# Patient Record
Sex: Female | Born: 1935
Health system: Southern US, Community
[De-identification: ages and names within clinical notes are randomized; demographics above are authoritative.]

## PROBLEM LIST (undated history)

## (undated) DIAGNOSIS — E039 Hypothyroidism, unspecified: Secondary | ICD-10-CM

## (undated) DIAGNOSIS — C55 Malignant neoplasm of uterus, part unspecified: Secondary | ICD-10-CM

## (undated) DIAGNOSIS — K219 Gastro-esophageal reflux disease without esophagitis: Secondary | ICD-10-CM

## (undated) DIAGNOSIS — J189 Pneumonia, unspecified organism: Secondary | ICD-10-CM

## (undated) DIAGNOSIS — K635 Polyp of colon: Secondary | ICD-10-CM

## (undated) DIAGNOSIS — Z923 Personal history of irradiation: Secondary | ICD-10-CM

## (undated) HISTORY — DX: Gastro-esophageal reflux disease without esophagitis: K21.9

## (undated) HISTORY — DX: Polyp of colon: K63.5

## (undated) HISTORY — PX: THYROID SURGERY: SHX805

## (undated) HISTORY — DX: Malignant neoplasm of uterus, part unspecified: C55

## (undated) HISTORY — PX: BREAST LUMPECTOMY: SHX2

## (undated) HISTORY — PX: COLONOSCOPY: SHX174

## (undated) HISTORY — PX: HEMORRHOID SURGERY: SHX153

## (undated) HISTORY — DX: Hypothyroidism, unspecified: E03.9

## (undated) HISTORY — PX: EYE SURGERY: SHX253

## (undated) HISTORY — DX: Personal history of irradiation: Z92.3

## (undated) HISTORY — DX: Pneumonia, unspecified organism: J18.9

---

## 1987-08-18 HISTORY — PX: HYSTERECTOMY ABDOMINAL WITH SALPINGECTOMY: SHX6725

## 1999-12-23 ENCOUNTER — Other Ambulatory Visit: Admission: RE | Admit: 1999-12-23 | Discharge: 1999-12-23 | Payer: Self-pay | Admitting: Obstetrics and Gynecology

## 2003-08-01 ENCOUNTER — Emergency Department (HOSPITAL_COMMUNITY): Admission: EM | Admit: 2003-08-01 | Discharge: 2003-08-01 | Payer: Self-pay | Admitting: Emergency Medicine

## 2004-02-07 ENCOUNTER — Encounter: Admission: RE | Admit: 2004-02-07 | Discharge: 2004-02-07 | Payer: Self-pay | Admitting: Family Medicine

## 2004-09-15 ENCOUNTER — Observation Stay (HOSPITAL_COMMUNITY): Admission: EM | Admit: 2004-09-15 | Discharge: 2004-09-16 | Payer: Self-pay | Admitting: Emergency Medicine

## 2011-05-18 ENCOUNTER — Encounter: Payer: Self-pay | Admitting: Gastroenterology

## 2011-05-21 ENCOUNTER — Ambulatory Visit (INDEPENDENT_AMBULATORY_CARE_PROVIDER_SITE_OTHER): Payer: Medicare Other | Admitting: Gastroenterology

## 2011-05-21 ENCOUNTER — Encounter: Payer: Self-pay | Admitting: Gastroenterology

## 2011-05-21 VITALS — BP 126/70 | HR 72 | Ht 65.0 in | Wt 148.4 lb

## 2011-05-21 DIAGNOSIS — R1310 Dysphagia, unspecified: Secondary | ICD-10-CM

## 2011-05-21 DIAGNOSIS — R197 Diarrhea, unspecified: Secondary | ICD-10-CM

## 2011-05-21 MED ORDER — PEG-KCL-NACL-NASULF-NA ASC-C 100 G PO SOLR: 1.0000 | Freq: Once | ORAL | Status: DC

## 2011-05-21 NOTE — Patient Instructions (Signed)
Endo/Colon Propoful scheduled for 05/25/11 1:30 pm arrive at 12:30 pm on 4th floor [facility] sent to your pharmacy

## 2011-05-21 NOTE — Progress Notes (Signed)
History of Present Illness:  This is a very pleasant 77 year old Caucasian female accompanied by her daughter Harriett Sine today for her interview and exam. This patient is referred from primary care for evaluation of 6 weeks of crampy lower abdominal pain with watery diarrhea but no incontinency, melena, or hematochezia. She has had increased abdominal gas and bloating but no systemic or hepatobiliary symptoms. The patient is being treated for chronic acid reflux with Prilosec 20 mg a day. She has new onset intermittent solid food dysphagia with nausea and vomiting. I have  done several colonoscopies on this patient since 1998 because of recurrent colonic polyposis. Her most recent exam is still in medical records and has been requested. Despite these complaints she's had no anorexia but has lost 8-10 pounds. She denies systemic complaints such as fever, chills, skin rashes, arthritis, or mouth sores. There is no history of Raynaud's phenomenon. I do not think she has had previous endoscopic exam. She does not use alcohol, cigarettes, or NSAIDs. Patient otherwise is in good health except for mild osteoporosis and glaucoma. She apparently has a very sensitive gut allergy to seafood. She denies any symptoms suggestive of enteric infection.Review of lab data from primary care shows normal CBC , metabolic profile, and negative stool culture and negative C. difficile toxin. The patient denies use of antibiotics in the last several years. She has had no history of foreign travel, and there are no sick family members at home.  I have reviewed this patient's present history, medical and surgical past history, allergies and medications.    Past Medical History  Diagnosis Date  . Acid reflux   . Hypothyroidism   . Uterine cancer   . Glaucoma   . Colon polyps   . Pneumonia    Past Surgical History  Procedure Date  . Thyroid surgery     growth removed  . Hemorrhoid surgery   . Cesarean section   . Eye surgery    detached retina  . Breast lumpectomy     2 lumps removed from rt breast    reports that she has never smoked. She has never used smokeless tobacco. She reports that she does not drink alcohol or use illicit drugs. family history includes Heart attack in her brother, mother, and sister and Stroke in her father. Allergies  Allergen Reactions  . Shellfish Allergy   . Sulfa Antibiotics        ROS: The remainder of the 10 point ROS is negative     Physical Exam: General well developed well nourished patient in no acute distress, appearing her stated age Eyes PERRLA, no icterus, fundoscopic exam per opthamologist Skin no lesions noted Neck supple, no adenopathy, no thyroid enlargement, no tenderness Chest clear to percussion and auscultation Heart no significant murmurs, gallops or rubs noted Abdomen no hepatosplenomegaly masses or tenderness, BS normal.  Extremities no acute joint lesions, edema, phlebitis or evidence of cellulitis. Neurologic patient oriented x 3, cranial nerves intact, no focal neurologic deficits noted. Psychological mental status normal and normal affect.  Assessment and plan: Probable microscopic-collagenous colitis an elderly Caucasian female with otherwise negative workup. I have scheduled colonoscopy with random biopsies with propofol sedation because of her age. Also as scheduled endoscopic exam with small bowel biopsy and possible esophageal dilatation per her chronic GERD and probable esophageal stricture. I have changed her to Dexilant 60 mg every morning with standard antireflux maneuvers. With her history of colonic polyposis, this also would be indication for followup colonoscopy. If  her workup otherwise is negative, we will persue  malabsorption evaluation. As part of this workup we will exclude occult celiac disease. The patient is of [place] descent. Encounter Diagnoses  Name Primary?  . Diarrhea Yes  . Dysphagia

## 2011-05-25 ENCOUNTER — Encounter: Payer: Self-pay | Admitting: Gastroenterology

## 2011-05-25 ENCOUNTER — Ambulatory Visit (AMBULATORY_SURGERY_CENTER): Payer: Medicare Other | Admitting: Gastroenterology

## 2011-05-25 DIAGNOSIS — Z1211 Encounter for screening for malignant neoplasm of colon: Secondary | ICD-10-CM

## 2011-05-25 DIAGNOSIS — K219 Gastro-esophageal reflux disease without esophagitis: Secondary | ICD-10-CM

## 2011-05-25 DIAGNOSIS — K449 Diaphragmatic hernia without obstruction or gangrene: Secondary | ICD-10-CM | POA: Insufficient documentation

## 2011-05-25 DIAGNOSIS — R197 Diarrhea, unspecified: Secondary | ICD-10-CM

## 2011-05-25 DIAGNOSIS — R1310 Dysphagia, unspecified: Secondary | ICD-10-CM

## 2011-05-25 DIAGNOSIS — K222 Esophageal obstruction: Secondary | ICD-10-CM

## 2011-05-25 MED ORDER — SODIUM CHLORIDE 0.9 % IV SOLN: 500.0000 mL | INTRAVENOUS | Status: DC

## 2011-05-25 NOTE — Patient Instructions (Signed)
Green and blue discharge instructions reviewed with patient and care partner.  Impressions/recommendations:  Endoscopy:  Polyps Hiatal hernia (handout given) Chronic GERD (handout given) Peptic stricture dialated (handout given)  Colonoscopy:  Diverticulosis (handout given) High fiber diet (handout given) Hemorrhoids (handout given)  Resume medications as you had been taking them prior to your procedure.

## 2011-05-26 ENCOUNTER — Telehealth: Payer: Self-pay

## 2011-05-26 ENCOUNTER — Encounter: Payer: Self-pay | Admitting: Gastroenterology

## 2011-05-26 NOTE — Telephone Encounter (Signed)

## 2011-08-14 ENCOUNTER — Other Ambulatory Visit: Payer: Self-pay | Admitting: Dermatology

## 2011-10-18 DIAGNOSIS — H4050X0 Glaucoma secondary to other eye disorders, unspecified eye, stage unspecified: Secondary | ICD-10-CM | POA: Insufficient documentation

## 2011-10-19 DIAGNOSIS — H4050X0 Glaucoma secondary to other eye disorders, unspecified eye, stage unspecified: Secondary | ICD-10-CM | POA: Diagnosis not present

## 2011-12-03 DIAGNOSIS — M719 Bursopathy, unspecified: Secondary | ICD-10-CM | POA: Diagnosis not present

## 2011-12-03 DIAGNOSIS — IMO0002 Reserved for concepts with insufficient information to code with codable children: Secondary | ICD-10-CM | POA: Diagnosis not present

## 2012-02-25 DIAGNOSIS — R42 Dizziness and giddiness: Secondary | ICD-10-CM | POA: Diagnosis not present

## 2012-02-25 DIAGNOSIS — E039 Hypothyroidism, unspecified: Secondary | ICD-10-CM | POA: Diagnosis not present

## 2012-02-25 DIAGNOSIS — K219 Gastro-esophageal reflux disease without esophagitis: Secondary | ICD-10-CM | POA: Diagnosis not present

## 2012-02-25 DIAGNOSIS — M159 Polyosteoarthritis, unspecified: Secondary | ICD-10-CM | POA: Diagnosis not present

## 2012-04-12 DIAGNOSIS — Z8669 Personal history of other diseases of the nervous system and sense organs: Secondary | ICD-10-CM | POA: Insufficient documentation

## 2012-04-12 DIAGNOSIS — H4050X0 Glaucoma secondary to other eye disorders, unspecified eye, stage unspecified: Secondary | ICD-10-CM | POA: Diagnosis not present

## 2012-04-12 DIAGNOSIS — Z9889 Other specified postprocedural states: Secondary | ICD-10-CM | POA: Diagnosis not present

## 2012-05-14 DIAGNOSIS — Z23 Encounter for immunization: Secondary | ICD-10-CM | POA: Diagnosis not present

## 2012-05-24 DIAGNOSIS — M25559 Pain in unspecified hip: Secondary | ICD-10-CM | POA: Diagnosis not present

## 2012-05-24 DIAGNOSIS — M8448XA Pathological fracture, other site, initial encounter for fracture: Secondary | ICD-10-CM | POA: Diagnosis not present

## 2012-10-17 DIAGNOSIS — H4050X0 Glaucoma secondary to other eye disorders, unspecified eye, stage unspecified: Secondary | ICD-10-CM | POA: Diagnosis not present

## 2012-10-17 DIAGNOSIS — Z9889 Other specified postprocedural states: Secondary | ICD-10-CM | POA: Diagnosis not present

## 2013-02-27 DIAGNOSIS — J309 Allergic rhinitis, unspecified: Secondary | ICD-10-CM | POA: Diagnosis not present

## 2013-02-27 DIAGNOSIS — R42 Dizziness and giddiness: Secondary | ICD-10-CM | POA: Diagnosis not present

## 2013-02-27 DIAGNOSIS — E039 Hypothyroidism, unspecified: Secondary | ICD-10-CM | POA: Diagnosis not present

## 2013-02-27 DIAGNOSIS — K219 Gastro-esophageal reflux disease without esophagitis: Secondary | ICD-10-CM | POA: Diagnosis not present

## 2013-02-27 DIAGNOSIS — M159 Polyosteoarthritis, unspecified: Secondary | ICD-10-CM | POA: Diagnosis not present

## 2013-03-05 ENCOUNTER — Emergency Department (HOSPITAL_COMMUNITY): Payer: Medicare Other

## 2013-03-05 ENCOUNTER — Inpatient Hospital Stay (HOSPITAL_COMMUNITY)
Admission: EM | Admit: 2013-03-05 | Discharge: 2013-03-06 | DRG: 690 | Disposition: A | Discharge status: Home / Self Care | Payer: Medicare Other | Attending: Internal Medicine | Admitting: Internal Medicine

## 2013-03-05 ENCOUNTER — Encounter (HOSPITAL_COMMUNITY): Payer: Self-pay | Admitting: Physical Medicine and Rehabilitation

## 2013-03-05 DIAGNOSIS — Z66 Do not resuscitate: Secondary | ICD-10-CM | POA: Diagnosis present

## 2013-03-05 DIAGNOSIS — R5381 Other malaise: Secondary | ICD-10-CM | POA: Diagnosis not present

## 2013-03-05 DIAGNOSIS — J189 Pneumonia, unspecified organism: Secondary | ICD-10-CM

## 2013-03-05 DIAGNOSIS — Z91013 Allergy to seafood: Secondary | ICD-10-CM | POA: Diagnosis not present

## 2013-03-05 DIAGNOSIS — R079 Chest pain, unspecified: Secondary | ICD-10-CM | POA: Diagnosis not present

## 2013-03-05 DIAGNOSIS — H409 Unspecified glaucoma: Secondary | ICD-10-CM | POA: Diagnosis not present

## 2013-03-05 DIAGNOSIS — K219 Gastro-esophageal reflux disease without esophagitis: Secondary | ICD-10-CM | POA: Diagnosis not present

## 2013-03-05 DIAGNOSIS — E039 Hypothyroidism, unspecified: Secondary | ICD-10-CM | POA: Diagnosis present

## 2013-03-05 DIAGNOSIS — R5383 Other fatigue: Secondary | ICD-10-CM | POA: Diagnosis not present

## 2013-03-05 DIAGNOSIS — Z8249 Family history of ischemic heart disease and other diseases of the circulatory system: Secondary | ICD-10-CM | POA: Diagnosis not present

## 2013-03-05 DIAGNOSIS — R269 Unspecified abnormalities of gait and mobility: Secondary | ICD-10-CM | POA: Diagnosis present

## 2013-03-05 DIAGNOSIS — Z8542 Personal history of malignant neoplasm of other parts of uterus: Secondary | ICD-10-CM | POA: Diagnosis not present

## 2013-03-05 DIAGNOSIS — Z79899 Other long term (current) drug therapy: Secondary | ICD-10-CM

## 2013-03-05 DIAGNOSIS — N390 Urinary tract infection, site not specified: Principal | ICD-10-CM | POA: Diagnosis present

## 2013-03-05 DIAGNOSIS — Z823 Family history of stroke: Secondary | ICD-10-CM | POA: Diagnosis not present

## 2013-03-05 DIAGNOSIS — R55 Syncope and collapse: Secondary | ICD-10-CM | POA: Diagnosis not present

## 2013-03-05 DIAGNOSIS — R0989 Other specified symptoms and signs involving the circulatory and respiratory systems: Secondary | ICD-10-CM | POA: Diagnosis not present

## 2013-03-05 LAB — CBC WITH DIFFERENTIAL/PLATELET
Basophils Relative: 0 % (ref 0–1)
Eosinophils Absolute: 0.1 10*3/uL (ref 0.0–0.7)
Eosinophils Relative: 2 % (ref 0–5)
Hemoglobin: 14.3 g/dL (ref 12.0–15.0)
MCH: 33.1 pg (ref 26.0–34.0)
MCHC: 34.8 g/dL (ref 30.0–36.0)
MCV: 95.1 fL (ref 78.0–100.0)
Monocytes Absolute: 0.6 10*3/uL (ref 0.1–1.0)
Monocytes Relative: 12 % (ref 3–12)
Neutrophils Relative %: 66 % (ref 43–77)

## 2013-03-05 LAB — URINALYSIS, ROUTINE W REFLEX MICROSCOPIC
Glucose, UA: NEGATIVE mg/dL
Nitrite: NEGATIVE
Specific Gravity, Urine: 1.007 (ref 1.005–1.030)
pH: 6.5 (ref 5.0–8.0)

## 2013-03-05 LAB — COMPREHENSIVE METABOLIC PANEL
Albumin: 3.6 g/dL (ref 3.5–5.2)
BUN: 15 mg/dL (ref 6–23)
Calcium: 9.2 mg/dL (ref 8.4–10.5)
Creatinine, Ser: 0.77 mg/dL (ref 0.50–1.10)
GFR calc Af Amer: 85 mL/min — ABNORMAL LOW (ref >90)
Glucose, Bld: 105 mg/dL — ABNORMAL HIGH (ref 70–99)
Potassium: 4.2 mEq/L (ref 3.5–5.1)
Total Protein: 6.6 g/dL (ref 6.0–8.3)

## 2013-03-05 LAB — PRO B NATRIURETIC PEPTIDE: Pro B Natriuretic peptide (BNP): 141.8 pg/mL (ref 0–450)

## 2013-03-05 LAB — URINE MICROSCOPIC-ADD ON

## 2013-03-05 LAB — POCT I-STAT TROPONIN I: Troponin i, poc: 0 ng/mL (ref 0.00–0.08)

## 2013-03-05 MED ORDER — CALTRATE 600+D PLUS MINERALS 600-800 MG-UNIT PO TABS: 1.0000 | ORAL_TABLET | Freq: Every day | ORAL | Status: DC

## 2013-03-05 MED ORDER — TRAVOPROST 0.004 % OP SOLN: 1.0000 [drp] | Freq: Every day | OPHTHALMIC | Status: DC

## 2013-03-05 MED ORDER — ENOXAPARIN SODIUM 30 MG/0.3ML ~~LOC~~ SOLN
30.0000 mg | SUBCUTANEOUS | Status: DC
  Administered 2013-03-05: 30 mg via SUBCUTANEOUS
  Filled 2013-03-05 (×2): qty 0.3

## 2013-03-05 MED ORDER — DEXTROSE 5 % IV SOLN
1.0000 g | INTRAVENOUS | Status: DC
  Administered 2013-03-06: 1 g via INTRAVENOUS
  Filled 2013-03-05: qty 10

## 2013-03-05 MED ORDER — DEXTROSE 5 % IV SOLN
1.0000 g | INTRAVENOUS | Status: DC
  Administered 2013-03-05: 12:00:00 via INTRAVENOUS
  Filled 2013-03-05: qty 10

## 2013-03-05 MED ORDER — CALCIUM CITRATE-VITAMIN D 500-400 MG-UNIT PO CHEW
1.0000 | CHEWABLE_TABLET | Freq: Every day | ORAL | Status: DC
  Filled 2013-03-05: qty 1

## 2013-03-05 MED ORDER — SODIUM CHLORIDE 0.9 % IV SOLN
INTRAVENOUS | Status: DC
  Administered 2013-03-06: via INTRAVENOUS

## 2013-03-05 MED ORDER — TRAVOPROST (BAK FREE) 0.004 % OP SOLN
1.0000 [drp] | Freq: Every day | OPHTHALMIC | Status: DC
  Administered 2013-03-05: 1 [drp] via OPHTHALMIC
  Filled 2013-03-05: qty 2.5

## 2013-03-05 MED ORDER — OMEPRAZOLE MAGNESIUM 20 MG PO TBEC: 20.0000 mg | DELAYED_RELEASE_TABLET | Freq: Every day | ORAL | Status: DC

## 2013-03-05 MED ORDER — OMEPRAZOLE 20 MG PO CPDR
20.0000 mg | DELAYED_RELEASE_CAPSULE | Freq: Every day | ORAL | Status: DC
  Administered 2013-03-06: 20 mg via ORAL
  Filled 2013-03-05: qty 1

## 2013-03-05 MED ORDER — ALUM & MAG HYDROXIDE-SIMETH 200-200-20 MG/5ML PO SUSP: 30.0000 mL | Freq: Four times a day (QID) | ORAL | Status: DC | PRN

## 2013-03-05 MED ORDER — SODIUM CHLORIDE 0.9 % IV SOLN
INTRAVENOUS | Status: DC
  Administered 2013-03-05: 11:00:00 via INTRAVENOUS

## 2013-03-05 MED ORDER — DEXTROSE 5 % IV SOLN
500.0000 mg | INTRAVENOUS | Status: DC
  Administered 2013-03-05: 13:00:00 via INTRAVENOUS
  Filled 2013-03-05: qty 500

## 2013-03-05 MED ORDER — ONDANSETRON HCL 4 MG/2ML IJ SOLN: 4.0000 mg | Freq: Four times a day (QID) | INTRAMUSCULAR | Status: DC | PRN

## 2013-03-05 MED ORDER — LEVOTHYROXINE SODIUM 50 MCG PO TABS
50.0000 ug | ORAL_TABLET | Freq: Every day | ORAL | Status: DC
  Administered 2013-03-06: 50 ug via ORAL
  Filled 2013-03-05 (×2): qty 1

## 2013-03-05 MED ORDER — SODIUM CHLORIDE 0.9 % IV SOLN: INTRAVENOUS | Status: DC

## 2013-03-05 MED ORDER — AZITHROMYCIN 500 MG IV SOLR
500.0000 mg | INTRAVENOUS | Status: DC
  Filled 2013-03-05: qty 500

## 2013-03-05 NOTE — H&P (Signed)
[facility] History and Physical  Tanya Martinez GEX:[id number] DOB: 1923-04-15 DOA: 03/05/2013  Referring physician: EDP PCP: Benita Stabile, MD   Chief Complaint: weak, dizziness  HPI: Tanya Martinez is a 79 y.o. female with h/o hypothyroidism, GERD, Glaucoma was in his usual state of health till this morning. She woke up this morning, just didn't feel right, felt weak and dizzy. This was followed by feeling of nausea and sweating and then came to the ER. In addition she also reports urinary urgency and frequency for last couple of days. Upon evaluation in ER noted to have an abnormal UA and CXR with ? Airspace opacities with chronic interstitial lung disease. Denies any cough, congestion, or shortness of breath.    Review of Systems: The patient denies anorexia, fever, weight loss,, vision loss, decreased hearing, hoarseness, chest pain, syncope, dyspnea on exertion, peripheral edema, balance deficits, hemoptysis, abdominal pain, melena, hematochezia, severe indigestion/heartburn, hematuria, incontinence, genital sores, muscle weakness, suspicious skin lesions, transient blindness, difficulty walking, depression, unusual weight change, abnormal bleeding, enlarged lymph nodes, angioedema, and breast masses.    Past Medical History  Diagnosis Date  . Acid reflux   . Hypothyroidism   . Uterine cancer   . Glaucoma   . Colon polyps   . Pneumonia    Past Surgical History  Procedure Laterality Date  . Thyroid surgery      growth removed  . Hemorrhoid surgery    . Cesarean section    . Eye surgery      detached retina  . Breast lumpectomy      2 lumps removed from rt breast  . Colonoscopy     Social History:  reports that she has never smoked. She has never used smokeless tobacco. She reports that  drinks alcohol. She reports that she does not use illicit drugs. Lives by herself, indepenednt in [facility]  Allergies  Allergen Reactions  . Shellfish Allergy Nausea Only  .  Sulfa Antibiotics Other (See Comments)    "Lips felt numb"    Family History  Problem Relation Age of Onset  . Heart attack Mother   . Heart disease Mother   . Heart attack Brother   . Heart disease Brother   . Heart attack Sister   . Heart disease Sister   . Stroke Father     Prior to Admission medications   Medication Sig Start Date End Date Taking? Authorizing Provider  Calcium Carbonate-Vit D-Min (CALTRATE 600+D PLUS MINERALS) 600-800 MG-UNIT TABS Take 1 tablet by mouth daily at 12 noon.   Yes Historical Provider, MD  levothyroxine (SYNTHROID, LEVOTHROID) 50 MCG tablet Take 50 mcg by mouth daily.     Yes Historical Provider, MD  omeprazole (PRILOSEC OTC) 20 MG tablet Take 20 mg by mouth daily.   Yes Historical Provider, MD  travoprost, benzalkonium, (TRAVATAN) 0.004 % ophthalmic solution Apply 1 drop to eye at bedtime.    Yes Historical Provider, MD   Physical Exam: Filed Vitals:   03/05/13 1055 03/05/13 1057 03/05/13 1202 03/05/13 1328  BP: 126/58 120/58 132/52 129/65  Pulse: 70 71 64 68  Temp:    98.2 F (36.8 C)  TempSrc:    Oral  Resp: 20 15 16 20   Height:    5\' 3"  (1.6 m)  Weight:    70.217 kg (154 lb 12.8 oz)  SpO2: 99% 98% 100% 99%     General: AAOx3, no ndistress  HEENT: PERRLA, EOMI  CVS: S!S2/RRR  Lungs: ctAB  Abd: soft, Nt,  BS present, no flank tenderness  Skin: no rashes or skin breakdown  Musculoskeletal: no edema c/c  Psychiatric: appropriate mood and affecr  Neurologic:non focal  Labs on Admission:  Basic Metabolic Panel:  Recent Labs Lab 03/05/13 1035  NA 138  K 4.2  CL 102  CO2 26  GLUCOSE 105*  BUN 15  CREATININE 0.77  CALCIUM 9.2   Liver Function Tests:  Recent Labs Lab 03/05/13 1035  AST 23  ALT 12  ALKPHOS 48  BILITOT 0.7  PROT 6.6  ALBUMIN 3.6   No results found for this basename: LIPASE, AMYLASE,  in the last 168 hours No results found for this basename: AMMONIA,  in the last 168 hours CBC:  Recent  Labs Lab 03/05/13 1035  WBC 5.1  NEUTROABS 3.4  HGB 14.3  HCT 41.1  MCV 95.1  PLT 171   Cardiac Enzymes: No results found for this basename: CKTOTAL, CKMB, CKMBINDEX, TROPONINI,  in the last 168 hours  BNP (last 3 results)  Recent Labs  03/05/13 1044  PROBNP 141.8   CBG:  Recent Labs Lab 03/05/13 1045  GLUCAP 92    Radiological Exams on Admission: Dg Chest 2 View  03/05/2013   *RADIOLOGY REPORT*  Clinical Data: Pain and dizziness  CHEST - 2 VIEW  Comparison: 09/15/2006  Findings: Heart size appears normal.  There is no pleural effusion identified.  Chronic interstitial coarsening is noted bilaterally. Superimposed interstitial opacities are identified within both lung bases.  IMPRESSION:  1.  Advanced chronic interstitial lung disease. 2.  Suspect superimposed airspace opacities within both lung bases.   Original Report Authenticated By: Signa Kell, M.D.    EKG: Independently reviewed. pending  Assessment/Plan Active Problems:   GERD with stricture   UTI (urinary tract infection)   Pneumonia   1. UTI -start IV Ceftriaxone -Fu urine Cultures -gentle IVF  2. Abnormal CXR -no s/s of pneumonia at this time, but given abnormal CXR and exam will empirically treat with Iv ceftriaxone and azithromycin -repeat CXr in am -check Urine legionella and pneumococcal Ag -chronic interstitial lung disease noted on CXR in 2006 as well  3. GERd: -continue PPI  4. Hypothyroidism -continue synthroid  5. Dizziness/weakness -likely from 1/2 -hydrate, monitor on tele and check EKG   Code Status: DNR Family Communication: d/w pt and daughter at bedside Disposition Plan: home when improved  Time spent:  [facility] Pager [phone]  If 7PM-7AM, please contact night-coverage [email or web address] Password [facility] 03/05/2013, 4:47 PM

## 2013-03-05 NOTE — ED Notes (Signed)
Pt presents to department for evaluation of dizziness and near syncope. States she was at home this morning when she became dizzy, diaphoretic, SOB and weak. States she grabbed onto bed railing and sat on bed. States "I felt like I was being pulled down." denies chest pain. Respirations unlabored. Pt is conscious alert and oriented x4.

## 2013-03-05 NOTE — ED Provider Notes (Signed)
History    CSN: [id number] Arrival date & time 03/05/13  1011  First MD Initiated Contact with Patient 03/05/13 1029     Chief Complaint  Patient presents with  . Dizziness  . Near Syncope   (Consider location/radiation/quality/duration/timing/severity/associated sxs/prior Treatment) The history is provided by the patient.   patient here complaining of sudden onset of dizziness and weakness today. Notes near syncope. Denies any recent illnesses. No associated chest pain chest pressure. Some dyspnea without abdominal pain. No black or bloody stools. No prior history of same. Symptoms worse with standing. No new medication started. No prior history of same. No treatment used prior to arrival Past Medical History  Diagnosis Date  . Acid reflux   . Hypothyroidism   . Uterine cancer   . Glaucoma   . Colon polyps   . Pneumonia    Past Surgical History  Procedure Laterality Date  . Thyroid surgery      growth removed  . Hemorrhoid surgery    . Cesarean section    . Eye surgery      detached retina  . Breast lumpectomy      2 lumps removed from rt breast  . Colonoscopy     Family History  Problem Relation Age of Onset  . Heart attack Mother   . Heart disease Mother   . Heart attack Brother   . Heart disease Brother   . Heart attack Sister   . Heart disease Sister   . Stroke Father    History  Substance Use Topics  . Smoking status: Never Smoker   . Smokeless tobacco: Never Used  . Alcohol Use: Yes     Comment: occasionally   OB History   Grav Para Term Preterm Abortions TAB SAB Ect Mult Living                 Review of Systems  All other systems reviewed and are negative.    Allergies  Shellfish allergy and Sulfa antibiotics  Home Medications   Current Outpatient Rx  Name  Route  Sig  Dispense  Refill  . Calcium Carbonate-Vitamin D (CALCIUM 600+D) 600-400 MG-UNIT per tablet   Oral   Take 1 tablet by mouth daily.           Marland Kitchen levothyroxine  (SYNTHROID, LEVOTHROID) 50 MCG tablet   Oral   Take 50 mcg by mouth daily.           Marland Kitchen omeprazole (PRILOSEC) 20 MG capsule   Oral   Take 20 mg by mouth daily.           . travoprost, benzalkonium, (TRAVATAN) 0.004 % ophthalmic solution   Ophthalmic   Apply 1 drop to eye at bedtime.            There were no vitals taken for this visit. Physical Exam  Nursing note and vitals reviewed. Constitutional: She is oriented to person, place, and time. She appears well-developed and well-nourished.  Non-toxic appearance. No distress.  HENT:  Head: Normocephalic and atraumatic.  Eyes: Conjunctivae, EOM and lids are normal. Pupils are equal, round, and reactive to light.  Neck: Normal range of motion. Neck supple. No tracheal deviation present. No mass present.  Cardiovascular: Normal rate, regular rhythm and normal heart sounds.  Exam reveals no gallop.   No murmur heard. Pulmonary/Chest: Effort normal and breath sounds normal. No stridor. No respiratory distress. She has no decreased breath sounds. She has no wheezes. She has no rhonchi.  She has no rales.  Abdominal: Soft. Normal appearance and bowel sounds are normal. She exhibits no distension. There is no tenderness. There is no rebound and no CVA tenderness.  Musculoskeletal: Normal range of motion. She exhibits no edema and no tenderness.  Neurological: She is alert and oriented to person, place, and time. She has normal strength. No cranial nerve deficit or sensory deficit. GCS eye subscore is 4. GCS verbal subscore is 5. GCS motor subscore is 6.  Skin: Skin is warm and dry. No abrasion and no rash noted.  Psychiatric: She has a normal mood and affect. Her speech is normal and behavior is normal.    ED Course  Procedures (including critical care time) Labs Reviewed  CBC WITH DIFFERENTIAL  COMPREHENSIVE METABOLIC PANEL  URINALYSIS, ROUTINE W REFLEX MICROSCOPIC  PRO B NATRIURETIC PEPTIDE   No results found. No diagnosis  found.  MDM  Pt to be admitted for tx of pneumonia and uti--abx started here  Toy Baker, MD 03/05/13 1218

## 2013-03-05 NOTE — ED Notes (Signed)
CBG- 92 

## 2013-03-06 ENCOUNTER — Inpatient Hospital Stay (HOSPITAL_COMMUNITY): Payer: Medicare Other

## 2013-03-06 DIAGNOSIS — K219 Gastro-esophageal reflux disease without esophagitis: Secondary | ICD-10-CM | POA: Diagnosis not present

## 2013-03-06 DIAGNOSIS — N390 Urinary tract infection, site not specified: Secondary | ICD-10-CM | POA: Diagnosis not present

## 2013-03-06 DIAGNOSIS — R0989 Other specified symptoms and signs involving the circulatory and respiratory systems: Secondary | ICD-10-CM | POA: Diagnosis not present

## 2013-03-06 LAB — CBC
HCT: 37.7 % (ref 36.0–46.0)
Hemoglobin: 12.7 g/dL (ref 12.0–15.0)
MCH: 32.5 pg (ref 26.0–34.0)
MCV: 96.4 fL (ref 78.0–100.0)
RBC: 3.91 MIL/uL (ref 3.87–5.11)

## 2013-03-06 LAB — BASIC METABOLIC PANEL
CO2: 27 mEq/L (ref 19–32)
Calcium: 8.6 mg/dL (ref 8.4–10.5)
Creatinine, Ser: 0.78 mg/dL (ref 0.50–1.10)
Glucose, Bld: 91 mg/dL (ref 70–99)

## 2013-03-06 MED ORDER — ENOXAPARIN SODIUM 40 MG/0.4ML ~~LOC~~ SOLN
40.0000 mg | Freq: Every day | SUBCUTANEOUS | Status: DC
  Administered 2013-03-06: 40 mg via SUBCUTANEOUS
  Filled 2013-03-06: qty 0.4

## 2013-03-06 MED ORDER — CEFPODOXIME PROXETIL 100 MG PO TABS: 200.0000 mg | ORAL_TABLET | Freq: Two times a day (BID) | ORAL | Status: DC

## 2013-03-06 MED ORDER — CALCIUM CARBONATE-VITAMIN D 500-200 MG-UNIT PO TABS
1.0000 | ORAL_TABLET | Freq: Every day | ORAL | Status: DC
  Administered 2013-03-06: 1 via ORAL

## 2013-03-06 MED ORDER — CEFUROXIME AXETIL 250 MG PO TABS: 250.0000 mg | ORAL_TABLET | Freq: Two times a day (BID) | ORAL | Status: DC

## 2013-03-06 NOTE — Discharge Summary (Addendum)
Physician Discharge Summary  [name] AVW:[id number] DOB: [birth year] DOA: 03/05/2013  PCP: Benita Stabile, MD  Admit date: 03/05/2013 Discharge date: 03/06/2013  Time spent: 45 minutes  Recommendations for Outpatient Follow-up:  1. PCP in one week  Discharge Diagnoses:  Active Problems:   GERD with stricture   UTI (urinary tract infection)   Glaucoma   History of gait disorder   Discharge Condition: Stable  Diet recommendation: Regular  Filed Weights   03/05/13 1328  Weight: 70.217 kg (154 lb 12.8 oz)    History of present illness:  Tanya Martinez is a 79 y.o. female with h/o hypothyroidism, GERD, Glaucoma was in his usual state of health till this morning.  She woke up this morning, just didn't feel right, felt weak and dizzy.  This was followed by feeling of nausea and sweating and then came to the ER.  In addition she also reports urinary urgency and frequency for last couple of days.  Upon evaluation in ER noted to have an abnormal UA and CXR with ? Airspace opacities with chronic interstitial lung disease.  Denies any cough, congestion, or shortness of breath.  Hospital Course:  She was admitted to a telemetry floor, started on IV ceftriaxone, azithromycin was added initially due to concern for potential pneumonia on chest x-ray. Subsequent x-ray the next day only showed chronic lung changes which were stable and hence azithromycin was discontinued. She was clinically much improved within 24 hours, urine cultures are still pending at this point, was seen in fluid with physical therapy, discharged home in a stable condition on oral cefpodoxime for UTI. In terms of her chronic stable interstitial lung disease, but has been noted on prior x-rays dating back to 2006, not causing any symptoms at this point,No history of tobacco use. Would not recommend any further workup unless she becomes symptomatic, will follow up chest x-ray in 3 months.  Did not have any arrhythmias  or any abnormal rhythms and monitor or EKG.    Discharge Exam: Filed Vitals:   03/05/13 1328 03/05/13 2129 03/06/13 0536 03/06/13 1407  BP: 129/65 111/55 131/60 113/63  Pulse: 68 62 56 68  Temp: 98.2 F (36.8 C) 98.5 F (36.9 C) 98.1 F (36.7 C) 97.7 F (36.5 C)  TempSrc: Oral Oral Oral Oral  Resp: 20 18 18 20   Height: 5\' 3"  (1.6 m)     Weight: 70.217 kg (154 lb 12.8 oz)     SpO2: 99% 96% 97% 99%    General: AAOx3 Cardiovascular: S1-S2 regular rate rhythm no murmurs rubs or gallops Respiratory: Crackles bilaterally  Discharge Instructions     Medication List         CALTRATE 600+D PLUS MINERALS 600-800 MG-UNIT Tabs  Take 1 tablet by mouth daily at 12 noon.     cefUROXime 250 MG tablet  Commonly known as:  CEFTIN  Take 1 tablet (250 mg total) by mouth 2 (two) times daily. For 3 days     levothyroxine 50 MCG tablet  Commonly known as:  SYNTHROID, LEVOTHROID  Take 50 mcg by mouth daily.     omeprazole 20 MG tablet  Commonly known as:  PRILOSEC OTC  Take 20 mg by mouth daily.     travoprost (benzalkonium) 0.004 % ophthalmic solution  Commonly known as:  TRAVATAN  Apply 1 drop to eye at bedtime.       Allergies  Allergen Reactions  . Shellfish Allergy Nausea Only  . Sulfa Antibiotics Other (See Comments)    "  Lips felt numb"      The results of significant diagnostics from this hospitalization (including imaging, microbiology, ancillary and laboratory) are listed below for reference.    Significant Diagnostic Studies: Dg Chest 2 View  03/06/2013   *RADIOLOGY REPORT*  Clinical Data: Congestion, shortness of breath.  CHEST - 2 VIEW  Comparison: 03/05/2013  Findings: Advanced chronic interstitial changes throughout the lungs.  Probable fibrosis in the lung bases.  Hyperinflation.  No acute opacities or effusions.  Heart is normal size.  Mediastinal contours within normal limits.  No acute bony abnormality.  IMPRESSION: Stable chronic changes.   Original Report  Authenticated By: Charlett Nose, M.D.   Dg Chest 2 View  03/05/2013   *RADIOLOGY REPORT*  Clinical Data: Pain and dizziness  CHEST - 2 VIEW  Comparison: 09/15/2006  Findings: Heart size appears normal.  There is no pleural effusion identified.  Chronic interstitial coarsening is noted bilaterally. Superimposed interstitial opacities are identified within both lung bases.  IMPRESSION:  1.  Advanced chronic interstitial lung disease. 2.  Suspect superimposed airspace opacities within both lung bases.   Original Report Authenticated By: Signa Kell, M.D.    Microbiology: No results found for this or any previous visit (from the past 240 hour(s)).   Labs: Basic Metabolic Panel:  Recent Labs Lab 03/05/13 1035 03/06/13 0535  NA 138 143  K 4.2 3.9  CL 102 108  CO2 26 27  GLUCOSE 105* 91  BUN 15 14  CREATININE 0.77 0.78  CALCIUM 9.2 8.6   Liver Function Tests:  Recent Labs Lab 03/05/13 1035  AST 23  ALT 12  ALKPHOS 48  BILITOT 0.7  PROT 6.6  ALBUMIN 3.6   No results found for this basename: LIPASE, AMYLASE,  in the last 168 hours No results found for this basename: AMMONIA,  in the last 168 hours CBC:  Recent Labs Lab 03/05/13 1035 03/06/13 0535  WBC 5.1 4.8  NEUTROABS 3.4  --   HGB 14.3 12.7  HCT 41.1 37.7  MCV 95.1 96.4  PLT 171 158   Cardiac Enzymes: No results found for this basename: CKTOTAL, CKMB, CKMBINDEX, TROPONINI,  in the last 168 hours BNP: BNP (last 3 results)  Recent Labs  03/05/13 1044  PROBNP 141.8   CBG:  Recent Labs Lab 03/05/13 1045  GLUCAP 92       Signed:  Babette Stum  [facility] 03/06/2013, 2:17 PM

## 2013-03-06 NOTE — Evaluation (Signed)
Physical Therapy Evaluation Patient Details Name: Tanya Martinez MRN: [id number] DOB: [birth year] Today's Date: 03/06/2013 Time: 4098-1191 PT Time Calculation (min): 19 min  PT Assessment / Plan / Recommendation History of Present Illness  Adm due to weakness, nausea, sweating   Clinical Impression  Pt adm due to nausea, dizziness, and weakness. Pt treated for UTI and hydrated. Pt scored 20 on DGI. Is amb at baseline, Mod I at this time. Demo good ability to pick up objects off ground. Pt encouraged to seek out AD if pt notices any unsteadiness or increase in furniture walking at home. Pt and daughter verbalized understanding. Educated family and pt on vestibular OPPT to increase stability if dizziness continues. Pt does not have acute PT needs at this time. Will sign off at this time.   PT Assessment  Patent does not need any further PT services;All further PT needs can be met in the next venue of care    Follow Up Recommendations  Outpatient PT;Other (comment) (vestibular OPPT )    Does the patient have the potential to tolerate intense rehabilitation      Barriers to Discharge        Equipment Recommendations  None recommended by PT    Recommendations for Other Services     Frequency      Precautions / Restrictions Precautions Precautions: None Restrictions Weight Bearing Restrictions: No   Pertinent Vitals/Pain No new complaints.       Mobility  Bed Mobility Bed Mobility: Not assessed (pt sitting EOB. ) Transfers Transfers: Stand to Sit;Sit to Stand Sit to Stand: 6: Modified independent (Device/Increase time);From bed Stand to Sit: 6: Modified independent (Device/Increase time);To bed;To chair/3-in-1 Details for Transfer Assistance: pt demo good technique and safetey awareness with transfer bed <> 3 in 1 <> bed Ambulation/Gait Ambulation/Gait Assistance: 5: Supervision;6: Modified independent (Device/Increase time) Ambulation Distance (Feet): 200 Feet Assistive  device: None Ambulation/Gait Assistance Details: supervision only for safety with dynamic gait activities; pt Mod I for basic gt; demo good stability; states she is walking at her baseline; no AD needed at this time  Gait Pattern: Step-through pattern Gait velocity: WNL  Stairs: Yes Stairs Assistance: 5: Supervision;6: Modified independent (Device/Increase time) Stairs Assistance Details (indicate cue type and reason): pt supervision initally for safety demo good technique with handrail and progressed to mod I Stair Management Technique: One rail Right;Step to pattern;Forwards Number of Stairs: 2 Wheelchair Mobility Wheelchair Mobility: No    Exercises     PT Diagnosis: Difficulty walking  PT Problem List: Decreased balance PT Treatment Interventions:       PT Goals(Current goals can be found in the care plan section)    Visit Information  Last PT Received On: 03/06/13 Assistance Needed: +1 History of Present Illness: Adm due to weakness, nausea, sweating        Prior Functioning  Home Living Family/patient expects to be discharged to:: Private residence Living Arrangements: Alone Available Help at Discharge: Available PRN/intermittently;Family Type of Home: House Home Access: Stairs to enter Secretary/administrator of Steps: 1 Entrance Stairs-Rails: None Home Layout: Two level Alternate Level Stairs-Number of Steps: flight  Alternate Level Stairs-Rails: Right Home Equipment: None Additional Comments: Daughter lives Prior Function Level of Independence: Independent Communication Communication: No difficulties    Cognition  Cognition Arousal/Alertness: Awake/alert Behavior During Therapy: WFL for tasks assessed/performed Overall Cognitive Status: Within Functional Limits for tasks assessed    Extremity/Trunk Assessment Upper Extremity Assessment Upper Extremity Assessment: Overall WFL for tasks assessed Lower Extremity Assessment  Lower Extremity Assessment:  Overall WFL for tasks assessed Cervical / Trunk Assessment Cervical / Trunk Assessment: Normal   Balance Balance Balance Assessed: Yes Static Standing Balance Static Standing - Balance Support: No upper extremity supported;During functional activity Static Standing - Level of Assistance: 6: Modified independent (Device/Increase time) Standardized Balance Assessment Standardized Balance Assessment: Dynamic Gait Index Dynamic Gait Index Level Surface: Normal Change in Gait Speed: Normal Gait with Horizontal Head Turns: Mild Impairment Gait with Vertical Head Turns: Mild Impairment Gait and Pivot Turn: Normal Step Over Obstacle: Mild Impairment Step Around Obstacles: Normal Steps: Mild Impairment Total Score: 20 High Level Balance High Level Balance Activites: Other (comment) High Level Balance Comments: pt able to pick up objects off ground and demo good stability and body mechanics  End of Session PT - End of Session Equipment Utilized During Treatment: Gait belt Activity Tolerance: Patient tolerated treatment well Patient left: in bed;with call bell/phone within reach;with family/visitor present Nurse Communication: Mobility status  GP     Donell Sievert, [place] [phone] 03/06/2013, 1:27 PM

## 2013-03-06 NOTE — Discharge Planning (Signed)
Patient discharged home in stable condition. Verbalizes understanding of all discharge instructions, including home medications and follow up appointments. 

## 2013-03-07 LAB — URINE CULTURE

## 2013-03-14 DIAGNOSIS — R0989 Other specified symptoms and signs involving the circulatory and respiratory systems: Secondary | ICD-10-CM | POA: Diagnosis not present

## 2013-03-14 DIAGNOSIS — N390 Urinary tract infection, site not specified: Secondary | ICD-10-CM | POA: Diagnosis not present

## 2013-03-22 DIAGNOSIS — L259 Unspecified contact dermatitis, unspecified cause: Secondary | ICD-10-CM | POA: Diagnosis not present

## 2013-03-22 DIAGNOSIS — L819 Disorder of pigmentation, unspecified: Secondary | ICD-10-CM | POA: Diagnosis not present

## 2013-03-22 DIAGNOSIS — L570 Actinic keratosis: Secondary | ICD-10-CM | POA: Diagnosis not present

## 2013-05-16 DIAGNOSIS — N390 Urinary tract infection, site not specified: Secondary | ICD-10-CM | POA: Diagnosis not present

## 2013-05-16 DIAGNOSIS — R350 Frequency of micturition: Secondary | ICD-10-CM | POA: Diagnosis not present

## 2013-05-24 ENCOUNTER — Other Ambulatory Visit: Payer: Self-pay | Admitting: Family Medicine

## 2013-05-24 ENCOUNTER — Ambulatory Visit
Admission: RE | Admit: 2013-05-24 | Discharge: 2013-05-24 | Disposition: A | Discharge status: Home / Self Care | Payer: Medicare Other | Source: Ambulatory Visit | Attending: Family Medicine | Admitting: Family Medicine

## 2013-05-24 DIAGNOSIS — N390 Urinary tract infection, site not specified: Secondary | ICD-10-CM

## 2013-06-05 DIAGNOSIS — Z23 Encounter for immunization: Secondary | ICD-10-CM | POA: Diagnosis not present

## 2013-06-19 DIAGNOSIS — H40009 Preglaucoma, unspecified, unspecified eye: Secondary | ICD-10-CM | POA: Diagnosis not present

## 2013-06-19 DIAGNOSIS — H3320 Serous retinal detachment, unspecified eye: Secondary | ICD-10-CM | POA: Diagnosis not present

## 2013-06-19 DIAGNOSIS — Z961 Presence of intraocular lens: Secondary | ICD-10-CM | POA: Diagnosis not present

## 2013-07-07 DIAGNOSIS — L570 Actinic keratosis: Secondary | ICD-10-CM | POA: Diagnosis not present

## 2013-11-05 DIAGNOSIS — N3000 Acute cystitis without hematuria: Secondary | ICD-10-CM | POA: Diagnosis not present

## 2013-11-05 DIAGNOSIS — R110 Nausea: Secondary | ICD-10-CM | POA: Diagnosis not present

## 2013-11-15 DIAGNOSIS — N390 Urinary tract infection, site not specified: Secondary | ICD-10-CM | POA: Diagnosis not present

## 2013-11-15 DIAGNOSIS — H6990 Unspecified Eustachian tube disorder, unspecified ear: Secondary | ICD-10-CM | POA: Diagnosis not present

## 2013-11-29 DIAGNOSIS — N390 Urinary tract infection, site not specified: Secondary | ICD-10-CM | POA: Diagnosis not present

## 2013-12-29 DIAGNOSIS — N390 Urinary tract infection, site not specified: Secondary | ICD-10-CM | POA: Diagnosis not present

## 2014-02-07 DIAGNOSIS — H4050X0 Glaucoma secondary to other eye disorders, unspecified eye, stage unspecified: Secondary | ICD-10-CM | POA: Diagnosis not present

## 2014-02-07 DIAGNOSIS — Z9889 Other specified postprocedural states: Secondary | ICD-10-CM | POA: Diagnosis not present

## 2014-02-27 DIAGNOSIS — E039 Hypothyroidism, unspecified: Secondary | ICD-10-CM | POA: Diagnosis not present

## 2014-02-27 DIAGNOSIS — K219 Gastro-esophageal reflux disease without esophagitis: Secondary | ICD-10-CM | POA: Diagnosis not present

## 2014-02-27 DIAGNOSIS — M159 Polyosteoarthritis, unspecified: Secondary | ICD-10-CM | POA: Diagnosis not present

## 2014-02-27 DIAGNOSIS — Z23 Encounter for immunization: Secondary | ICD-10-CM | POA: Diagnosis not present

## 2014-02-27 DIAGNOSIS — R42 Dizziness and giddiness: Secondary | ICD-10-CM | POA: Diagnosis not present

## 2014-04-25 DIAGNOSIS — D2360 Other benign neoplasm of skin of unspecified upper limb, including shoulder: Secondary | ICD-10-CM | POA: Diagnosis not present

## 2014-04-25 DIAGNOSIS — L819 Disorder of pigmentation, unspecified: Secondary | ICD-10-CM | POA: Diagnosis not present

## 2014-04-25 DIAGNOSIS — D235 Other benign neoplasm of skin of trunk: Secondary | ICD-10-CM | POA: Diagnosis not present

## 2014-05-24 DIAGNOSIS — Z23 Encounter for immunization: Secondary | ICD-10-CM | POA: Diagnosis not present

## 2014-08-14 DIAGNOSIS — J189 Pneumonia, unspecified organism: Secondary | ICD-10-CM | POA: Diagnosis not present

## 2014-08-28 ENCOUNTER — Ambulatory Visit
Admission: RE | Admit: 2014-08-28 | Discharge: 2014-08-28 | Disposition: A | Discharge status: Home / Self Care | Payer: Medicare Other | Source: Ambulatory Visit | Attending: Family Medicine | Admitting: Family Medicine

## 2014-08-28 ENCOUNTER — Other Ambulatory Visit: Payer: Self-pay | Admitting: Family Medicine

## 2014-08-28 DIAGNOSIS — J189 Pneumonia, unspecified organism: Secondary | ICD-10-CM | POA: Diagnosis not present

## 2014-08-28 DIAGNOSIS — J984 Other disorders of lung: Secondary | ICD-10-CM | POA: Diagnosis not present

## 2014-09-25 ENCOUNTER — Other Ambulatory Visit (HOSPITAL_COMMUNITY): Payer: Self-pay | Admitting: Radiology

## 2014-09-25 DIAGNOSIS — J8410 Pulmonary fibrosis, unspecified: Secondary | ICD-10-CM | POA: Diagnosis not present

## 2014-10-02 ENCOUNTER — Encounter (HOSPITAL_COMMUNITY): Payer: Medicare Other

## 2014-10-03 ENCOUNTER — Ambulatory Visit (HOSPITAL_COMMUNITY)
Admission: RE | Admit: 2014-10-03 | Discharge: 2014-10-03 | Disposition: A | Discharge status: Home / Self Care | Payer: Medicare Other | Source: Ambulatory Visit | Attending: Family Medicine | Admitting: Family Medicine

## 2014-10-03 DIAGNOSIS — J8410 Pulmonary fibrosis, unspecified: Secondary | ICD-10-CM | POA: Insufficient documentation

## 2014-10-03 MED ORDER — ALBUTEROL SULFATE (2.5 MG/3ML) 0.083% IN NEBU
2.5000 mg | INHALATION_SOLUTION | Freq: Once | RESPIRATORY_TRACT | Status: AC
  Administered 2014-10-03: 2.5 mg via RESPIRATORY_TRACT

## 2014-10-04 LAB — PULMONARY FUNCTION TEST
DL/VA % pred: 62 %
DL/VA: 3.07 ml/min/mmHg/L
DLCO UNC % PRED: 45 %
DLCO unc: 11.75 ml/min/mmHg
FEF 25-75 POST: 3.82 L/s
FEF 25-75 Pre: 3.83 L/sec
FEF2575-%Change-Post: 0 %
FEF2575-%PRED-POST: 393 %
FEF2575-%Pred-Pre: 395 %
FEV1-%CHANGE-POST: 0 %
FEV1-%PRED-POST: 126 %
FEV1-%PRED-PRE: 126 %
FEV1-PRE: 2.13 L
FEV1-Post: 2.13 L
FEV1FVC-%CHANGE-POST: 1 %
FEV1FVC-%Pred-Pre: 124 %
FEV6-%CHANGE-POST: -1 %
FEV6-%PRED-POST: 108 %
FEV6-%Pred-Pre: 110 %
FEV6-POST: 2.34 L
FEV6-Pre: 2.38 L
FEV6FVC-%PRED-POST: 107 %
FEV6FVC-%Pred-Pre: 107 %
FVC-%CHANGE-POST: -1 %
FVC-%PRED-PRE: 103 %
FVC-%Pred-Post: 101 %
FVC-POST: 2.34 L
FVC-Pre: 2.38 L
PRE FEV1/FVC RATIO: 90 %
Post FEV1/FVC ratio: 91 %
Post FEV6/FVC ratio: 100 %
Pre FEV6/FVC Ratio: 100 %
RV % PRED: 61 %
RV: 1.62 L
TLC % pred: 79 %
TLC: 4.12 L

## 2014-11-08 DIAGNOSIS — Z961 Presence of intraocular lens: Secondary | ICD-10-CM | POA: Diagnosis not present

## 2014-11-08 DIAGNOSIS — Z9889 Other specified postprocedural states: Secondary | ICD-10-CM | POA: Diagnosis not present

## 2014-11-08 DIAGNOSIS — H4051X4 Glaucoma secondary to other eye disorders, right eye, indeterminate stage: Secondary | ICD-10-CM | POA: Diagnosis not present

## 2014-12-16 DIAGNOSIS — N390 Urinary tract infection, site not specified: Secondary | ICD-10-CM | POA: Diagnosis not present

## 2014-12-26 DIAGNOSIS — N3090 Cystitis, unspecified without hematuria: Secondary | ICD-10-CM | POA: Diagnosis not present

## 2014-12-26 DIAGNOSIS — R42 Dizziness and giddiness: Secondary | ICD-10-CM | POA: Diagnosis not present

## 2014-12-26 DIAGNOSIS — H9190 Unspecified hearing loss, unspecified ear: Secondary | ICD-10-CM | POA: Diagnosis not present

## 2015-02-28 DIAGNOSIS — E039 Hypothyroidism, unspecified: Secondary | ICD-10-CM | POA: Diagnosis not present

## 2015-02-28 DIAGNOSIS — J449 Chronic obstructive pulmonary disease, unspecified: Secondary | ICD-10-CM | POA: Diagnosis not present

## 2015-02-28 DIAGNOSIS — R42 Dizziness and giddiness: Secondary | ICD-10-CM | POA: Diagnosis not present

## 2015-02-28 DIAGNOSIS — M150 Primary generalized (osteo)arthritis: Secondary | ICD-10-CM | POA: Diagnosis not present

## 2015-02-28 DIAGNOSIS — M8589 Other specified disorders of bone density and structure, multiple sites: Secondary | ICD-10-CM | POA: Diagnosis not present

## 2015-02-28 DIAGNOSIS — K219 Gastro-esophageal reflux disease without esophagitis: Secondary | ICD-10-CM | POA: Diagnosis not present

## 2015-03-26 DIAGNOSIS — M810 Age-related osteoporosis without current pathological fracture: Secondary | ICD-10-CM | POA: Diagnosis not present

## 2015-05-27 DIAGNOSIS — Z961 Presence of intraocular lens: Secondary | ICD-10-CM | POA: Diagnosis not present

## 2015-05-27 DIAGNOSIS — H4051X4 Glaucoma secondary to other eye disorders, right eye, indeterminate stage: Secondary | ICD-10-CM | POA: Diagnosis not present

## 2015-06-20 DIAGNOSIS — H4051X4 Glaucoma secondary to other eye disorders, right eye, indeterminate stage: Secondary | ICD-10-CM | POA: Diagnosis not present

## 2015-07-04 DIAGNOSIS — L821 Other seborrheic keratosis: Secondary | ICD-10-CM | POA: Diagnosis not present

## 2015-07-04 DIAGNOSIS — L570 Actinic keratosis: Secondary | ICD-10-CM | POA: Diagnosis not present

## 2015-07-04 DIAGNOSIS — D225 Melanocytic nevi of trunk: Secondary | ICD-10-CM | POA: Diagnosis not present

## 2015-07-04 DIAGNOSIS — L298 Other pruritus: Secondary | ICD-10-CM | POA: Diagnosis not present

## 2015-07-04 DIAGNOSIS — L814 Other melanin hyperpigmentation: Secondary | ICD-10-CM | POA: Diagnosis not present

## 2015-07-08 DIAGNOSIS — Z23 Encounter for immunization: Secondary | ICD-10-CM | POA: Diagnosis not present

## 2015-07-17 DIAGNOSIS — H4051X3 Glaucoma secondary to other eye disorders, right eye, severe stage: Secondary | ICD-10-CM | POA: Diagnosis not present

## 2015-09-02 DIAGNOSIS — E039 Hypothyroidism, unspecified: Secondary | ICD-10-CM | POA: Diagnosis not present

## 2015-09-02 DIAGNOSIS — J449 Chronic obstructive pulmonary disease, unspecified: Secondary | ICD-10-CM | POA: Diagnosis not present

## 2015-09-02 DIAGNOSIS — K219 Gastro-esophageal reflux disease without esophagitis: Secondary | ICD-10-CM | POA: Diagnosis not present

## 2015-09-11 DIAGNOSIS — H4051X3 Glaucoma secondary to other eye disorders, right eye, severe stage: Secondary | ICD-10-CM | POA: Diagnosis not present

## 2015-12-23 DIAGNOSIS — H4051X3 Glaucoma secondary to other eye disorders, right eye, severe stage: Secondary | ICD-10-CM | POA: Diagnosis not present

## 2016-03-02 DIAGNOSIS — H9311 Tinnitus, right ear: Secondary | ICD-10-CM | POA: Diagnosis not present

## 2016-03-02 DIAGNOSIS — H903 Sensorineural hearing loss, bilateral: Secondary | ICD-10-CM | POA: Diagnosis not present

## 2016-03-09 DIAGNOSIS — K219 Gastro-esophageal reflux disease without esophagitis: Secondary | ICD-10-CM | POA: Diagnosis not present

## 2016-03-09 DIAGNOSIS — J449 Chronic obstructive pulmonary disease, unspecified: Secondary | ICD-10-CM | POA: Diagnosis not present

## 2016-03-09 DIAGNOSIS — E039 Hypothyroidism, unspecified: Secondary | ICD-10-CM | POA: Diagnosis not present

## 2016-03-09 DIAGNOSIS — M8589 Other specified disorders of bone density and structure, multiple sites: Secondary | ICD-10-CM | POA: Diagnosis not present

## 2016-05-25 DIAGNOSIS — H4050X0 Glaucoma secondary to other eye disorders, unspecified eye, stage unspecified: Secondary | ICD-10-CM | POA: Diagnosis not present

## 2016-06-23 DIAGNOSIS — Z23 Encounter for immunization: Secondary | ICD-10-CM | POA: Diagnosis not present

## 2016-09-17 DIAGNOSIS — L570 Actinic keratosis: Secondary | ICD-10-CM | POA: Diagnosis not present

## 2016-09-17 DIAGNOSIS — D2271 Melanocytic nevi of right lower limb, including hip: Secondary | ICD-10-CM | POA: Diagnosis not present

## 2016-09-17 DIAGNOSIS — L821 Other seborrheic keratosis: Secondary | ICD-10-CM | POA: Diagnosis not present

## 2016-09-17 DIAGNOSIS — L814 Other melanin hyperpigmentation: Secondary | ICD-10-CM | POA: Diagnosis not present

## 2016-09-17 DIAGNOSIS — L718 Other rosacea: Secondary | ICD-10-CM | POA: Diagnosis not present

## 2016-09-17 DIAGNOSIS — D692 Other nonthrombocytopenic purpura: Secondary | ICD-10-CM | POA: Diagnosis not present

## 2016-11-30 DIAGNOSIS — H4052X0 Glaucoma secondary to other eye disorders, left eye, stage unspecified: Secondary | ICD-10-CM | POA: Diagnosis not present

## 2017-03-09 DIAGNOSIS — J449 Chronic obstructive pulmonary disease, unspecified: Secondary | ICD-10-CM | POA: Diagnosis not present

## 2017-03-09 DIAGNOSIS — K219 Gastro-esophageal reflux disease without esophagitis: Secondary | ICD-10-CM | POA: Diagnosis not present

## 2017-03-09 DIAGNOSIS — E039 Hypothyroidism, unspecified: Secondary | ICD-10-CM | POA: Diagnosis not present

## 2017-03-09 DIAGNOSIS — M8589 Other specified disorders of bone density and structure, multiple sites: Secondary | ICD-10-CM | POA: Diagnosis not present

## 2017-06-07 DIAGNOSIS — Z23 Encounter for immunization: Secondary | ICD-10-CM | POA: Diagnosis not present

## 2017-06-16 DIAGNOSIS — C52 Malignant neoplasm of vagina: Secondary | ICD-10-CM | POA: Diagnosis not present

## 2017-06-16 DIAGNOSIS — N9489 Other specified conditions associated with female genital organs and menstrual cycle: Secondary | ICD-10-CM | POA: Diagnosis not present

## 2017-07-07 ENCOUNTER — Encounter: Payer: Self-pay | Admitting: Radiation Oncology

## 2017-07-07 ENCOUNTER — Encounter: Payer: Self-pay | Admitting: Gynecologic Oncology

## 2017-07-07 ENCOUNTER — Ambulatory Visit (HOSPITAL_BASED_OUTPATIENT_CLINIC_OR_DEPARTMENT_OTHER): Payer: Medicare Other

## 2017-07-07 ENCOUNTER — Ambulatory Visit: Payer: Medicare Other | Attending: Gynecologic Oncology | Admitting: Gynecologic Oncology

## 2017-07-07 VITALS — BP 137/71 | HR 66 | Temp 97.7°F | Resp 18 | Ht 64.0 in | Wt 137.3 lb

## 2017-07-07 DIAGNOSIS — K219 Gastro-esophageal reflux disease without esophagitis: Secondary | ICD-10-CM | POA: Insufficient documentation

## 2017-07-07 DIAGNOSIS — J8410 Pulmonary fibrosis, unspecified: Secondary | ICD-10-CM | POA: Diagnosis not present

## 2017-07-07 DIAGNOSIS — E039 Hypothyroidism, unspecified: Secondary | ICD-10-CM | POA: Diagnosis not present

## 2017-07-07 DIAGNOSIS — C541 Malignant neoplasm of endometrium: Secondary | ICD-10-CM | POA: Diagnosis not present

## 2017-07-07 DIAGNOSIS — Z91013 Allergy to seafood: Secondary | ICD-10-CM | POA: Insufficient documentation

## 2017-07-07 DIAGNOSIS — Z86010 Personal history of colonic polyps: Secondary | ICD-10-CM | POA: Insufficient documentation

## 2017-07-07 DIAGNOSIS — H409 Unspecified glaucoma: Secondary | ICD-10-CM | POA: Diagnosis not present

## 2017-07-07 DIAGNOSIS — Z90710 Acquired absence of both cervix and uterus: Secondary | ICD-10-CM | POA: Diagnosis not present

## 2017-07-07 DIAGNOSIS — C7982 Secondary malignant neoplasm of genital organs: Secondary | ICD-10-CM | POA: Insufficient documentation

## 2017-07-07 DIAGNOSIS — Z79899 Other long term (current) drug therapy: Secondary | ICD-10-CM | POA: Insufficient documentation

## 2017-07-07 LAB — BASIC METABOLIC PANEL
ANION GAP: 8 meq/L (ref 3–11)
BUN: 16.4 mg/dL (ref 7.0–26.0)
CALCIUM: 9.5 mg/dL (ref 8.4–10.4)
CO2: 26 meq/L (ref 22–29)
CREATININE: 0.8 mg/dL (ref 0.6–1.1)
Chloride: 106 mEq/L (ref 98–109)
EGFR: >60 mL/min/{1.73_m2} (ref >60)
GLUCOSE: 94 mg/dL (ref 70–140)
Potassium: 4.3 mEq/L (ref 3.5–5.1)
SODIUM: 141 meq/L (ref 136–145)

## 2017-07-07 NOTE — Progress Notes (Signed)
Consult Note: Gyn-Onc  Consult was requested by Dr. Ulanda Edison for the evaluation of Tanya Martinez 83 y.o. female  CC:  Chief Complaint  Patient presents with  . Endometrial cancer, grade I [facility])    Assessment/Plan:  [name]  is a 83 y.o.  year old with recurrent endometrial carcinoma (grade 1) at the distal vagina.  The lesion was grossly resected with biopsy, but microscopic disease certainly remains. She is advanced in age but has an excellent functional performance status.   I am recommending staging PET scan to evaluate for extent of disease.  If it is localized to the vagina, I would recommend primary radiation therapy. Given the very low volume of disease, this may be amenable to vaginal brachytherapy only in order to spare her the toxicities of external beam. Alternatively, if Dr Sondra Come believes he can focus the therapy without substantial toxicity, a combination of external beam/brachy could be considered, but, provided PET is unremarkable, I do not believe the nodes or pelvic sidewall needs to be included in the fields.  Alternatively, if radiation is not an option or desired by the patient, we could consider hormonal modulation with progestin therapy given the well differentiated low grade nature of her tumor (ER/PR receptors pending).  Consultation has been arranged with Radiation Oncology.   HPI: Tanya Martinez is a 83 year old P1 who is seen in consultation at the request of Dr Daiva Huge for endometrial cancer recurrence.  The patient has a remote history of a TAH BSO in 1989 with Dr Ulanda Edison for grade 1, stage IA (clinical staging) endometrial cancer.  She was managed expectantly and did well.  In mid October, 2018 she noticed vaginal bleeding and saw Dr Ulanda Edison. On his exam a polypoid outgrowth was noted in the distal third of the left vaginal sidewall. Her vagina is substantially narrowed from atrophy and exam was difficult. The lesion broke off spontaneously with no gross  residual remaining. It was sent for pathology which revealed a grade 1 endometrioid adenocarcinoma.   The patient is fairly healthy despite her advanced age. She has pulmonary fibrosis. She has had 1 cesarean section.  Current Meds:  Outpatient Encounter Medications as of 07/07/2017  Medication Sig  . albuterol (PROVENTIL HFA;VENTOLIN HFA) 108 (90 Base) MCG/ACT inhaler Inhale 1-2 puffs into the lungs every 4 (four) hours as needed for wheezing or shortness of breath.  . Calcium Carbonate-Vit D-Min (CALTRATE 600+D PLUS MINERALS) 600-800 MG-UNIT TABS Take 1 tablet by mouth daily at 12 noon.  Marland Kitchen levothyroxine (SYNTHROID, LEVOTHROID) 50 MCG tablet Take 50 mcg by mouth daily.    Marland Kitchen omeprazole (PRILOSEC OTC) 20 MG tablet Take 20 mg by mouth daily.  . travoprost, benzalkonium, (TRAVATAN) 0.004 % ophthalmic solution Apply 1 drop to eye at bedtime.   . [DISCONTINUED] cefpodoxime (VANTIN) 100 MG tablet Take 2 tablets (200 mg total) by mouth 2 (two) times daily. For 4 days   No facility-administered encounter medications on file as of 07/07/2017.     Allergy:  Allergies  Allergen Reactions  . Azithromycin     Other reaction(s): GI Upset (intolerance)  . Shellfish Allergy Nausea Only  . Sulfa Antibiotics Other (See Comments)    "Lips felt numb"    Social Hx:   Social History   Socioeconomic History  . Marital status: Widowed    Spouse name: Not on file  . Number of children: 1  . Years of education: Not on file  . Highest education level: Not on file  Social Needs  . Financial resource strain: Not on file  . Food insecurity - worry: Not on file  . Food insecurity - inability: Not on file  . Transportation needs - medical: Not on file  . Transportation needs - non-medical: Not on file  Occupational History  . Occupation: retired  Tobacco Use  . Smoking status: Never Smoker  . Smokeless tobacco: Never Used  Substance and Sexual Activity  . Alcohol use: Yes    Comment: occasionally   . Drug use: No  . Sexual activity: Not on file  Other Topics Concern  . Not on file  Social History Narrative  . Not on file    Past Surgical Hx:  Past Surgical History:  Procedure Laterality Date  . BREAST LUMPECTOMY     2 lumps removed from rt breast  . CESAREAN SECTION    . COLONOSCOPY    . EYE SURGERY     detached retina  . HEMORRHOID SURGERY    . THYROID SURGERY     growth removed    Past Medical Hx:  Past Medical History:  Diagnosis Date  . Acid reflux   . Colon polyps   . Glaucoma   . Hypothyroidism   . Pneumonia   . Uterine cancer [facility])     Past Gynecological History:  C/s x 1, history of stage I endometrial cancer in 1989. No LMP recorded. Patient is postmenopausal.  Family Hx:  Family History  Problem Relation Age of Onset  . Heart attack Mother   . Heart disease Mother   . Heart attack Brother   . Heart disease Brother   . Heart attack Sister   . Heart disease Sister   . Stroke Father     Review of Systems:  Constitutional  Feels well,    ENT Normal appearing ears and nares bilaterally Skin/Breast  No rash, sores, jaundice, itching, dryness Cardiovascular  No chest pain, shortness of breath, or edema  Pulmonary  No cough or wheeze.  Gastro Intestinal  No nausea, vomitting, or diarrhoea. No bright red blood per rectum, no abdominal pain, change in bowel movement, or constipation.  Genito Urinary  No frequency, urgency, dysuria, + vaginal spotting. Musculo Skeletal  No myalgia, arthralgia, joint swelling or pain  Neurologic  No weakness, numbness, change in gait,  Psychology  No depression, anxiety, insomnia.   Vitals:  Blood pressure 137/71, pulse 66, temperature 97.7 F (36.5 C), temperature source Oral, resp. rate 18, height 5\' 4"  (1.626 m), weight 137 lb 4.8 oz (62.3 kg), SpO2 99 %.  Physical Exam: WD in NAD Neck  Supple NROM, without any enlargements.  Lymph Node Survey No cervical supraclavicular or inguinal adenopathy  though there is a prominent lymph node in the right groin. Cardiovascular  Pulse normal rate, regularity and rhythm. S1 and S2 normal.  Lungs  Clear to auscultation bilateraly, without wheezes/crackles/rhonchi. Good air movement.  Skin  No rash/lesions/breakdown  Psychiatry  Alert and oriented to person, place, and time  Abdomen  Normoactive bowel sounds, abdomen soft, non-tender and thin without evidence of hernia.  Back No CVA tenderness Genito Urinary  Vulva/vagina: Normal external female genitalia.  No lesions. No discharge or bleeding.  Bladder/urethra:  No lesions or masses, well supported bladder  Vagina: very narrow introitus, accomodates narrow speculum and single digit on exam. No visible residual disease on left sidewall. There is a palpable ridge like area on the left that may represent the residual tumor. It is very superficial and  approximately 1cm. It is approximately 5cm from the introitus. No blood. The upper vagina is agglutinated.  Cervix and uterus surgically absent  Adnexa: no palpable masses. Rectal  Good tone, no masses no cul de sac nodularity. Not able to appreciate vaginal mass via rectal exam. Extremities  No bilateral cyanosis, clubbing or edema.   Donaciano Eva, MD  07/07/2017, 3:36 PM

## 2017-07-07 NOTE — Progress Notes (Signed)
ER PR ordered through [facility] in [place] on path [phone]

## 2017-07-07 NOTE — Patient Instructions (Signed)
Plan on having a PET scan to evaluate for metastatic disease.  We will call you with the results.  We will also arrange for you to meet with Dr. Gery Pray in Radiation Oncology on the ground floor in the [facility].

## 2017-07-12 ENCOUNTER — Ambulatory Visit: Payer: Medicare Other | Admitting: Gynecologic Oncology

## 2017-07-16 NOTE — Progress Notes (Signed)
GYN Location of Tumor / Histology: recurrent endometrial carcinoma (grade 1) at the distal vagina.  Tanya Martinez presented with symptoms of: In mid October, 2018 she noticed vaginal bleeding.   Biopsies revealed:   06/16/17   Past/Anticipated interventions by Gyn/Onc surgery, if any: hysterectomy in 1989.  Past/Anticipated interventions by medical oncology, if any: no  Weight changes, if any: has lost about 15 lbs in the last year.  Reports having a poor appetite.  Bowel/Bladder complaints, if any: denies having any bladder issues.  She said she always has had constipation.  Nausea/Vomiting, if any: no  Pain issues, if any:  no  SAFETY ISSUES:  Prior radiation? no  Pacemaker/ICD? no  Possible current pregnancy? no  Is the patient on methotrexate? no  Current Complaints / other details:  PET scan scheduled for 07/22/17.  She reports having red vaginal bleeding before she goes to bed at night.  She does not have any spotting in the day time.  She is here with her daughter.  BP (!) 149/70 (BP Location: Right Arm, Patient Position: Sitting)   Pulse 64   Temp 98 F (36.7 C) (Oral)   Ht 5\' 4"  (1.626 m)   Wt 134 lb 12.8 oz (61.1 kg)   SpO2 96%   BMI 23.14 kg/m    Wt Readings from Last 3 Encounters:  07/22/17 134 lb 12.8 oz (61.1 kg)  07/07/17 137 lb 4.8 oz (62.3 kg)  03/05/13 154 lb 12.8 oz (70.2 kg)

## 2017-07-22 ENCOUNTER — Ambulatory Visit
Admission: RE | Admit: 2017-07-22 | Discharge: 2017-07-22 | Disposition: A | Discharge status: Home / Self Care | Payer: Medicare Other | Source: Ambulatory Visit | Attending: Radiation Oncology | Admitting: Radiation Oncology

## 2017-07-22 ENCOUNTER — Ambulatory Visit (HOSPITAL_COMMUNITY)
Admission: RE | Admit: 2017-07-22 | Discharge: 2017-07-22 | Disposition: A | Discharge status: Home / Self Care | Payer: Medicare Other | Source: Ambulatory Visit | Attending: Gynecologic Oncology | Admitting: Gynecologic Oncology

## 2017-07-22 ENCOUNTER — Encounter: Payer: Self-pay | Admitting: Radiation Oncology

## 2017-07-22 ENCOUNTER — Other Ambulatory Visit: Payer: Self-pay

## 2017-07-22 VITALS — BP 149/70 | HR 64 | Temp 98.0°F | Ht 64.0 in | Wt 134.8 lb

## 2017-07-22 DIAGNOSIS — Z86010 Personal history of colonic polyps: Secondary | ICD-10-CM | POA: Insufficient documentation

## 2017-07-22 DIAGNOSIS — K449 Diaphragmatic hernia without obstruction or gangrene: Secondary | ICD-10-CM | POA: Insufficient documentation

## 2017-07-22 DIAGNOSIS — Z79890 Hormone replacement therapy (postmenopausal): Secondary | ICD-10-CM | POA: Insufficient documentation

## 2017-07-22 DIAGNOSIS — Z8542 Personal history of malignant neoplasm of other parts of uterus: Secondary | ICD-10-CM | POA: Diagnosis not present

## 2017-07-22 DIAGNOSIS — K5909 Other constipation: Secondary | ICD-10-CM | POA: Insufficient documentation

## 2017-07-22 DIAGNOSIS — Z881 Allergy status to other antibiotic agents status: Secondary | ICD-10-CM | POA: Insufficient documentation

## 2017-07-22 DIAGNOSIS — Z8249 Family history of ischemic heart disease and other diseases of the circulatory system: Secondary | ICD-10-CM | POA: Insufficient documentation

## 2017-07-22 DIAGNOSIS — R9389 Abnormal findings on diagnostic imaging of other specified body structures: Secondary | ICD-10-CM | POA: Insufficient documentation

## 2017-07-22 DIAGNOSIS — Z90710 Acquired absence of both cervix and uterus: Secondary | ICD-10-CM | POA: Insufficient documentation

## 2017-07-22 DIAGNOSIS — C7982 Secondary malignant neoplasm of genital organs: Secondary | ICD-10-CM | POA: Diagnosis present

## 2017-07-22 DIAGNOSIS — K219 Gastro-esophageal reflux disease without esophagitis: Secondary | ICD-10-CM | POA: Insufficient documentation

## 2017-07-22 DIAGNOSIS — Z91013 Allergy to seafood: Secondary | ICD-10-CM | POA: Insufficient documentation

## 2017-07-22 DIAGNOSIS — Z90722 Acquired absence of ovaries, bilateral: Secondary | ICD-10-CM | POA: Insufficient documentation

## 2017-07-22 DIAGNOSIS — C541 Malignant neoplasm of endometrium: Secondary | ICD-10-CM | POA: Insufficient documentation

## 2017-07-22 DIAGNOSIS — J849 Interstitial pulmonary disease, unspecified: Secondary | ICD-10-CM | POA: Insufficient documentation

## 2017-07-22 DIAGNOSIS — R1900 Intra-abdominal and pelvic swelling, mass and lump, unspecified site: Secondary | ICD-10-CM | POA: Diagnosis not present

## 2017-07-22 DIAGNOSIS — Z170 Estrogen receptor positive status [ER+]: Secondary | ICD-10-CM | POA: Diagnosis not present

## 2017-07-22 DIAGNOSIS — Z823 Family history of stroke: Secondary | ICD-10-CM | POA: Insufficient documentation

## 2017-07-22 DIAGNOSIS — H409 Unspecified glaucoma: Secondary | ICD-10-CM | POA: Insufficient documentation

## 2017-07-22 DIAGNOSIS — Z882 Allergy status to sulfonamides status: Secondary | ICD-10-CM | POA: Insufficient documentation

## 2017-07-22 DIAGNOSIS — Z6823 Body mass index (BMI) 23.0-23.9, adult: Secondary | ICD-10-CM | POA: Insufficient documentation

## 2017-07-22 DIAGNOSIS — E039 Hypothyroidism, unspecified: Secondary | ICD-10-CM | POA: Insufficient documentation

## 2017-07-22 DIAGNOSIS — N939 Abnormal uterine and vaginal bleeding, unspecified: Secondary | ICD-10-CM | POA: Insufficient documentation

## 2017-07-22 DIAGNOSIS — R634 Abnormal weight loss: Secondary | ICD-10-CM | POA: Insufficient documentation

## 2017-07-22 DIAGNOSIS — Z79899 Other long term (current) drug therapy: Secondary | ICD-10-CM | POA: Insufficient documentation

## 2017-07-22 LAB — GLUCOSE, CAPILLARY: Glucose-Capillary: 96 mg/dL (ref 65–99)

## 2017-07-22 MED ORDER — FLUDEOXYGLUCOSE F - 18 (FDG) INJECTION
6.6700 | Freq: Once | INTRAVENOUS | Status: AC | PRN
  Administered 2017-07-22: 6.67 via INTRAVENOUS

## 2017-07-22 NOTE — Progress Notes (Signed)
Radiation Oncology         [phone] ________________________________  Initial Outpatient Consultation  Name: Tanya Martinez MRN: [id number]  Date: 07/22/2017  DOB: [birth year]  DX:[id number], L.Marlou Sa, MD  Everitt Amber, MD   REFERRING PHYSICIAN: Everitt Amber, MD  DIAGNOSIS: 83 y.o.  year old with recurrent endometrial carcinoma (grade 1) at the mid /distal vagina.   HISTORY OF PRESENT ILLNESS::Tanya Martinez is a 83 y.o. female who is seen in consultation at the request of Dr. Denman George for endometrial cancer recurrence at the distal vagina. She has a remote history of a TAH BSO in 1989 with Dr. Ulanda Edison for grade 1, stage IA (clinical staging) endometrial cancer. She was managed expectantly and did well. More recently in mid-October 2018, she noticed vaginal bleeding and returned to Dr. Ulanda Edison. On his exam, a polypoid outgrowth was noted in the distal third of the left vaginal sidewall. Her vagina is substantially narrowed from atrophy, and exam was difficult. The lesion broke off spontaneously with no gross residual remaining. Biopsy of the lesion on 06/16/2017 revealed grade 1 endometrioid adenocarcinoma with squamous differentiation. Estrogen and progesterone receptors positive at 100%  The patient was referred to Dr. Denman George in [place] for evaluation of her disease and discussion of treatment options. She was scheduled for staging PET scan which will take place today. Given that microscopic disease remains, she has kindly been referred today for discussion of potential radiation treatment options. She is accompanied by her daughter. She denies having had any prior chemotherapy or radiotherapy.  On review of systems, she reports she has lost about 15 pounds in the past year and has a poor appetite. She reports chronic constipation. She reports having red vaginal bleeding before she goes to bed at night. She does not have any spotting during the day. She has pulmonary fibrosis and follows up with her  PCP for this.  PREVIOUS RADIATION THERAPY: No  PAST MEDICAL HISTORY:  has a past medical history of Acid reflux, Colon polyps, Glaucoma, Hypothyroidism, Pneumonia, and Uterine cancer ([facility]).    PAST SURGICAL HISTORY: Past Surgical History:  Procedure Laterality Date  . BREAST LUMPECTOMY     2 lumps removed from rt breast  . CESAREAN SECTION    . COLONOSCOPY    . EYE SURGERY     detached retina  . HEMORRHOID SURGERY    . HYSTERECTOMY ABDOMINAL WITH SALPINGECTOMY  1989  . THYROID SURGERY     growth removed    FAMILY HISTORY: family history includes Heart attack in her brother, mother, and sister; Heart disease in her brother, mother, and sister; Stroke in her father.  SOCIAL HISTORY:  reports that  has never smoked. she has never used smokeless tobacco. She reports that she drinks alcohol. She reports that she does not use drugs.  ALLERGIES: Azithromycin; Shellfish allergy; and Sulfa antibiotics  MEDICATIONS:  Current Outpatient Medications  Medication Sig Dispense Refill  . albuterol (PROVENTIL HFA;VENTOLIN HFA) 108 (90 Base) MCG/ACT inhaler Inhale 1-2 puffs into the lungs every 4 (four) hours as needed for wheezing or shortness of breath.    . Calcium Carbonate-Vit D-Min (CALTRATE 600+D PLUS MINERALS) 600-800 MG-UNIT TABS Take 1 tablet by mouth daily at 12 noon.    Marland Kitchen levothyroxine (SYNTHROID, LEVOTHROID) 50 MCG tablet Take 50 mcg by mouth daily.      Marland Kitchen omeprazole (PRILOSEC OTC) 20 MG tablet Take 20 mg by mouth daily.    . travoprost, benzalkonium, (TRAVATAN) 0.004 % ophthalmic solution Apply 1 drop to  eye at bedtime.      No current facility-administered medications for this encounter.     REVIEW OF SYSTEMS:  REVIEW OF SYSTEMS: A 10+ POINT REVIEW OF SYSTEMS WAS OBTAINED including neurology, dermatology, psychiatry, cardiac, respiratory, lymph, extremities, GI, GU, musculoskeletal, constitutional, reproductive, HEENT. All pertinent positives are noted in the HPI. All others are  negative.   PHYSICAL EXAM:  height is 5\' 4"  (1.626 m) and weight is 134 lb 12.8 oz (61.1 kg). Her oral temperature is 98 F (36.7 C). Her blood pressure is 149/70 (abnormal) and her pulse is 64. Her oxygen saturation is 96%.   General: Alert and oriented, in no acute distress. HEENT: Head is normocephalic. Extraocular movements are intact. PERRLA. Oropharynx is clear. Neck: Neck is supple, no palpable cervical or supraclavicular lymphadenopathy. Heart: Regular in rate and rhythm with no murmurs, rubs, or gallops. Chest: Clear to auscultation bilaterally, with no rhonchi, wheezes, or rales. Abdomen: Soft, nontender, nondistended, with no rigidity or guarding. Normal bowel sounds. Extremities: No cyanosis or edema. Distal pulses intact. Lymphatics: see Neck Exam Skin: No concerning lesions. Musculoskeletal: Symmetric strength and muscle tone throughout. Neurologic: Speech is fluent. Coordination is intact. Psychiatric: Judgment and insight are intact. Affect is appropriate.  On pelvic examination the external genitalia were unremarkable. A speculum exam was performed. The vaginal vault would permit a small speculum There are no visible mucosal lesions noted in the vaginal vault. On digital exam there is a palpable ridge/regularity along the left lateral vaginal vault. This seems to be very superficial, approximately 1 cm in size. This is approximately 4-5 cm from the introitus. No pelvic masses appreciated but some thickening noted along the anterior vaginal wall. Cervix and uterus are surgically absent. Rectal exam not performed today but Dr. Serita Grit rectal exam reviewed.   ECOG = 1  0 - Asymptomatic (Fully active, able to carry on all predisease activities without restriction)  1 - Symptomatic but completely ambulatory (Restricted in physically strenuous activity but ambulatory and able to carry out work of a light or sedentary nature. For example, light housework, office work)  2 -  Symptomatic, <50% in bed during the day (Ambulatory and capable of all self care but unable to carry out any work activities. Up and about more than 50% of waking hours)  3 - Symptomatic, >50% in bed, but not bedbound (Capable of only limited self-care, confined to bed or chair 50% or more of waking hours)  4 - Bedbound (Completely disabled. Cannot carry on any self-care. Totally confined to bed or chair)  5 - Death   Eustace Pen MM, Creech RH, Tormey DC, et al. [phone]). "Toxicity and response criteria of the [facility]". [place] Oncol. 5 (6): 649-55  LABORATORY DATA:  Lab Results  Component Value Date   WBC 4.8 03/06/2013   HGB 12.7 03/06/2013   HCT 37.7 03/06/2013   MCV 96.4 03/06/2013   PLT 158 03/06/2013   NEUTROABS 3.4 03/05/2013   Lab Results  Component Value Date   NA 141 07/07/2017   K 4.3 07/07/2017   CL 108 03/06/2013   CO2 26 07/07/2017   GLUCOSE 94 07/07/2017   CREATININE 0.8 07/07/2017   CALCIUM 9.5 07/07/2017      RADIOGRAPHY: No results found.    IMPRESSION: 83 y.o. woman with recurrent endometrial carcinoma (grade 1) at the distal vagina. Her ER/PR receptors have come back 100% positive. She may benefit from  hormonal modulation with progestin therapy. It would be reasonable  for the patient to proceed with progestin therapy and then revisit radiotherapy if her disease remains or returns. I discussed with the patient and her daughter that the standard of care in this situation would be whole pelvic radiation therapy followed by intracavitary brachytherapy treatments assuming PET scan shows no other disease. However with her advanced age I think this would be quite difficult for her.  If she were to decide to proceed with radiotherapy, it would be reasonable to consider with vaginal brachytherapy alone since this lesion appears to be quite superficial and brachytherapy with cover the extent of disease. Based on exam today I do feel we could treat  her with 2.0 cm  diameter vaginal cylinder. We discussed the details of logistics and delivery of vaginal brachtherapy.  We reviewed the anticipated acute and late side effects associated with radiation in this setting.  The patient was encouraged to ask questions that I answered to the best of my ability.  PLAN:  We will call the patient's daughter later today to discuss the patient's PET results. The patient was advised to return to Dr. Ulanda Edison or Dr. Denman George if her bleeding persists or worsens. She knows to call or return if she has any further questions or concerns. She will call us with her decision regarding treatment of her disease.     ------------------------------------------------  Blair Promise, PhD, MD  This document serves as a record of services personally performed by Gery Pray, MD. It was created on his behalf by Rae Lips, a trained medical scribe. The creation of this record is based on the scribe's personal observations and the provider's statements to them. This document has been checked and approved by the attending provider.

## 2017-07-23 ENCOUNTER — Telehealth: Payer: Self-pay | Admitting: Gynecologic Oncology

## 2017-07-23 NOTE — Telephone Encounter (Signed)
Left message for patient's daughter to call the office.

## 2017-07-23 NOTE — Telephone Encounter (Signed)
Spoke with the patient's daughter about PET scan and Dr. Serita Grit recommendations that it looks like she probably needs whole pelvic RT rather than just vaginal radiation.  Advised her that I would touch base with Dr. Sondra Come the beginning of next week to see if he felt a biopsy of the mass was warranted.  Advised daughter I would give her a call next week after our discussion.

## 2017-07-28 ENCOUNTER — Telehealth: Payer: Self-pay | Admitting: Gynecologic Oncology

## 2017-07-28 NOTE — Telephone Encounter (Signed)
Returned call to daughter.  Advised her that Dr. Sondra Come is aware of Dr. Serita Grit recommendations for whole pelvic RT with vaginal RT.  Also advised her mother's case would be discussed at tumor conference.  All questions answered.  Advised she would be receiving a phone call from Dr. Clabe Seal office to arrange an appointment.

## 2017-08-02 ENCOUNTER — Encounter: Payer: Self-pay | Admitting: Gynecologic Oncology

## 2017-08-02 NOTE — Progress Notes (Signed)
Gynecologic Oncology Multi-Disciplinary Disposition Conference Note  Date of the Conference: August 02, 2017  Patient Name: Tanya Martinez  Referring Provider: Dr. Ulanda Edison Primary GYN Oncologist: Dr. Everitt Amber   Stage/Disposition:  Recurrent endometrial carcinoma.  Disposition is to biopsy of the bladder mass prior to initiation of treatment.   This Multidisciplinary conference took place involving physicians from [facility], [facility], Radiation Oncology, Pathology, Radiology along with the Gynecologic Oncology Nurse Practitioner and RN.  Comprehensive assessment of the patient's malignancy, staging, need for surgery, chemotherapy, radiation therapy, and need for further testing were reviewed. Supportive measures, both inpatient and following discharge were also discussed. The recommended plan of care is documented. Greater than 35 minutes were spent correlating and coordinating this patient's care.

## 2017-08-03 ENCOUNTER — Telehealth: Payer: Self-pay | Admitting: Gynecologic Oncology

## 2017-08-03 NOTE — Telephone Encounter (Signed)
Spoke with the daughter about the recommendations to proceed with a biopsy of the mass near the bladder prior to beginning treatment.  All questions answered.  Records faxed to [facility] and confirmation of receipt received.  Per IR, Uro evaluation as first step to see if biopsy can be obtained with cystoscopy.  Advised daughter she would be receiving a phone call with an appt.  Advised to call for any needs

## 2017-08-12 DIAGNOSIS — R350 Frequency of micturition: Secondary | ICD-10-CM | POA: Diagnosis not present

## 2017-08-12 DIAGNOSIS — C674 Malignant neoplasm of posterior wall of bladder: Secondary | ICD-10-CM | POA: Diagnosis not present

## 2017-08-12 DIAGNOSIS — C541 Malignant neoplasm of endometrium: Secondary | ICD-10-CM | POA: Diagnosis not present

## 2017-08-19 ENCOUNTER — Other Ambulatory Visit: Payer: Self-pay | Admitting: Urology

## 2017-08-20 ENCOUNTER — Other Ambulatory Visit: Payer: Self-pay

## 2017-08-20 ENCOUNTER — Encounter (HOSPITAL_COMMUNITY): Payer: Self-pay | Admitting: *Deleted

## 2017-08-25 ENCOUNTER — Other Ambulatory Visit: Payer: Self-pay

## 2017-08-25 ENCOUNTER — Ambulatory Visit (HOSPITAL_COMMUNITY): Payer: Medicare Other | Admitting: Anesthesiology

## 2017-08-25 ENCOUNTER — Observation Stay (HOSPITAL_COMMUNITY): Payer: Medicare Other

## 2017-08-25 ENCOUNTER — Encounter (HOSPITAL_COMMUNITY)
Admission: RE | Disposition: A | Discharge status: Home / Self Care | Payer: Self-pay | Source: Ambulatory Visit | Attending: Urology

## 2017-08-25 ENCOUNTER — Encounter (HOSPITAL_COMMUNITY): Payer: Self-pay | Admitting: *Deleted

## 2017-08-25 ENCOUNTER — Observation Stay (HOSPITAL_COMMUNITY)
Admission: RE | Admit: 2017-08-25 | Discharge: 2017-08-26 | Disposition: A | Discharge status: Home / Self Care | Payer: Medicare Other | Source: Ambulatory Visit | Attending: Urology | Admitting: Urology

## 2017-08-25 DIAGNOSIS — N3289 Other specified disorders of bladder: Secondary | ICD-10-CM | POA: Diagnosis present

## 2017-08-25 DIAGNOSIS — J8410 Pulmonary fibrosis, unspecified: Secondary | ICD-10-CM | POA: Insufficient documentation

## 2017-08-25 DIAGNOSIS — D494 Neoplasm of unspecified behavior of bladder: Secondary | ICD-10-CM | POA: Diagnosis not present

## 2017-08-25 DIAGNOSIS — Z8542 Personal history of malignant neoplasm of other parts of uterus: Secondary | ICD-10-CM | POA: Insufficient documentation

## 2017-08-25 DIAGNOSIS — Z90710 Acquired absence of both cervix and uterus: Secondary | ICD-10-CM | POA: Insufficient documentation

## 2017-08-25 DIAGNOSIS — N390 Urinary tract infection, site not specified: Secondary | ICD-10-CM | POA: Diagnosis not present

## 2017-08-25 DIAGNOSIS — C679 Malignant neoplasm of bladder, unspecified: Principal | ICD-10-CM | POA: Insufficient documentation

## 2017-08-25 DIAGNOSIS — C541 Malignant neoplasm of endometrium: Secondary | ICD-10-CM | POA: Diagnosis not present

## 2017-08-25 DIAGNOSIS — K219 Gastro-esophageal reflux disease without esophagitis: Secondary | ICD-10-CM | POA: Diagnosis not present

## 2017-08-25 HISTORY — PX: CYSTOSCOPY W/ RETROGRADES: SHX1426

## 2017-08-25 HISTORY — PX: TRANSURETHRAL RESECTION OF BLADDER TUMOR: SHX2575

## 2017-08-25 LAB — CBC
HEMATOCRIT: 42 % (ref 36.0–46.0)
Hemoglobin: 14.4 g/dL (ref 12.0–15.0)
MCH: 33.4 pg (ref 26.0–34.0)
MCHC: 34.3 g/dL (ref 30.0–36.0)
MCV: 97.4 fL (ref 78.0–100.0)
PLATELETS: 170 10*3/uL (ref 150–400)
RBC: 4.31 MIL/uL (ref 3.87–5.11)
RDW: 12.8 % (ref 11.5–15.5)
WBC: 5.4 10*3/uL (ref 4.0–10.5)

## 2017-08-25 SURGERY — TURBT (TRANSURETHRAL RESECTION OF BLADDER TUMOR)
Anesthesia: General | Site: Urethra

## 2017-08-25 MED ORDER — LEVOTHYROXINE SODIUM 50 MCG PO TABS
50.0000 ug | ORAL_TABLET | Freq: Every day | ORAL | Status: DC
  Administered 2017-08-26: 50 ug via ORAL
  Filled 2017-08-25: qty 1

## 2017-08-25 MED ORDER — KCL IN DEXTROSE-NACL 20-5-0.45 MEQ/L-%-% IV SOLN
INTRAVENOUS | Status: DC
  Administered 2017-08-25: 19:00:00 via INTRAVENOUS
  Filled 2017-08-25 (×2): qty 1000

## 2017-08-25 MED ORDER — DEXTROSE 5 % IV SOLN
5.0000 mg/kg | INTRAVENOUS | Status: AC
  Administered 2017-08-25: 310 mg via INTRAVENOUS
  Filled 2017-08-25: qty 7.75

## 2017-08-25 MED ORDER — ACETAMINOPHEN 10 MG/ML IV SOLN
INTRAVENOUS | Status: DC | PRN
  Administered 2017-08-25: 1000 mg via INTRAVENOUS

## 2017-08-25 MED ORDER — PROPOFOL 10 MG/ML IV BOLUS
INTRAVENOUS | Status: AC
  Filled 2017-08-25: qty 20

## 2017-08-25 MED ORDER — PANTOPRAZOLE SODIUM 20 MG PO TBEC
20.0000 mg | DELAYED_RELEASE_TABLET | Freq: Every day | ORAL | Status: DC
  Administered 2017-08-25 – 2017-08-26 (×2): 20 mg via ORAL
  Filled 2017-08-25 (×2): qty 1

## 2017-08-25 MED ORDER — DEXAMETHASONE SODIUM PHOSPHATE 10 MG/ML IJ SOLN
INTRAMUSCULAR | Status: DC | PRN
  Administered 2017-08-25: 5 mg via INTRAVENOUS

## 2017-08-25 MED ORDER — TRAMADOL HCL 50 MG PO TABS: 50.0000 mg | ORAL_TABLET | Freq: Four times a day (QID) | ORAL | Status: DC | PRN

## 2017-08-25 MED ORDER — LACTATED RINGERS IV SOLN
INTRAVENOUS | Status: DC
  Administered 2017-08-25: 15:00:00 via INTRAVENOUS

## 2017-08-25 MED ORDER — FENTANYL CITRATE (PF) 100 MCG/2ML IJ SOLN
INTRAMUSCULAR | Status: AC
  Filled 2017-08-25: qty 2

## 2017-08-25 MED ORDER — ONDANSETRON HCL 4 MG/2ML IJ SOLN
INTRAMUSCULAR | Status: AC
  Filled 2017-08-25: qty 2

## 2017-08-25 MED ORDER — FENTANYL CITRATE (PF) 100 MCG/2ML IJ SOLN
INTRAMUSCULAR | Status: DC | PRN
  Administered 2017-08-25 (×2): 25 ug via INTRAVENOUS

## 2017-08-25 MED ORDER — HYDROMORPHONE HCL 1 MG/ML IJ SOLN
0.5000 mg | INTRAMUSCULAR | Status: DC | PRN
  Administered 2017-08-25: 0.5 mg via INTRAVENOUS
  Filled 2017-08-25: qty 1

## 2017-08-25 MED ORDER — ACETAMINOPHEN 10 MG/ML IV SOLN
1000.0000 mg | Freq: Once | INTRAVENOUS | Status: DC | PRN
Start: 2017-08-25 — End: 2017-08-25

## 2017-08-25 MED ORDER — EPHEDRINE SULFATE-NACL 50-0.9 MG/10ML-% IV SOSY
PREFILLED_SYRINGE | INTRAVENOUS | Status: DC | PRN
  Administered 2017-08-25 (×2): 5 mg via INTRAVENOUS

## 2017-08-25 MED ORDER — EPHEDRINE 5 MG/ML INJ
INTRAVENOUS | Status: AC
  Filled 2017-08-25: qty 10

## 2017-08-25 MED ORDER — ONDANSETRON HCL 4 MG/2ML IJ SOLN
INTRAMUSCULAR | Status: DC | PRN
  Administered 2017-08-25: 4 mg via INTRAVENOUS

## 2017-08-25 MED ORDER — IOHEXOL 300 MG/ML  SOLN
INTRAMUSCULAR | Status: DC | PRN
  Administered 2017-08-25: 20 mL

## 2017-08-25 MED ORDER — MEPERIDINE HCL 50 MG/ML IJ SOLN: 6.2500 mg | INTRAMUSCULAR | Status: DC | PRN

## 2017-08-25 MED ORDER — DEXAMETHASONE SODIUM PHOSPHATE 10 MG/ML IJ SOLN
INTRAMUSCULAR | Status: AC
  Filled 2017-08-25: qty 1

## 2017-08-25 MED ORDER — HYDROCODONE-ACETAMINOPHEN 7.5-325 MG PO TABS: 1.0000 | ORAL_TABLET | Freq: Once | ORAL | Status: DC | PRN

## 2017-08-25 MED ORDER — PROPOFOL 10 MG/ML IV BOLUS
INTRAVENOUS | Status: DC | PRN
  Administered 2017-08-25: 90 mg via INTRAVENOUS
  Administered 2017-08-25: 40 mg via INTRAVENOUS

## 2017-08-25 MED ORDER — SODIUM CHLORIDE 0.9 % IR SOLN
Status: DC | PRN
  Administered 2017-08-25: 12000 mL

## 2017-08-25 MED ORDER — HYDROMORPHONE HCL 1 MG/ML IJ SOLN: 0.2500 mg | INTRAMUSCULAR | Status: DC | PRN

## 2017-08-25 MED ORDER — ACETAMINOPHEN 10 MG/ML IV SOLN
INTRAVENOUS | Status: AC
  Filled 2017-08-25: qty 100

## 2017-08-25 MED ORDER — ALBUTEROL SULFATE (2.5 MG/3ML) 0.083% IN NEBU: 3.0000 mL | INHALATION_SOLUTION | RESPIRATORY_TRACT | Status: DC | PRN

## 2017-08-25 MED ORDER — ACETAMINOPHEN 500 MG PO TABS
1000.0000 mg | ORAL_TABLET | Freq: Three times a day (TID) | ORAL | Status: DC
  Administered 2017-08-25 – 2017-08-26 (×2): 1000 mg via ORAL
  Filled 2017-08-25 (×2): qty 2

## 2017-08-25 MED ORDER — LATANOPROST 0.005 % OP SOLN
1.0000 [drp] | Freq: Every day | OPHTHALMIC | Status: DC
  Administered 2017-08-25: 1 [drp] via OPHTHALMIC
  Filled 2017-08-25: qty 2.5

## 2017-08-25 MED ORDER — LIDOCAINE 2% (20 MG/ML) 5 ML SYRINGE
INTRAMUSCULAR | Status: AC
  Filled 2017-08-25: qty 5

## 2017-08-25 MED ORDER — PROMETHAZINE HCL 25 MG/ML IJ SOLN: 6.2500 mg | INTRAMUSCULAR | Status: DC | PRN

## 2017-08-25 MED ORDER — LIDOCAINE 2% (20 MG/ML) 5 ML SYRINGE
INTRAMUSCULAR | Status: DC | PRN
  Administered 2017-08-25: 100 mg via INTRAVENOUS

## 2017-08-25 SURGICAL SUPPLY — 32 items
BAG URINE DRAINAGE (UROLOGICAL SUPPLIES) IMPLANT
BAG URO CATCHER STRL LF (MISCELLANEOUS) ×4 IMPLANT
BASKET LASER NITINOL 1.9FR (BASKET) IMPLANT
BSKT STON RTRVL 120 1.9FR (BASKET)
CATH FOLEY 2WAY SLVR 30CC 24FR (CATHETERS) ×3 IMPLANT
CATH INTERMIT  6FR 70CM (CATHETERS) ×4 IMPLANT
CLOTH BEACON ORANGE TIMEOUT ST (SAFETY) ×4 IMPLANT
COVER FOOTSWITCH UNIV (MISCELLANEOUS) IMPLANT
COVER SURGICAL LIGHT HANDLE (MISCELLANEOUS) ×2 IMPLANT
ELECT REM PT RETURN 15FT ADLT (MISCELLANEOUS) ×2 IMPLANT
EVACUATOR MICROVAS BLADDER (UROLOGICAL SUPPLIES) IMPLANT
FIBER LASER FLEXIVA 1000 (UROLOGICAL SUPPLIES) IMPLANT
FIBER LASER FLEXIVA 365 (UROLOGICAL SUPPLIES) IMPLANT
FIBER LASER FLEXIVA 550 (UROLOGICAL SUPPLIES) IMPLANT
FIBER LASER TRAC TIP (UROLOGICAL SUPPLIES) IMPLANT
GLOVE BIOGEL M STRL SZ7.5 (GLOVE) ×4 IMPLANT
GOWN STRL REUS W/TWL LRG LVL3 (GOWN DISPOSABLE) ×8 IMPLANT
GUIDEWIRE ANG ZIPWIRE 038X150 (WIRE) ×1 IMPLANT
GUIDEWIRE STR DUAL SENSOR (WIRE) ×4 IMPLANT
HOLDER FOLEY CATH W/STRAP (MISCELLANEOUS) ×2 IMPLANT
LOOP CUT BIPOLAR 24F LRG (ELECTROSURGICAL) ×3 IMPLANT
MANIFOLD NEPTUNE II (INSTRUMENTS) ×4 IMPLANT
PACK CYSTO (CUSTOM PROCEDURE TRAY) ×4 IMPLANT
SET ASPIRATION TUBING (TUBING) IMPLANT
SHEATH ACCESS URETERAL 24CM (SHEATH) IMPLANT
SHEATH ACCESS URETERAL 54CM (SHEATH) IMPLANT
SHEATH URETERAL 12FRX35CM (MISCELLANEOUS) IMPLANT
SYR CONTROL 10ML LL (SYRINGE) ×4 IMPLANT
SYRINGE IRR TOOMEY STRL 70CC (SYRINGE) IMPLANT
TUBE FEEDING 8FR 16IN STR KANG (MISCELLANEOUS) ×2 IMPLANT
TUBING CONNECTING 10 (TUBING) ×3 IMPLANT
TUBING CONNECTING 10' (TUBING) ×1

## 2017-08-25 NOTE — Anesthesia Postprocedure Evaluation (Signed)
Anesthesia Post Note  Patient: Tanya Martinez  Procedure(s) Performed: TRANSURETHRAL RESECTION OF BLADDER TUMOR (TURBT) (N/A Bladder) CYSTOSCOPY WITH BILATERAL RETROGRADE PYELOGRAM (Bilateral Urethra)     Patient location during evaluation: PACU Anesthesia Type: General Level of consciousness: awake and alert Pain management: pain level controlled Vital Signs Assessment: post-procedure vital signs reviewed and stable Respiratory status: spontaneous breathing, nonlabored ventilation, respiratory function stable and patient connected to nasal cannula oxygen Cardiovascular status: blood pressure returned to baseline and stable Postop Assessment: no apparent nausea or vomiting Anesthetic complications: no    Last Vitals:  Vitals:   08/25/17 1800 08/25/17 1810  BP: (!) 146/60 (!) 145/63  Pulse: 65 64  Resp: 17 15  Temp:  36.5 C  SpO2: 98% 100%    Last Pain:  Vitals:   08/25/17 1810  TempSrc:   PainSc: 2                  Barnet Glasgow

## 2017-08-25 NOTE — Plan of Care (Signed)
  Clinical Measurements: Diagnostic test results will improve 08/25/2017 2304 - Progressing by Ashley Murrain, RN

## 2017-08-25 NOTE — Brief Op Note (Signed)
08/25/2017  5:28 PM  PATIENT:  Tanya Martinez  84 y.o. female  PRE-OPERATIVE DIAGNOSIS:  BLADDER CANCER  POST-OPERATIVE DIAGNOSIS:  BLADDER CANCER  PROCEDURE:  Procedure(s): TRANSURETHRAL RESECTION OF BLADDER TUMOR (TURBT) (N/A) CYSTOSCOPY WITH BILATERAL RETROGRADE PYELOGRAM (Bilateral)  SURGEON:  Surgeon(s) and Role:    * Alexis Frock, MD - Primary  PHYSICIAN ASSISTANT:   ASSISTANTS: none   ANESTHESIA:   general  EBL:  80mL  BLOOD ADMINISTERED:none  DRAINS: 36F 3 way foley to straight drain. Irriagation port plugged.    LOCAL MEDICATIONS USED:  NONE  SPECIMEN:  Source of Specimen:  1 - bladder tumor, 2 - base of bladder tumor  DISPOSITION OF SPECIMEN:  PATHOLOGY  COUNTS:  YES  TOURNIQUET:  * No tourniquets in log *  DICTATION: .Other Dictation: Dictation Number [id number]  PLAN OF CARE: Admit for overnight observation  PATIENT DISPOSITION:  PACU - hemodynamically stable.   Delay start of Pharmacological VTE agent (>24hrs) due to surgical blood loss or risk of bleeding: yes

## 2017-08-25 NOTE — Anesthesia Procedure Notes (Addendum)
Procedure Name: LMA Insertion Date/Time: 08/25/2017 4:45 PM Performed by: Victoriano Lain, CRNA Pre-anesthesia Checklist: Patient identified, Emergency Drugs available, Suction available, Patient being monitored and Timeout performed Patient Re-evaluated:Patient Re-evaluated prior to induction Oxygen Delivery Method: Circle system utilized Preoxygenation: Pre-oxygenation with 100% oxygen Induction Type: IV induction Ventilation: Mask ventilation without difficulty LMA: LMA inserted LMA Size: 4.0 Number of attempts: 3 Placement Confirmation: positive ETCO2 and breath sounds checked- equal and bilateral Tube secured with: Tape Dental Injury: Teeth and Oropharynx as per pre-operative assessment  Comments: LMA #4 with gastric port unable to seat properly. Standard LMA #4 seated after some manipulation.

## 2017-08-25 NOTE — Anesthesia Preprocedure Evaluation (Addendum)
Anesthesia Evaluation  Patient identified by MRN, date of birth, ID band Patient awake    Reviewed: Allergy & Precautions, NPO status , Patient's Chart, lab work & pertinent test results  Airway Mallampati: II  TM Distance: >3 FB Neck ROM: Full    Dental no notable dental hx. (+) Teeth Intact   Pulmonary neg pulmonary ROS,  Mild pulmonary fibrosis not on O2   Pulmonary exam normal breath sounds clear to auscultation       Cardiovascular negative cardio ROS Normal cardiovascular exam Rhythm:Regular Rate:Normal     Neuro/Psych negative neurological ROS  negative psych ROS   GI/Hepatic negative GI ROS, Neg liver ROS, GERD  ,  Endo/Other  negative endocrine ROSHypothyroidism   Renal/GU negative Renal ROS  negative genitourinary   Musculoskeletal negative musculoskeletal ROS (+)   Abdominal   Peds negative pediatric ROS (+)  Hematology negative hematology ROS (+)   Anesthesia Other Findings   Reproductive/Obstetrics negative OB ROS                             Anesthesia Physical Anesthesia Plan  ASA: III  Anesthesia Plan: General   Post-op Pain Management:    Induction: Intravenous  PONV Risk Score and Plan: 3 and Treatment may vary due to age or medical condition, Ondansetron and Dexamethasone  Airway Management Planned: LMA and Oral ETT  Additional Equipment:   Intra-op Plan:   Post-operative Plan: Extubation in OR  Informed Consent: I have reviewed the patients History and Physical, chart, labs and discussed the procedure including the risks, benefits and alternatives for the proposed anesthesia with the patient or authorized representative who has indicated his/her understanding and acceptance.     Plan Discussed with: CRNA and Anesthesiologist  Anesthesia Plan Comments:         Anesthesia Quick Evaluation

## 2017-08-25 NOTE — Transfer of Care (Signed)
Immediate Anesthesia Transfer of Care Note  Patient: Tanya Martinez  Procedure(s) Performed: TRANSURETHRAL RESECTION OF BLADDER TUMOR (TURBT) (N/A Bladder) CYSTOSCOPY WITH BILATERAL RETROGRADE PYELOGRAM (Bilateral Urethra)  Patient Location: PACU  Anesthesia Type:General  Level of Consciousness: drowsy and patient cooperative  Airway & Oxygen Therapy: Patient Spontanous Breathing and Patient connected to face mask oxygen  Post-op Assessment: Report given to RN and Post -op Vital signs reviewed and stable  Post vital signs: Reviewed and stable  Last Vitals:  Vitals:   08/25/17 1424  BP: 132/74  Pulse: 83  Resp: 16  Temp: 36.6 C  SpO2: 98%    Last Pain:  Vitals:   08/25/17 1424  TempSrc: Oral         Complications: No apparent anesthesia complications

## 2017-08-25 NOTE — H&P (Signed)
Tanya Martinez is an 83 y.o. female.    Chief Complaint: Pre-OP Transurethral Resection of Bladder Tumor  HPI:   1 - Bladder Mass / Possible Recurrent Endometrial Cancer - 4cm PET-avid rt posterior / dome bladder mass on staging PET for ER+/PR+ recurrent endometrial cancer with known vaginal recurrence. Had primary hysterectomy in 1989. Tentatively considering pelvic XRT and hormones under care of Dr. Sondra Come pending characterization of bladder mass histology. Has also had opinion from Dr. Denman George as well. Non smoker. Has had some hematuria. Cr 0.8. NO hydro by CT 07/2017.    PMH sig for mild pulm fibrosis (very mild, no O2), hypothyroid, hyst. NO ischemic CV disease / blood thinners. . Her PCP is Donnie Coffin MD.   Today "Tanya Martinez" is seen to proceed with transurethral resection of bladder tumor. NO interval fevers. Most recent UCX negative.    Past Medical History:  Diagnosis Date  . Acid reflux   . Colon polyps   . Glaucoma   . Hypothyroidism   . Pneumonia   . Uterine cancer [facility])     Past Surgical History:  Procedure Laterality Date  . BREAST LUMPECTOMY     2 lumps removed from rt breast  . CESAREAN SECTION    . COLONOSCOPY    . EYE SURGERY     detached retina  . HEMORRHOID SURGERY    . HYSTERECTOMY ABDOMINAL WITH SALPINGECTOMY  1989  . THYROID SURGERY     growth removed    Family History  Problem Relation Age of Onset  . Heart attack Mother   . Heart disease Mother   . Heart attack Brother   . Heart disease Brother   . Heart attack Sister   . Heart disease Sister   . Stroke Father    Social History:  reports that  has never smoked. she has never used smokeless tobacco. She reports that she drinks alcohol. She reports that she does not use drugs.  Allergies:  Allergies  Allergen Reactions  . Azithromycin     GI Upset (intolerance)  . Shellfish Allergy Nausea Only  . Sulfa Antibiotics Other (See Comments)    "Lips felt numb"    No medications prior to admission.     No results found for this or any previous visit (from the past 48 hour(s)). No results found.  Review of Systems  Constitutional: Negative.  Negative for chills and fever.  HENT: Negative.   Eyes: Negative.   Respiratory: Negative.   Cardiovascular: Negative.   Gastrointestinal: Negative.   Genitourinary: Negative for flank pain and hematuria.  Musculoskeletal: Negative.   Skin: Negative.   Endo/Heme/Allergies: Negative.   Psychiatric/Behavioral: Negative.     There were no vitals taken for this visit. Physical Exam  Constitutional: She appears well-developed.  Vigorous for age  HENT:  Head: Normocephalic.  Eyes: Pupils are equal, round, and reactive to light.  Neck: Normal range of motion.  Cardiovascular: Normal rate.  Respiratory: Effort normal.  GI: Soft.  Genitourinary:  Genitourinary Comments: NO CVAT at present.   Musculoskeletal: Normal range of motion.  Skin: Skin is warm.  Psychiatric: She has a normal mood and affect.     Assessment/Plan  Proceed as planned with cysto, retrogrades, TURBT with goal of local resection and histologic confirmation of bladder mass. Risks, benefits, alternatives, expected peri-op course discussed previously and reiterated today.     Alexis Frock, MD 08/25/2017, 8:33 AM

## 2017-08-26 ENCOUNTER — Encounter (HOSPITAL_COMMUNITY): Payer: Self-pay | Admitting: Urology

## 2017-08-26 DIAGNOSIS — J8410 Pulmonary fibrosis, unspecified: Secondary | ICD-10-CM | POA: Diagnosis not present

## 2017-08-26 DIAGNOSIS — Z90710 Acquired absence of both cervix and uterus: Secondary | ICD-10-CM | POA: Diagnosis not present

## 2017-08-26 DIAGNOSIS — C679 Malignant neoplasm of bladder, unspecified: Secondary | ICD-10-CM | POA: Diagnosis not present

## 2017-08-26 DIAGNOSIS — Z8542 Personal history of malignant neoplasm of other parts of uterus: Secondary | ICD-10-CM | POA: Diagnosis not present

## 2017-08-26 MED ORDER — SENNOSIDES-DOCUSATE SODIUM 8.6-50 MG PO TABS: 1.0000 | ORAL_TABLET | Freq: Two times a day (BID) | ORAL | 0 refills | Status: DC

## 2017-08-26 MED ORDER — TRAMADOL HCL 50 MG PO TABS: 50.0000 mg | ORAL_TABLET | Freq: Four times a day (QID) | ORAL | 0 refills | Status: DC | PRN

## 2017-08-26 NOTE — Discharge Summary (Signed)
Physician Discharge Summary  Patient ID: Tanya Martinez MRN: [id number] DOB/AGE: [birth year] 84 y.o.  Admit date: 08/25/2017 Discharge date: 08/26/2017  Admission Diagnoses:  Discharge Diagnoses:  Active Problems:   Bladder cancer [facility])   Discharged Condition: good  Hospital Course:   1 - Bladder Neoplasm - s/p transurethral resection of large bladder tumor on 08/25/17, the day of admission, without acute complication. Given volume of tumor and her advanced age, she was observed overnight for observation. Foley removed AM POD 1 as output clear and she voided spontaneously x several. By the early afternoon POD 1 she is ambulatory, pain controlled on PO meds, maintaining PO hydration, and felt to be adequate for discharge. Path pending as discharge.   Consults: None  Significant Diagnostic Studies: labs: as per above  Treatments: surgery: as per above  Discharge Exam: Blood pressure 109/62, pulse (!) 55, temperature 98.2 F (36.8 C), temperature source Oral, resp. rate 16, height 5\' 4"  (1.626 m), weight 60.3 kg (133 lb), SpO2 95 %. General appearance: alert, cooperative, appears stated age and very vigorous for age, at baseline. daughter with her as well.  Eyes: negative Nose: Nares normal. Septum midline. Mucosa normal. No drainage or sinus tenderness. Throat: lips, mucosa, and tongue normal; teeth and gums normal Neck: supple, symmetrical, trachea midline Back: symmetric, no curvature. ROM normal. No CVA tenderness. Resp: non-labored on room air.  Cardio: Nl rate GI: soft, non-tender; bowel sounds normal; no masses,  no organomegaly Pelvic: external genitalia normal and catheter now out Extremities: extremities normal, atraumatic, no cyanosis or edema Pulses: 2+ and symmetric Skin: Skin color, texture, turgor normal. No rashes or lesions Lymph nodes: Cervical, supraclavicular, and axillary nodes normal. Neurologic: Grossly normal  Disposition: 01-Home or Self Care   Allergies  as of 08/26/2017      Reactions   Azithromycin    GI Upset (intolerance)   Shellfish Allergy Nausea Only   Sulfa Antibiotics Other (See Comments)   "Lips felt numb"      Medication List    TAKE these medications   albuterol 108 (90 Base) MCG/ACT inhaler Commonly known as:  PROVENTIL HFA;VENTOLIN HFA Inhale 1-2 puffs into the lungs every 4 (four) hours as needed for wheezing or shortness of breath.   CALTRATE 600+D PLUS MINERALS 600-800 MG-UNIT Tabs Take 1 tablet by mouth daily.   latanoprost 0.005 % ophthalmic solution Commonly known as:  XALATAN Place 1 drop into the right eye at bedtime.   levothyroxine 50 MCG tablet Commonly known as:  SYNTHROID, LEVOTHROID Take 50 mcg by mouth daily before breakfast.   omeprazole 20 MG tablet Commonly known as:  PRILOSEC OTC Take 20 mg by mouth daily.   senna-docusate 8.6-50 MG tablet Commonly known as:  Senokot-S Take 1 tablet by mouth 2 (two) times daily. While taking strong pain meds to prevent constipation   traMADol 50 MG tablet Commonly known as:  ULTRAM Take 1 tablet (50 mg total) by mouth every 6 (six) hours as needed for moderate pain or severe pain. Post-operatively        SignedAlexis Frock 08/26/2017, 12:17 PM

## 2017-08-26 NOTE — Discharge Instructions (Signed)
1 - You may have urinary urgency (bladder spasms) and bloody urine on / off post-operatively. This is normal.  2 - Call MD or go to ER for fever >102, severe pain / nausea / vomiting not relieved by medications, or acute change in medical status.

## 2017-08-26 NOTE — Op Note (Signed)
NAME:  Tanya Martinez, Tanya Martinez                     ACCOUNT NO.:  MEDICAL RECORD NO.:  [id number]  LOCATION:                                 FACILITY:  PHYSICIAN:  Alexis Frock, MD          DATE OF BIRTH:  DATE OF PROCEDURE:  08/25/2017                                OPERATIVE REPORT   PREOPERATIVE DIAGNOSIS:  Large dome bladder tumor.  POSTOPERATIVE DIAGNOSIS:  Large dome bladder tumor.  History of endometrial cancer.  PROCEDURES: 1. Transurethral resection of bladder tumor, volume large. 2. Bilateral retrograde pyelogram with interpretation.  ESTIMATED BLOOD LOSS:  50 mL.  COMPLICATION:  None.  SPECIMENS: 1. Bladder tumor, history of endometrial cancer. 2. Base of bladder tumor.  FINDINGS: 1. Approximately 6-cm dome nodular tumor, very vascular. 2. Unremarkable bilateral retrograde pyelograms. 3. Complete resolution of all visible intraluminal tumor following     transurethral resection. 4. No evidence of bladder perforation despite large tumor resection.  DRAIN:  A 24-French 3-way Foley catheter to straight drain.  Irrigation port was plugged.  INDICATION:  Ms. Holtmeyer is a very pleasant and quite vigorous 84 year old woman with remote history of endometrial cancer.  It was felt to be a local vaginal recurrence.  She was found on staging of this to have a large vascular bladder dome mass as well as history of hematuria and debris passage.  Office cystoscopy corroborated a large dome tumor, no obvious locally advanced metastatic disease was noted.  It was clearly felt that transurethral resection for diagnosis and treatment purposes was warranted.  Informed consent was obtained and placed in the medical record.  PROCEDURE IN DETAIL:  The patient being Tanya Martinez, was verified. Procedure being transurethral resection of bladder tumor was confirmed. Procedure was carried out.  Time-out was performed.  Intravenous antibiotics were administered.  General LMA anesthesia was  introduced. The patient was placed into a low lithotomy position.  Sterile field was created by prepping and draping the patient's vagina, introitus and proximal thighs using iodine.  Next, cystourethroscopy was performed using a 26-French resectoscope sheath with visual obturator.  Inspection of the urinary bladder revealed a very large and nodular quite vascular- appearing tumor from the dome of the posterior dome of the bladder. Ureteral orifices were unremarkable.  There were no additional lesions, this was nonpapillary.  Using medium resectoscope loop, very careful systematic resection was performed down what appeared to be the superficial fibromuscular stroma of the urinary bladder.  This was quite vascular as anticipated.  The bladder tumor fragments were irrigated and set aside for permanent pathology, labeled as bladder tumor, history of endometrial cancer.  Next, cold cup biopsy forceps were used to obtain base seromuscular bites, set aside as separate specimen, labeled the base of bladder tumor.  Following these maneuvers, hemostasis appeared excellent.  There was no evidence of bladder perforation.  Attention was directed at bilateral retrograde pyelograms.  The left ureteral orifice was cannulated with a 6-French end-hole catheter and left retrograde pyelogram was obtained.  Left retrograde pyelogram demonstrated a single left ureter with single- system left kidney.  No filling defects or narrowing noted.  Similarly, right retrograde pyelogram was obtained.  Right retrograde pyelogram demonstrated a single right ureter with single-system right kidney.  No filling defects or narrowing noted. Given the large volume of tumor resection and the patient's advanced age, it was felt that overnight observation was warranted with temporary catheterization.  As such, a new 24-French 3-way Foley catheter was placed per urethra to straight drain, 10 mL sterile in the balloon.   The irrigation port was plugged.  The efflux was completely clear. Procedure was then terminated.  The patient tolerated the procedure well.  There were no immediate periprocedural complications.  The patient was taken to the postanesthesia care unit in stable condition.          ______________________________ Alexis Frock, MD     TM/MEDQ  D:  08/25/2017  T:  08/25/2017  Job:  [id number]

## 2017-09-07 DIAGNOSIS — C541 Malignant neoplasm of endometrium: Secondary | ICD-10-CM | POA: Diagnosis not present

## 2017-09-07 DIAGNOSIS — C674 Malignant neoplasm of posterior wall of bladder: Secondary | ICD-10-CM | POA: Diagnosis not present

## 2017-09-10 NOTE — Progress Notes (Signed)
GYN Location of Tumor / Histology: recurrent endometrial carcinoma (grade 1) at the distal vagina.  Tanya Martinez presented with symptoms of: In mid October, 2018 she noticed vaginal bleeding.   Biopsies revealed:   08/25/17 Diagnosis 1. Bladder, transurethral resection - ADENOCARCINOMA WITH SQUAMOUS DIFFERENTIATION - SEE COMMENT 2. Bladder, transurethral resection, base of bladder tumor - NO CARCINOMA IDENTIFIED Microscopic Comment 1. The biopsy material is of an adenocarcinoma with squamous differentiation; this is morphologically consistent with the metastasis from the patient's previously diagnosed endometrioid carcinoma.  06/16/17   Past/Anticipated interventions by Gyn/Onc surgery, if any: 08/25/17 - Procedure: TRANSURETHRAL RESECTION OF BLADDER TUMOR (TURBT);  Surgeon: Alexis Frock, MD.  Hysterectomy in 1989.  Past/Anticipated interventions by medical oncology, if any: no  Weight changes, if any: has lost about 15 lbs in the last year.  Reports having a poor appetite.  Bowel/Bladder complaints, if any: urinary frequency since surgery  Nausea/Vomiting, if any: no  Pain issues, if any:  no  SAFETY ISSUES:  Prior radiation? no  Pacemaker/ICD? no  Possible current pregnancy? no  Is the patient on methotrexate? no  Current Complaints / other details: Patient denies having any hematuria or vaginal bleeding.  BP (!) 141/70 (BP Location: Left Arm, Patient Position: Sitting)   Pulse 74   Temp 97.7 F (36.5 C) (Oral)   Ht 5\' 4"  (1.626 m)   Wt 134 lb (60.8 kg)   SpO2 99%   BMI 23.00 kg/m    Wt Readings from Last 3 Encounters:  09/15/17 134 lb (60.8 kg)  08/25/17 133 lb (60.3 kg)  07/22/17 134 lb 12.8 oz (61.1 kg)

## 2017-09-15 ENCOUNTER — Encounter: Payer: Self-pay | Admitting: Radiation Oncology

## 2017-09-15 ENCOUNTER — Ambulatory Visit
Admission: RE | Admit: 2017-09-15 | Discharge: 2017-09-15 | Disposition: A | Discharge status: Home / Self Care | Payer: Medicare Other | Source: Ambulatory Visit | Attending: Radiation Oncology | Admitting: Radiation Oncology

## 2017-09-15 ENCOUNTER — Other Ambulatory Visit: Payer: Self-pay

## 2017-09-15 VITALS — BP 141/70 | HR 74 | Temp 97.7°F | Ht 64.0 in | Wt 134.0 lb

## 2017-09-15 DIAGNOSIS — H409 Unspecified glaucoma: Secondary | ICD-10-CM | POA: Diagnosis not present

## 2017-09-15 DIAGNOSIS — Z882 Allergy status to sulfonamides status: Secondary | ICD-10-CM | POA: Diagnosis not present

## 2017-09-15 DIAGNOSIS — Z86010 Personal history of colonic polyps: Secondary | ICD-10-CM | POA: Diagnosis not present

## 2017-09-15 DIAGNOSIS — Z6823 Body mass index (BMI) 23.0-23.9, adult: Secondary | ICD-10-CM | POA: Diagnosis not present

## 2017-09-15 DIAGNOSIS — C7982 Secondary malignant neoplasm of genital organs: Secondary | ICD-10-CM

## 2017-09-15 DIAGNOSIS — Z8249 Family history of ischemic heart disease and other diseases of the circulatory system: Secondary | ICD-10-CM | POA: Diagnosis not present

## 2017-09-15 DIAGNOSIS — R634 Abnormal weight loss: Secondary | ICD-10-CM | POA: Diagnosis not present

## 2017-09-15 DIAGNOSIS — C674 Malignant neoplasm of posterior wall of bladder: Secondary | ICD-10-CM

## 2017-09-15 DIAGNOSIS — C541 Malignant neoplasm of endometrium: Secondary | ICD-10-CM

## 2017-09-15 DIAGNOSIS — K219 Gastro-esophageal reflux disease without esophagitis: Secondary | ICD-10-CM | POA: Diagnosis not present

## 2017-09-15 DIAGNOSIS — C671 Malignant neoplasm of dome of bladder: Secondary | ICD-10-CM | POA: Diagnosis not present

## 2017-09-15 DIAGNOSIS — Z91013 Allergy to seafood: Secondary | ICD-10-CM | POA: Diagnosis not present

## 2017-09-15 DIAGNOSIS — E039 Hypothyroidism, unspecified: Secondary | ICD-10-CM | POA: Diagnosis not present

## 2017-09-15 DIAGNOSIS — K5909 Other constipation: Secondary | ICD-10-CM | POA: Diagnosis not present

## 2017-09-15 DIAGNOSIS — Z79899 Other long term (current) drug therapy: Secondary | ICD-10-CM | POA: Diagnosis not present

## 2017-09-15 DIAGNOSIS — N939 Abnormal uterine and vaginal bleeding, unspecified: Secondary | ICD-10-CM | POA: Diagnosis not present

## 2017-09-15 DIAGNOSIS — Z79890 Hormone replacement therapy (postmenopausal): Secondary | ICD-10-CM | POA: Diagnosis not present

## 2017-09-15 DIAGNOSIS — Z881 Allergy status to other antibiotic agents status: Secondary | ICD-10-CM | POA: Diagnosis not present

## 2017-09-15 DIAGNOSIS — Z90710 Acquired absence of both cervix and uterus: Secondary | ICD-10-CM | POA: Diagnosis not present

## 2017-09-15 DIAGNOSIS — Z90722 Acquired absence of ovaries, bilateral: Secondary | ICD-10-CM | POA: Diagnosis not present

## 2017-09-15 DIAGNOSIS — Z823 Family history of stroke: Secondary | ICD-10-CM | POA: Diagnosis not present

## 2017-09-15 LAB — CBC WITH DIFFERENTIAL (CANCER CENTER ONLY)
BASOS ABS: 0 10*3/uL (ref 0.0–0.1)
BASOS PCT: 1 %
EOS PCT: 2 %
Eosinophils Absolute: 0.1 10*3/uL (ref 0.0–0.5)
HCT: 41.5 % (ref 34.8–46.6)
Hemoglobin: 13.7 g/dL (ref 11.6–15.9)
LYMPHS PCT: 19 %
Lymphs Abs: 1.1 10*3/uL (ref 0.9–3.3)
MCH: 32.5 pg (ref 25.1–34.0)
MCHC: 33.2 g/dL (ref 31.5–36.0)
MCV: 98 fL (ref 79.5–101.0)
Monocytes Absolute: 0.6 10*3/uL (ref 0.1–0.9)
Monocytes Relative: 11 %
NEUTROS ABS: 3.8 10*3/uL (ref 1.5–6.5)
Neutrophils Relative %: 67 %
PLATELETS: 178 10*3/uL (ref 145–400)
RBC: 4.23 MIL/uL (ref 3.70–5.45)
RDW: 13 % (ref 11.2–14.5)
WBC: 5.6 10*3/uL (ref 3.9–10.3)

## 2017-09-15 LAB — BASIC METABOLIC PANEL - CANCER CENTER ONLY
Anion gap: 9 (ref 3–11)
BUN: 16 mg/dL (ref 7–26)
CHLORIDE: 105 mmol/L (ref 98–109)
CO2: 27 mmol/L (ref 22–29)
Calcium: 9.2 mg/dL (ref 8.4–10.4)
Creatinine: 0.8 mg/dL (ref 0.60–1.10)
Glucose, Bld: 98 mg/dL (ref 70–140)
Potassium: 4.1 mmol/L (ref 3.5–5.1)
Sodium: 141 mmol/L (ref 136–145)

## 2017-09-15 NOTE — Progress Notes (Signed)
Radiation Oncology         [phone] ________________________________  Name: Tanya Martinez MRN: [id number]  Date: 09/15/2017  DOB: 1922/12/12  Follow-Up Visit Note  CC: Alroy Dust, L.Marlou Sa, MD  Everitt Amber, MD    ICD-10-CM   1. Endometrial cancer, grade I ([place]) C54.1 CBC with Differential (Cancer Center Only)    [facility] Only  2. 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 Malignant neoplasm of posterior wall of urinary bladder ([facility]) C67.4     Diagnosis:   84 y.o.year old with recurrent endometrial carcinoma (grade 1) at the mid vagina area as well as posterior dome of the bladder   Narrative:  The patient returns today for reevaluation and consideration for radiation therapy as part of management of her recurrent endometrial cancer. Since the patient's initial consultation December 6 she proceeded to undergo a PET scan which showed a mass along the posterior bladder. She was seen by Dr. Tresa Moore and on cystoscopy the patient was noted to have a mass within the bladder. This was felt to be potentially resectable and the patient was taken to the operating room on January 6 at which time she underwent transurethral resection of bladder tumor, large volume. At the time of the procedure,  the patient was noted to have a 6 cm dome nodular tumor which was very vascular. Complete resection of this area was carried out with no visible intraluminal tumor noted following the transurethral resection. There was no evidence of bladder perforation from the large tumor.Patient is recovering well from her procedure.   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   Since her surgery the patient denies any hematuria or vaginal bleeding. She denies any pelvic pain. She has some mild fatigue.                             ALLERGIES:   is allergic to azithromycin; shellfish allergy; and sulfa antibiotics.  Meds: Current Outpatient Medications  Medication Sig Dispense Refill  . albuterol (PROVENTIL HFA;VENTOLIN HFA) 108 (90 Base) MCG/ACT inhaler Inhale 1-2 puffs into the lungs every 4 (four) hours as needed for wheezing or shortness of breath.    . Calcium Carbonate-Vit D-Min (CALTRATE 600+D PLUS MINERALS) 600-800 MG-UNIT TABS Take 1 tablet by mouth daily.     Marland Kitchen latanoprost (XALATAN) 0.005 % ophthalmic solution Place 1 drop into the right eye at bedtime.    Marland Kitchen levothyroxine (SYNTHROID, LEVOTHROID) 50 MCG tablet Take 50 mcg by mouth daily before breakfast.     . omeprazole (PRILOSEC OTC) 20 MG tablet Take 20 mg by mouth daily.    Marland Kitchen senna-docusate (SENOKOT-S) 8.6-50 MG tablet Take 1 tablet by mouth 2 (two) times daily. While taking strong pain meds to prevent constipation (Patient not taking: Reported on 09/15/2017) 20 tablet 0   No current facility-administered medications for this encounter.     Physical Findings: The patient is in no acute distress. Patient is alert and oriented.  height is 5\' 4"  (1.626 m) and weight is 134 lb (60.8 kg). Her oral temperature is 97.7 F (36.5 C). Her blood pressure is 141/70 (abnormal) and her pulse is 74. Her oxygen saturation is 99%. .  Lungs are clear to auscultation bilaterally. Heart has regular rate and rhythm. No palpable cervical, supraclavicular, or axillary adenopathy. Abdomen soft,  non-tender, normal bowel sounds. A pelvic exam is performed. The external genitalia are unremarkable. Speculum exam is performed. Small speculum was required due to the patient's narrow vaginal vault. No obvious mucosal lesions in the vaginal vault but on digital exam patient was noted to have a palpable ridge along the left lateral vaginal vault. This seems to be very superficial, approximately 1.5 -2.0 cm in size. sLightly enlarged compared to previous exam in early December of last year    Lab  Findings: Lab Results  Component Value Date   WBC 5.6 09/15/2017   HGB 14.4 08/25/2017   HCT 41.5 09/15/2017   MCV 98.0 09/15/2017   PLT 178 09/15/2017    Radiographic Findings: Dg C-arm 1-60 Min-no Report  Result Date: 08/25/2017 Fluoroscopy was utilized by the requesting physician.  No radiographic interpretation.    Impression:  Recurrent endometrial cancer. The patient has documented recurrence within the bladder as well as vaginal vault. I discussed the most recent surgical and pathologic findings with the patient and her daughter will. We discussed options for management including no further treatment ( watchful waiting) in light of her advanced age. We also discussed consideration for external beam radiation therapy directed at the pelvis region and finally we discussed external beam radiation therapy directed at the pelvis in addition vaginal vault brachytherapy. My recommendation for the patient would be to proceed with external beam and vaginal brachytherapy to give her best chance for  remission. I discussed the course of treatment side effects and potential toxicities of radiation therapy in this situation with the patient and her daughter. The patient appears to understand and wishes to proceed with planned course of treatment.  Plan:  Patient will proceed with simulation early next week. Anticipate 45 gray in 25 fractions directed at the pelvis region. This will then be followed by 3-4 intracavitary brachytherapy treatments to address the vaginal vault recurrence.. I am recommending intensity modulated radiation therapy for her external beam radiation therapy to limit dose to small bowel.  ____________________________________ Gery Pray, MD

## 2017-09-20 ENCOUNTER — Ambulatory Visit
Admission: RE | Admit: 2017-09-20 | Discharge: 2017-09-20 | Disposition: A | Discharge status: Home / Self Care | Payer: Medicare Other | Source: Ambulatory Visit | Attending: Radiation Oncology | Admitting: Radiation Oncology

## 2017-09-20 VITALS — BP 144/70 | HR 70 | Temp 98.0°F | Ht 64.0 in | Wt 134.2 lb

## 2017-09-20 DIAGNOSIS — C7982 Secondary malignant neoplasm of genital organs: Secondary | ICD-10-CM

## 2017-09-20 DIAGNOSIS — C541 Malignant neoplasm of endometrium: Secondary | ICD-10-CM | POA: Diagnosis not present

## 2017-09-20 DIAGNOSIS — Z510 Encounter for antineoplastic radiation therapy: Secondary | ICD-10-CM | POA: Diagnosis not present

## 2017-09-20 MED ORDER — SODIUM CHLORIDE 0.9 % IJ SOLN
10.0000 mL | Freq: Once | INTRAMUSCULAR | Status: AC
  Administered 2017-09-20: 10 mL via INTRAVENOUS

## 2017-09-20 NOTE — Progress Notes (Signed)
Has armband been applied?  Yes.    Does patient have an allergy to IV contrast dye?: No.   Has patient ever received premedication for IV contrast dye?: No.   Does patient take metformin?: No.  If patient does take metformin when was the last dose: n/a  Date of lab work: September 15, 2017 BUN: 16 CR: 0.80  IV site: antecubital left, condition patent and no redness  Has IV site been added to flowsheet?  Yes.    There were no vitals taken for this visit.

## 2017-09-25 NOTE — Progress Notes (Signed)
  Radiation Oncology         [phone] ________________________________  Name: Tanya Martinez MRN: [id number]  Date: 09/20/2017  DOB: 1923-05-02  SIMULATION AND TREATMENT PLANNING NOTE    ICD-10-CM   1. Endometrial cancer, grade I ([place]) C54.1   2. 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  Identity was confirmed.  All relevant records and images related to the planned course of therapy were reviewed.  The patient freely provided informed written consent to proceed with treatment after reviewing the details related to the planned course of therapy. The consent form was witnessed and verified by the simulation staff.  Then, the patient was set-up in a stable reproducible  supine position for radiation therapy.  CT images were obtained.  Surface markings were placed.  The CT images were loaded into the planning software.  Then the target and avoidance structures were contoured.  Treatment planning then occurred.  The radiation prescription was entered and confirmed.  Then, I designed and supervised the construction of a total of 2 medically necessary complex treatment devices.  I have requested : Intensity Modulated Radiotherapy (IMRT) is medically necessary for this case for the following reason:  Small bowel sparing..  I have ordered:CBC  PLAN:  The patient will receive 45 Gy in 25 fractions followed by 3-4 intracavitary brachytherapy treatments directed at the vaginal region.  -----------------------------------  Blair Promise, PhD, MD

## 2017-09-28 DIAGNOSIS — C7982 Secondary malignant neoplasm of genital organs: Secondary | ICD-10-CM | POA: Diagnosis not present

## 2017-09-28 DIAGNOSIS — Z510 Encounter for antineoplastic radiation therapy: Secondary | ICD-10-CM | POA: Diagnosis not present

## 2017-09-28 DIAGNOSIS — C541 Malignant neoplasm of endometrium: Secondary | ICD-10-CM | POA: Diagnosis not present

## 2017-09-29 ENCOUNTER — Ambulatory Visit
Admission: RE | Admit: 2017-09-29 | Discharge: 2017-09-29 | Disposition: A | Discharge status: Home / Self Care | Payer: Medicare Other | Source: Ambulatory Visit | Attending: Radiation Oncology | Admitting: Radiation Oncology

## 2017-09-29 DIAGNOSIS — C7982 Secondary malignant neoplasm of genital organs: Secondary | ICD-10-CM | POA: Diagnosis not present

## 2017-09-29 DIAGNOSIS — Z510 Encounter for antineoplastic radiation therapy: Secondary | ICD-10-CM | POA: Diagnosis not present

## 2017-09-29 DIAGNOSIS — C541 Malignant neoplasm of endometrium: Secondary | ICD-10-CM | POA: Diagnosis not present

## 2017-09-30 ENCOUNTER — Ambulatory Visit
Admission: RE | Admit: 2017-09-30 | Discharge: 2017-09-30 | Disposition: A | Discharge status: Home / Self Care | Payer: Medicare Other | Source: Ambulatory Visit | Attending: Radiation Oncology | Admitting: Radiation Oncology

## 2017-09-30 DIAGNOSIS — C541 Malignant neoplasm of endometrium: Secondary | ICD-10-CM | POA: Diagnosis not present

## 2017-09-30 DIAGNOSIS — Z510 Encounter for antineoplastic radiation therapy: Secondary | ICD-10-CM | POA: Diagnosis not present

## 2017-09-30 DIAGNOSIS — C7982 Secondary malignant neoplasm of genital organs: Secondary | ICD-10-CM | POA: Diagnosis not present

## 2017-10-01 ENCOUNTER — Ambulatory Visit
Admission: RE | Admit: 2017-10-01 | Discharge: 2017-10-01 | Disposition: A | Discharge status: Home / Self Care | Payer: Medicare Other | Source: Ambulatory Visit | Attending: Radiation Oncology | Admitting: Radiation Oncology

## 2017-10-01 DIAGNOSIS — C7982 Secondary malignant neoplasm of genital organs: Secondary | ICD-10-CM | POA: Diagnosis not present

## 2017-10-01 DIAGNOSIS — Z510 Encounter for antineoplastic radiation therapy: Secondary | ICD-10-CM | POA: Diagnosis not present

## 2017-10-01 DIAGNOSIS — C541 Malignant neoplasm of endometrium: Secondary | ICD-10-CM | POA: Diagnosis not present

## 2017-10-04 ENCOUNTER — Ambulatory Visit
Admission: RE | Admit: 2017-10-04 | Discharge: 2017-10-04 | Disposition: A | Discharge status: Home / Self Care | Payer: Medicare Other | Source: Ambulatory Visit | Attending: Radiation Oncology | Admitting: Radiation Oncology

## 2017-10-04 DIAGNOSIS — C541 Malignant neoplasm of endometrium: Secondary | ICD-10-CM | POA: Diagnosis not present

## 2017-10-04 DIAGNOSIS — C7982 Secondary malignant neoplasm of genital organs: Secondary | ICD-10-CM | POA: Diagnosis not present

## 2017-10-04 DIAGNOSIS — Z510 Encounter for antineoplastic radiation therapy: Secondary | ICD-10-CM | POA: Diagnosis not present

## 2017-10-05 ENCOUNTER — Ambulatory Visit
Admission: RE | Admit: 2017-10-05 | Discharge: 2017-10-05 | Disposition: A | Discharge status: Home / Self Care | Payer: Medicare Other | Source: Ambulatory Visit | Attending: Radiation Oncology | Admitting: Radiation Oncology

## 2017-10-05 DIAGNOSIS — C541 Malignant neoplasm of endometrium: Secondary | ICD-10-CM | POA: Diagnosis not present

## 2017-10-05 DIAGNOSIS — C7982 Secondary malignant neoplasm of genital organs: Secondary | ICD-10-CM | POA: Diagnosis not present

## 2017-10-05 DIAGNOSIS — Z510 Encounter for antineoplastic radiation therapy: Secondary | ICD-10-CM | POA: Diagnosis not present

## 2017-10-05 NOTE — Progress Notes (Signed)
Pt here for patient teaching.  Pt given Radiation and You booklet and skin care instructions.  Reviewed areas of pertinence such as diarrhea, fatigue, nausea and vomiting, skin changes and urinary and bladder changes . Pt able to give teach back of to pat skin, use unscented/gentle soap, use baby wipes, have Imodium on hand and drink plenty of water,. Pt demonstrated understanding, needs reinforcement, no evidence of learning, refused teaching and  of information given and will contact nursing with any questions or concerns.     [email or web address]

## 2017-10-06 ENCOUNTER — Ambulatory Visit
Admission: RE | Admit: 2017-10-06 | Discharge: 2017-10-06 | Disposition: A | Discharge status: Home / Self Care | Payer: Medicare Other | Source: Ambulatory Visit | Attending: Radiation Oncology | Admitting: Radiation Oncology

## 2017-10-06 DIAGNOSIS — C541 Malignant neoplasm of endometrium: Secondary | ICD-10-CM | POA: Diagnosis not present

## 2017-10-06 DIAGNOSIS — Z510 Encounter for antineoplastic radiation therapy: Secondary | ICD-10-CM | POA: Diagnosis not present

## 2017-10-06 DIAGNOSIS — C7982 Secondary malignant neoplasm of genital organs: Secondary | ICD-10-CM | POA: Diagnosis not present

## 2017-10-07 ENCOUNTER — Ambulatory Visit
Admission: RE | Admit: 2017-10-07 | Discharge: 2017-10-07 | Disposition: A | Discharge status: Home / Self Care | Payer: Medicare Other | Source: Ambulatory Visit | Attending: Radiation Oncology | Admitting: Radiation Oncology

## 2017-10-07 DIAGNOSIS — C7982 Secondary malignant neoplasm of genital organs: Secondary | ICD-10-CM | POA: Diagnosis not present

## 2017-10-07 DIAGNOSIS — Z510 Encounter for antineoplastic radiation therapy: Secondary | ICD-10-CM | POA: Diagnosis not present

## 2017-10-07 DIAGNOSIS — C541 Malignant neoplasm of endometrium: Secondary | ICD-10-CM | POA: Diagnosis not present

## 2017-10-08 ENCOUNTER — Ambulatory Visit: Payer: Medicare Other

## 2017-10-11 ENCOUNTER — Ambulatory Visit
Admission: RE | Admit: 2017-10-11 | Discharge: 2017-10-11 | Disposition: A | Discharge status: Home / Self Care | Payer: Medicare Other | Source: Ambulatory Visit | Attending: Radiation Oncology | Admitting: Radiation Oncology

## 2017-10-11 DIAGNOSIS — C541 Malignant neoplasm of endometrium: Secondary | ICD-10-CM | POA: Diagnosis not present

## 2017-10-11 DIAGNOSIS — C7982 Secondary malignant neoplasm of genital organs: Secondary | ICD-10-CM | POA: Diagnosis not present

## 2017-10-11 DIAGNOSIS — Z510 Encounter for antineoplastic radiation therapy: Secondary | ICD-10-CM | POA: Diagnosis not present

## 2017-10-12 ENCOUNTER — Ambulatory Visit
Admission: RE | Admit: 2017-10-12 | Discharge: 2017-10-12 | Disposition: A | Discharge status: Home / Self Care | Payer: Medicare Other | Source: Ambulatory Visit | Attending: Radiation Oncology | Admitting: Radiation Oncology

## 2017-10-12 DIAGNOSIS — Z510 Encounter for antineoplastic radiation therapy: Secondary | ICD-10-CM | POA: Diagnosis not present

## 2017-10-12 DIAGNOSIS — C7982 Secondary malignant neoplasm of genital organs: Secondary | ICD-10-CM | POA: Diagnosis not present

## 2017-10-12 DIAGNOSIS — C541 Malignant neoplasm of endometrium: Secondary | ICD-10-CM | POA: Diagnosis not present

## 2017-10-13 ENCOUNTER — Ambulatory Visit
Admission: RE | Admit: 2017-10-13 | Discharge: 2017-10-13 | Disposition: A | Discharge status: Home / Self Care | Payer: Medicare Other | Source: Ambulatory Visit | Attending: Radiation Oncology | Admitting: Radiation Oncology

## 2017-10-13 DIAGNOSIS — C541 Malignant neoplasm of endometrium: Secondary | ICD-10-CM | POA: Diagnosis not present

## 2017-10-13 DIAGNOSIS — Z510 Encounter for antineoplastic radiation therapy: Secondary | ICD-10-CM | POA: Diagnosis not present

## 2017-10-13 DIAGNOSIS — C7982 Secondary malignant neoplasm of genital organs: Secondary | ICD-10-CM | POA: Diagnosis not present

## 2017-10-14 ENCOUNTER — Ambulatory Visit
Admission: RE | Admit: 2017-10-14 | Discharge: 2017-10-14 | Disposition: A | Discharge status: Home / Self Care | Payer: Medicare Other | Source: Ambulatory Visit | Attending: Radiation Oncology | Admitting: Radiation Oncology

## 2017-10-14 DIAGNOSIS — Z510 Encounter for antineoplastic radiation therapy: Secondary | ICD-10-CM | POA: Diagnosis not present

## 2017-10-14 DIAGNOSIS — C7982 Secondary malignant neoplasm of genital organs: Secondary | ICD-10-CM | POA: Diagnosis not present

## 2017-10-14 DIAGNOSIS — C541 Malignant neoplasm of endometrium: Secondary | ICD-10-CM | POA: Diagnosis not present

## 2017-10-15 ENCOUNTER — Ambulatory Visit
Admission: RE | Admit: 2017-10-15 | Discharge: 2017-10-15 | Disposition: A | Discharge status: Home / Self Care | Payer: Medicare Other | Source: Ambulatory Visit | Attending: Radiation Oncology | Admitting: Radiation Oncology

## 2017-10-15 DIAGNOSIS — Z510 Encounter for antineoplastic radiation therapy: Secondary | ICD-10-CM | POA: Insufficient documentation

## 2017-10-15 DIAGNOSIS — C541 Malignant neoplasm of endometrium: Secondary | ICD-10-CM | POA: Diagnosis not present

## 2017-10-18 ENCOUNTER — Ambulatory Visit
Admission: RE | Admit: 2017-10-18 | Discharge: 2017-10-18 | Disposition: A | Discharge status: Home / Self Care | Payer: Medicare Other | Source: Ambulatory Visit | Attending: Radiation Oncology | Admitting: Radiation Oncology

## 2017-10-18 DIAGNOSIS — Z510 Encounter for antineoplastic radiation therapy: Secondary | ICD-10-CM | POA: Diagnosis not present

## 2017-10-18 DIAGNOSIS — C541 Malignant neoplasm of endometrium: Secondary | ICD-10-CM | POA: Diagnosis not present

## 2017-10-19 ENCOUNTER — Ambulatory Visit
Admission: RE | Admit: 2017-10-19 | Discharge: 2017-10-19 | Disposition: A | Discharge status: Home / Self Care | Payer: Medicare Other | Source: Ambulatory Visit | Attending: Radiation Oncology | Admitting: Radiation Oncology

## 2017-10-19 DIAGNOSIS — Z510 Encounter for antineoplastic radiation therapy: Secondary | ICD-10-CM | POA: Diagnosis not present

## 2017-10-19 DIAGNOSIS — C541 Malignant neoplasm of endometrium: Secondary | ICD-10-CM | POA: Diagnosis not present

## 2017-10-20 ENCOUNTER — Ambulatory Visit
Admission: RE | Admit: 2017-10-20 | Discharge: 2017-10-20 | Disposition: A | Discharge status: Home / Self Care | Payer: Medicare Other | Source: Ambulatory Visit | Attending: Radiation Oncology | Admitting: Radiation Oncology

## 2017-10-20 DIAGNOSIS — C541 Malignant neoplasm of endometrium: Secondary | ICD-10-CM | POA: Diagnosis not present

## 2017-10-20 DIAGNOSIS — Z510 Encounter for antineoplastic radiation therapy: Secondary | ICD-10-CM | POA: Diagnosis not present

## 2017-10-21 ENCOUNTER — Other Ambulatory Visit: Payer: Self-pay | Admitting: Radiation Oncology

## 2017-10-21 ENCOUNTER — Ambulatory Visit
Admission: RE | Admit: 2017-10-21 | Discharge: 2017-10-21 | Disposition: A | Discharge status: Home / Self Care | Payer: Medicare Other | Source: Ambulatory Visit | Attending: Radiation Oncology | Admitting: Radiation Oncology

## 2017-10-21 DIAGNOSIS — C541 Malignant neoplasm of endometrium: Secondary | ICD-10-CM | POA: Diagnosis not present

## 2017-10-21 DIAGNOSIS — Z510 Encounter for antineoplastic radiation therapy: Secondary | ICD-10-CM | POA: Diagnosis not present

## 2017-10-21 MED ORDER — ESTRADIOL 0.1 MG/GM VA CREA: 1.0000 | TOPICAL_CREAM | Freq: Every day | VAGINAL | 12 refills | Status: DC

## 2017-10-22 ENCOUNTER — Ambulatory Visit
Admission: RE | Admit: 2017-10-22 | Discharge: 2017-10-22 | Disposition: A | Discharge status: Home / Self Care | Payer: Medicare Other | Source: Ambulatory Visit | Attending: Radiation Oncology | Admitting: Radiation Oncology

## 2017-10-22 DIAGNOSIS — Z510 Encounter for antineoplastic radiation therapy: Secondary | ICD-10-CM | POA: Diagnosis not present

## 2017-10-22 DIAGNOSIS — C541 Malignant neoplasm of endometrium: Secondary | ICD-10-CM | POA: Diagnosis not present

## 2017-10-25 ENCOUNTER — Ambulatory Visit
Admission: RE | Admit: 2017-10-25 | Discharge: 2017-10-25 | Disposition: A | Discharge status: Home / Self Care | Payer: Medicare Other | Source: Ambulatory Visit | Attending: Radiation Oncology | Admitting: Radiation Oncology

## 2017-10-25 DIAGNOSIS — C541 Malignant neoplasm of endometrium: Secondary | ICD-10-CM | POA: Diagnosis not present

## 2017-10-25 DIAGNOSIS — Z510 Encounter for antineoplastic radiation therapy: Secondary | ICD-10-CM | POA: Diagnosis not present

## 2017-10-26 ENCOUNTER — Ambulatory Visit
Admission: RE | Admit: 2017-10-26 | Discharge: 2017-10-26 | Disposition: A | Discharge status: Home / Self Care | Payer: Medicare Other | Source: Ambulatory Visit | Attending: Radiation Oncology | Admitting: Radiation Oncology

## 2017-10-26 DIAGNOSIS — C541 Malignant neoplasm of endometrium: Secondary | ICD-10-CM | POA: Diagnosis not present

## 2017-10-26 DIAGNOSIS — Z510 Encounter for antineoplastic radiation therapy: Secondary | ICD-10-CM | POA: Diagnosis not present

## 2017-10-27 ENCOUNTER — Ambulatory Visit
Admission: RE | Admit: 2017-10-27 | Discharge: 2017-10-27 | Disposition: A | Discharge status: Home / Self Care | Payer: Medicare Other | Source: Ambulatory Visit | Attending: Radiation Oncology | Admitting: Radiation Oncology

## 2017-10-27 DIAGNOSIS — Z510 Encounter for antineoplastic radiation therapy: Secondary | ICD-10-CM | POA: Diagnosis not present

## 2017-10-27 DIAGNOSIS — C541 Malignant neoplasm of endometrium: Secondary | ICD-10-CM | POA: Diagnosis not present

## 2017-10-28 ENCOUNTER — Ambulatory Visit
Admission: RE | Admit: 2017-10-28 | Discharge: 2017-10-28 | Disposition: A | Discharge status: Home / Self Care | Payer: Medicare Other | Source: Ambulatory Visit | Attending: Radiation Oncology | Admitting: Radiation Oncology

## 2017-10-28 DIAGNOSIS — Z510 Encounter for antineoplastic radiation therapy: Secondary | ICD-10-CM | POA: Diagnosis not present

## 2017-10-28 DIAGNOSIS — C541 Malignant neoplasm of endometrium: Secondary | ICD-10-CM | POA: Diagnosis not present

## 2017-10-29 ENCOUNTER — Ambulatory Visit
Admission: RE | Admit: 2017-10-29 | Discharge: 2017-10-29 | Disposition: A | Discharge status: Home / Self Care | Payer: Medicare Other | Source: Ambulatory Visit | Attending: Radiation Oncology | Admitting: Radiation Oncology

## 2017-10-29 DIAGNOSIS — C541 Malignant neoplasm of endometrium: Secondary | ICD-10-CM | POA: Diagnosis not present

## 2017-10-29 DIAGNOSIS — Z510 Encounter for antineoplastic radiation therapy: Secondary | ICD-10-CM | POA: Diagnosis not present

## 2017-11-01 ENCOUNTER — Ambulatory Visit: Payer: Medicare Other

## 2017-11-01 ENCOUNTER — Ambulatory Visit
Admission: RE | Admit: 2017-11-01 | Discharge: 2017-11-01 | Disposition: A | Discharge status: Home / Self Care | Payer: Medicare Other | Source: Ambulatory Visit | Attending: Radiation Oncology | Admitting: Radiation Oncology

## 2017-11-01 DIAGNOSIS — C541 Malignant neoplasm of endometrium: Secondary | ICD-10-CM | POA: Diagnosis not present

## 2017-11-01 DIAGNOSIS — Z510 Encounter for antineoplastic radiation therapy: Secondary | ICD-10-CM | POA: Diagnosis not present

## 2017-11-02 ENCOUNTER — Ambulatory Visit: Payer: Medicare Other

## 2017-11-02 ENCOUNTER — Ambulatory Visit
Admission: RE | Admit: 2017-11-02 | Discharge: 2017-11-02 | Disposition: A | Discharge status: Home / Self Care | Payer: Medicare Other | Source: Ambulatory Visit | Attending: Radiation Oncology | Admitting: Radiation Oncology

## 2017-11-02 DIAGNOSIS — Z510 Encounter for antineoplastic radiation therapy: Secondary | ICD-10-CM | POA: Diagnosis not present

## 2017-11-02 DIAGNOSIS — C541 Malignant neoplasm of endometrium: Secondary | ICD-10-CM | POA: Diagnosis not present

## 2017-11-03 ENCOUNTER — Ambulatory Visit
Admission: RE | Admit: 2017-11-03 | Discharge: 2017-11-03 | Disposition: A | Discharge status: Home / Self Care | Payer: Medicare Other | Source: Ambulatory Visit | Attending: Radiation Oncology | Admitting: Radiation Oncology

## 2017-11-03 ENCOUNTER — Telehealth: Payer: Self-pay | Admitting: *Deleted

## 2017-11-03 DIAGNOSIS — C541 Malignant neoplasm of endometrium: Secondary | ICD-10-CM | POA: Diagnosis not present

## 2017-11-03 DIAGNOSIS — Z510 Encounter for antineoplastic radiation therapy: Secondary | ICD-10-CM | POA: Diagnosis not present

## 2017-11-03 NOTE — Telephone Encounter (Signed)
CALLED PATIENT TO INFORM OF  FU APPT. WITH DR Sondra Come ON 11-15-17 @ 11:30 AM, SPOKE WITH PATIENT AND SHE IS AWARE OF THIS APPT.

## 2017-11-10 ENCOUNTER — Ambulatory Visit: Payer: Medicare Other | Admitting: Radiation Oncology

## 2017-11-15 ENCOUNTER — Other Ambulatory Visit: Payer: Self-pay

## 2017-11-15 ENCOUNTER — Ambulatory Visit: Admission: RE | Admit: 2017-11-15 | Payer: Medicare Other | Source: Ambulatory Visit

## 2017-11-15 ENCOUNTER — Ambulatory Visit
Admission: RE | Admit: 2017-11-15 | Discharge: 2017-11-15 | Disposition: A | Discharge status: Home / Self Care | Payer: Medicare Other | Source: Ambulatory Visit | Attending: Radiation Oncology | Admitting: Radiation Oncology

## 2017-11-15 ENCOUNTER — Encounter: Payer: Self-pay | Admitting: Radiation Oncology

## 2017-11-15 VITALS — BP 123/70 | HR 73 | Temp 97.8°F | Resp 20 | Wt 128.4 lb

## 2017-11-15 DIAGNOSIS — R351 Nocturia: Secondary | ICD-10-CM | POA: Insufficient documentation

## 2017-11-15 DIAGNOSIS — Z91013 Allergy to seafood: Secondary | ICD-10-CM | POA: Diagnosis not present

## 2017-11-15 DIAGNOSIS — R102 Pelvic and perineal pain: Secondary | ICD-10-CM | POA: Insufficient documentation

## 2017-11-15 DIAGNOSIS — Z882 Allergy status to sulfonamides status: Secondary | ICD-10-CM | POA: Insufficient documentation

## 2017-11-15 DIAGNOSIS — Z923 Personal history of irradiation: Secondary | ICD-10-CM | POA: Diagnosis not present

## 2017-11-15 DIAGNOSIS — R300 Dysuria: Secondary | ICD-10-CM | POA: Diagnosis not present

## 2017-11-15 DIAGNOSIS — Z79899 Other long term (current) drug therapy: Secondary | ICD-10-CM | POA: Diagnosis not present

## 2017-11-15 DIAGNOSIS — Z881 Allergy status to other antibiotic agents status: Secondary | ICD-10-CM | POA: Diagnosis not present

## 2017-11-15 DIAGNOSIS — R32 Unspecified urinary incontinence: Secondary | ICD-10-CM | POA: Insufficient documentation

## 2017-11-15 DIAGNOSIS — R3989 Other symptoms and signs involving the genitourinary system: Secondary | ICD-10-CM | POA: Diagnosis not present

## 2017-11-15 DIAGNOSIS — Z08 Encounter for follow-up examination after completed treatment for malignant neoplasm: Secondary | ICD-10-CM | POA: Diagnosis not present

## 2017-11-15 DIAGNOSIS — Y842 Radiological procedure and radiotherapy as the cause of abnormal reaction of the patient, or of later complication, without mention of misadventure at the time of the procedure: Secondary | ICD-10-CM | POA: Insufficient documentation

## 2017-11-15 DIAGNOSIS — C541 Malignant neoplasm of endometrium: Secondary | ICD-10-CM | POA: Insufficient documentation

## 2017-11-15 LAB — URINALYSIS, ROUTINE W REFLEX MICROSCOPIC
BILIRUBIN URINE: NEGATIVE
Glucose, UA: NEGATIVE mg/dL
Hgb urine dipstick: NEGATIVE
Ketones, ur: 20 mg/dL — AB
Nitrite: NEGATIVE
PROTEIN: NEGATIVE mg/dL
SPECIFIC GRAVITY, URINE: 1.018 (ref 1.005–1.030)
pH: 5 (ref 5.0–8.0)

## 2017-11-15 MED ORDER — PHENAZOPYRIDINE HCL 200 MG PO TABS: 200.0000 mg | ORAL_TABLET | Freq: Three times a day (TID) | ORAL | 1 refills | Status: DC | PRN

## 2017-11-15 NOTE — Progress Notes (Signed)
Radiation Oncology         [phone] ________________________________  Name: Tanya Martinez MRN: [id number]  Date: 11/15/2017  DOB: 1923-05-13  Follow-Up Visit Note  CC: Alroy Dust, L.Marlou Sa, MD  Everitt Amber, MD    ICD-10-CM   1. Endometrial cancer, grade I ([place]) C54.1     Diagnosis: 84 y.o.year old with recurrent endometrial carcinoma (grade 1) at the mid /distal vagina and bladder region  Interval Since Last Radiation: 2 weeks 09/29/17-11/03/17: 45 Gy to the pelvis in 25 fractions  Narrative:  The patient returns today for routine follow-up and re-evaluation for possible brachytherapy treatment. She reports a burning pain to the vaginal area along with skin break down. She notes this makes it painful to sit down. She reports mild relief with sitz baths. She notes dysuria that is improving with the use of cream. She notes urinary leakage in the past several days and reports nocturia every hour. She denies blood, cloudiness, or odor to her urine. She denies nausea, vomiting, vaginal or rectal bleeding, vaginal discharge, constipation or diarrhea.                ALLERGIES:  is allergic to azithromycin; shellfish allergy; and sulfa antibiotics.  Meds: Current Outpatient Medications  Medication Sig Dispense Refill  . albuterol (PROVENTIL HFA;VENTOLIN HFA) 108 (90 Base) MCG/ACT inhaler Inhale 1-2 puffs into the lungs every 4 (four) hours as needed for wheezing or shortness of breath.    . Calcium Carbonate-Vit D-Min (CALTRATE 600+D PLUS MINERALS) 600-800 MG-UNIT TABS Take 1 tablet by mouth daily.     Marland Kitchen estradiol (ESTRACE VAGINAL) 0.1 MG/GM vaginal cream Place 1 Applicatorful vaginally at bedtime. Place for 2 weeks then twice a week thereafter 42.5 g 12  . latanoprost (XALATAN) 0.005 % ophthalmic solution Place 1 drop into the right eye at bedtime.    Marland Kitchen levothyroxine (SYNTHROID, LEVOTHROID) 50 MCG tablet Take 50 mcg by mouth daily before breakfast.     . omeprazole (PRILOSEC OTC) 20 MG  tablet Take 20 mg by mouth daily.    Marland Kitchen senna-docusate (SENOKOT-S) 8.6-50 MG tablet Take 1 tablet by mouth 2 (two) times daily. While taking strong pain meds to prevent constipation (Patient not taking: Reported on 09/15/2017) 20 tablet 0   No current facility-administered medications for this encounter.     Physical Findings: The patient is in no acute distress. Patient is alert and oriented.  weight is 128 lb 6 oz (58.2 kg). Her oral temperature is 97.8 F (36.6 C). Her blood pressure is 123/70 and her pulse is 73. Her respiration is 20 and oxygen saturation is 99%.   No significant changes. Lungs are clear to auscultation bilaterally. Heart has regular rate and rhythm. No palpable cervical, supraclavicular, or axillary adenopathy. Abdomen soft, non-tender, normal bowel sounds.   Just the external examination was performed today. The patient continues to have a lot of inflammation along the external genitalia. There are no signs of infection. The patient would not permit a vaginal exam today.    Lab Findings: Lab Results  Component Value Date   WBC 5.6 09/15/2017   HGB 14.4 08/25/2017   HCT 41.5 09/15/2017   MCV 98.0 09/15/2017   PLT 178 09/15/2017    Radiographic Findings: No results found.  Impression:  The patient is recovering from the effects of radiation.  Plan: Follow-up in radiation oncology in 2 weeks. Ordered a urinalysis culture and sensitivity. I advised the patient to continue use of the sitz baths. She is too  uncomfortable to allow vaginal examination and to make a decision concerning vaginal brachytherapy at this time. The patient will also be placed on Pyridium for her dysuria.  ____________________________________  This document serves as a record of services personally performed by Gery Pray, MD. It was created on his behalf by Bethann Humble, a trained medical scribe. The creation of this record is based on the scribe's personal observations and the provider's  statements to them. This document has been checked and approved by the attending provider.

## 2017-11-15 NOTE — Progress Notes (Signed)
Ms. Schexnayder is here today for her follow-up appoint. States that she has pain in her vaginal area. States that she has skin break down and that it burns. States that she has some burning with urination but, that its better since she started using her cream. Denies any nausea or vomiting. Denies any vaginal or rectal bleeding. .Denies any vaginal discharge. Denies any constipation or diarrhea. States that she is having urinary leakage. States that she is using the restroom every hour at night. Wt Readings from Last 3 Encounters:  11/15/17 128 lb 6 oz (58.2 kg)  09/20/17 134 lb 3.2 oz (60.9 kg)  09/15/17 134 lb (60.8 kg)   Vitals:   11/15/17 1124  BP: 123/70  Pulse: 73  Resp: 20  Temp: 97.8 F (36.6 C)  TempSrc: Oral  SpO2: 99%  Weight: 128 lb 6 oz (58.2 kg)

## 2017-11-16 ENCOUNTER — Telehealth: Payer: Self-pay | Admitting: Oncology

## 2017-11-16 LAB — URINE CULTURE: Culture: <10000 — AB

## 2017-11-16 NOTE — Telephone Encounter (Addendum)
Patient called and said she starting taking pyridium and ibuprofen yesterday about 6:30 pm and noticed that she had blood in her urine at bedtime so she stopped taking both medications.  Advised her that pyridium will turn your urine an orange color which can look like blood.  Advised her that it is OK to keep taking both medications.  She verbalized understanding and agreement.

## 2017-11-17 ENCOUNTER — Ambulatory Visit: Payer: Medicare Other | Admitting: Radiation Oncology

## 2017-11-22 ENCOUNTER — Encounter: Payer: Self-pay | Admitting: Oncology

## 2017-11-22 ENCOUNTER — Telehealth: Payer: Self-pay | Admitting: Oncology

## 2017-11-22 ENCOUNTER — Other Ambulatory Visit: Payer: Self-pay

## 2017-11-22 ENCOUNTER — Ambulatory Visit
Admission: RE | Admit: 2017-11-22 | Discharge: 2017-11-22 | Disposition: A | Discharge status: Home / Self Care | Payer: Medicare Other | Source: Ambulatory Visit | Attending: Radiation Oncology | Admitting: Radiation Oncology

## 2017-11-22 VITALS — BP 99/57 | HR 83 | Temp 97.7°F | Ht 64.0 in | Wt 129.6 lb

## 2017-11-22 DIAGNOSIS — C679 Malignant neoplasm of bladder, unspecified: Secondary | ICD-10-CM

## 2017-11-22 DIAGNOSIS — C7982 Secondary malignant neoplasm of genital organs: Secondary | ICD-10-CM

## 2017-11-22 DIAGNOSIS — C541 Malignant neoplasm of endometrium: Secondary | ICD-10-CM

## 2017-11-22 NOTE — Progress Notes (Addendum)
Tanya Martinez is here for follow up.  She reports having skin irritation in her vaginal and rectal area.  She said her skin feels bumpy like blisters.  She has noticed in the past 5 days that the irritation is now spreading to her buttocks.  She also has a clear, sticky discharge that is making her skin stick together. She is also is going through a panty liner an hour.  She reports having burning with urination on the skin outside of her urethra.  She said she is using estrace cream and is wondering if she is having an allergic reaction.  She reports feeling fatigued due to pain and urinating every hour during the night.  She mentioned she is taking Ibuprofen 400 mg in the morning and then 200 mg q 4 hours after which is not helping.  BP (!) 99/57 (Patient Position: Sitting)   Pulse 83   Temp 97.7 F (36.5 C) (Oral)   Ht 5\' 4"  (1.626 m)   Wt 129 lb 9.6 oz (58.8 kg)   SpO2 98%   BMI 22.25 kg/m    Wt Readings from Last 3 Encounters:  11/22/17 129 lb 9.6 oz (58.8 kg)  11/15/17 128 lb 6 oz (58.2 kg)  09/20/17 134 lb 3.2 oz (60.9 kg)

## 2017-11-22 NOTE — Telephone Encounter (Addendum)
Patient left a message saying that her skin is "blistered" on her bottom and seems to be spreading to her buttocks.  She denies having a fever but has noticed a clear vaginal discharge.  She is wondering what she can use on this area.  Called her back and advised her to try using neosporin plus pain twice a day and also that Dr. Sondra Come will be notified.   Discussed with Dr. Sondra Come and patient was advised of 2:30 pm follow up appointment today to check her skin.  She verbalized agreement.

## 2017-11-22 NOTE — Progress Notes (Signed)
Radiation Oncology         [phone] ________________________________  Name: Tanya Martinez MRN: [id number]  Date: 11/22/2017  DOB: Dec 10, 1922  Follow-Up Visit Note  CC: Alroy Dust, L.Marlou Sa, MD  Everitt Amber, MD  No diagnosis found.  Diagnosis: 84 y.o.year old with recurrent endometrial carcinoma (grade 1) at the mid /distal vagina and bladder region  Interval Since Last Radiation: 3 weeks 09/29/17-11/03/17: 45 Gy to the pelvis in 25 fractions  Narrative:  The patient returns today for routine follow-up. Urinalysis shows no evidence of a bladder infection. She reports having skin irritation to there vaginal and rectal area. She reports her skin feels bumpy, like blisters. She has noticed the irritation is now spreading to her buttocks in the past 5 days. She has a clear, sticky discharge that is making her skin stick together. She changes her panty liner every hour. She reports burning with urination on the skin outside of her urethra. She is using estrace cream and is concerned of a possible reaction. She reports feeling fatigued due to pain and urinating every hour during the night. She is taking Ibuprofen 400 mg in the morning and then 200 mg every 4 hours without relief.             ALLERGIES:  is allergic to azithromycin; shellfish allergy; and sulfa antibiotics.  Meds: Current Outpatient Medications  Medication Sig Dispense Refill  . albuterol (PROVENTIL HFA;VENTOLIN HFA) 108 (90 Base) MCG/ACT inhaler Inhale 1-2 puffs into the lungs every 4 (four) hours as needed for wheezing or shortness of breath.    . Calcium Carbonate-Vit D-Min (CALTRATE 600+D PLUS MINERALS) 600-800 MG-UNIT TABS Take 1 tablet by mouth daily.     Marland Kitchen estradiol (ESTRACE VAGINAL) 0.1 MG/GM vaginal cream Place 1 Applicatorful vaginally at bedtime. Place for 2 weeks then twice a week thereafter 42.5 g 12  . ibuprofen (ADVIL,MOTRIN) 200 MG tablet Take 200 mg by mouth every 6 (six) hours as needed.    . latanoprost  (XALATAN) 0.005 % ophthalmic solution Place 1 drop into the right eye at bedtime.    Marland Kitchen levothyroxine (SYNTHROID, LEVOTHROID) 50 MCG tablet Take 50 mcg by mouth daily before breakfast.     . omeprazole (PRILOSEC OTC) 20 MG tablet Take 20 mg by mouth daily.    . phenazopyridine (PYRIDIUM) 200 MG tablet Take 1 tablet (200 mg total) by mouth 3 (three) times daily as needed for pain. (Patient not taking: Reported on 11/22/2017) 30 tablet 1  . senna-docusate (SENOKOT-S) 8.6-50 MG tablet Take 1 tablet by mouth 2 (two) times daily. While taking strong pain meds to prevent constipation (Patient not taking: Reported on 11/22/2017) 20 tablet 0   No current facility-administered medications for this encounter.     Physical Findings: The patient is in no acute distress. Patient is alert and oriented.  height is 5\' 4"  (1.626 m) and weight is 129 lb 9.6 oz (58.8 kg). Her oral temperature is 97.7 F (36.5 C). Her blood pressure is 99/57 (abnormal) and her pulse is 83. Her oxygen saturation is 98%.   No significant changes. Lungs are clear to auscultation bilaterally. Heart has regular rate and rhythm. No palpable cervical, supraclavicular, or axillary adenopathy. Abdomen soft, non-tender, normal bowel sounds.   Just the external examination was performed today. Erythematous maculopapular rash involving the groin, perineum, and peributtocks area.  The patient would not permit a vaginal exam today in light of her discomfort.   Lab Findings: Lab Results  Component Value Date  WBC 5.6 09/15/2017   HGB 14.4 08/25/2017   HCT 41.5 09/15/2017   MCV 98.0 09/15/2017   PLT 178 09/15/2017    Radiographic Findings: No results found.  Impression:  84 y.o.year old with recurrent endometrial carcinoma (grade 1) at the mid /distal vagina and bladder region. The patient's symptoms have worsened over the past week despite previous interventions. The patient has placed the Esterase cream intervaginally and she placed along  the skin of peributtocks area. I am concerned the patient may have allergy to this medication. She will stop the use immediately and limit sitz baths to twice per day. She will try to keep these areas as dry as possible. She will use Desitin ointment if the burning is uncontrolled.  Plan: Follow-up in radiation oncology in 1 week.  ____________________________________  This document serves as a record of services personally performed by Gery Pray, MD. It was created on his behalf by Bethann Humble, a trained medical scribe. The creation of this record is based on the scribe's personal observations and the provider's statements to them. This document has been checked and approved by the attending provider.

## 2017-11-29 ENCOUNTER — Encounter: Payer: Self-pay | Admitting: Radiation Oncology

## 2017-11-29 ENCOUNTER — Other Ambulatory Visit: Payer: Self-pay

## 2017-11-29 ENCOUNTER — Ambulatory Visit
Admission: RE | Admit: 2017-11-29 | Discharge: 2017-11-29 | Disposition: A | Discharge status: Home / Self Care | Payer: Medicare Other | Source: Ambulatory Visit | Attending: Radiation Oncology | Admitting: Radiation Oncology

## 2017-11-29 VITALS — BP 116/62 | HR 81 | Temp 97.8°F | Resp 18 | Wt 132.2 lb

## 2017-11-29 DIAGNOSIS — M7989 Other specified soft tissue disorders: Secondary | ICD-10-CM | POA: Diagnosis not present

## 2017-11-29 DIAGNOSIS — Z923 Personal history of irradiation: Secondary | ICD-10-CM | POA: Insufficient documentation

## 2017-11-29 DIAGNOSIS — K5900 Constipation, unspecified: Secondary | ICD-10-CM | POA: Insufficient documentation

## 2017-11-29 DIAGNOSIS — R300 Dysuria: Secondary | ICD-10-CM | POA: Diagnosis not present

## 2017-11-29 DIAGNOSIS — R21 Rash and other nonspecific skin eruption: Secondary | ICD-10-CM | POA: Diagnosis not present

## 2017-11-29 DIAGNOSIS — Z79899 Other long term (current) drug therapy: Secondary | ICD-10-CM | POA: Diagnosis not present

## 2017-11-29 DIAGNOSIS — R3915 Urgency of urination: Secondary | ICD-10-CM | POA: Diagnosis not present

## 2017-11-29 DIAGNOSIS — C541 Malignant neoplasm of endometrium: Secondary | ICD-10-CM | POA: Diagnosis not present

## 2017-11-29 DIAGNOSIS — C7982 Secondary malignant neoplasm of genital organs: Secondary | ICD-10-CM

## 2017-11-29 DIAGNOSIS — N898 Other specified noninflammatory disorders of vagina: Secondary | ICD-10-CM | POA: Insufficient documentation

## 2017-11-29 LAB — CBC WITH DIFFERENTIAL (CANCER CENTER ONLY)
BASOS PCT: 1 %
Basophils Absolute: 0 10*3/uL (ref 0.0–0.1)
EOS ABS: 0.2 10*3/uL (ref 0.0–0.5)
EOS PCT: 3 %
HCT: 39.4 % (ref 34.8–46.6)
HEMOGLOBIN: 13.1 g/dL (ref 11.6–15.9)
Lymphocytes Relative: 15 %
Lymphs Abs: 0.9 10*3/uL (ref 0.9–3.3)
MCH: 33.2 pg (ref 25.1–34.0)
MCHC: 33.2 g/dL (ref 31.5–36.0)
MCV: 99.7 fL (ref 79.5–101.0)
MONOS PCT: 6 %
Monocytes Absolute: 0.4 10*3/uL (ref 0.1–0.9)
NEUTROS PCT: 75 %
Neutro Abs: 4.5 10*3/uL (ref 1.5–6.5)
PLATELETS: 174 10*3/uL (ref 145–400)
RBC: 3.95 MIL/uL (ref 3.70–5.45)
RDW: 13.9 % (ref 11.2–14.5)
WBC Count: 6 10*3/uL (ref 3.9–10.3)

## 2017-11-29 LAB — BASIC METABOLIC PANEL - CANCER CENTER ONLY
Anion gap: 9 (ref 3–11)
BUN: 16 mg/dL (ref 7–26)
CALCIUM: 9.2 mg/dL (ref 8.4–10.4)
CO2: 26 mmol/L (ref 22–29)
CREATININE: 0.74 mg/dL (ref 0.60–1.10)
Chloride: 107 mmol/L (ref 98–109)
GFR, Estimated: >60 mL/min (ref >60)
Glucose, Bld: 100 mg/dL (ref 70–140)
Potassium: 4.3 mmol/L (ref 3.5–5.1)
Sodium: 142 mmol/L (ref 136–145)

## 2017-11-29 MED ORDER — FUROSEMIDE 20 MG PO TABS: 20.0000 mg | ORAL_TABLET | Freq: Every day | ORAL | 0 refills | Status: DC

## 2017-11-29 MED ORDER — NYSTATIN-TRIAMCINOLONE 100000-0.1 UNIT/GM-% EX OINT: 1.0000 "application " | TOPICAL_OINTMENT | Freq: Two times a day (BID) | CUTANEOUS | 0 refills | Status: DC

## 2017-11-29 NOTE — Progress Notes (Signed)
Radiation Oncology         [phone] ________________________________  Name: Tanya Martinez MRN: [id number]  Date: 11/29/2017  DOB: 07/01/1923  Follow-Up Visit Note  CC: Alroy Dust, L.Marlou Sa, MD  Everitt Amber, MD    ICD-10-CM   1. Endometrial cancer, grade I ([place]) C54.1     Diagnosis: 84 y.o.year old with recurrent endometrial carcinoma (grade 1) at the mid /distal vagina and bladder region  Interval Since Last Radiation: 1 month 09/29/17-11/03/17: 45 Gy to the pelvis in 25 fractions  Narrative:  The patient returns today for close follow-up. The patient reports continued 10/10 pain to the buttocks, and groin from skin irritations. The patient continues to take Ibuprofen for relief. She reports a clear, sticky discharge. She notes she is unsure where the discharge is coming from. She reports changing her liner 2-3 times per hour. She notes dysuria, leakage, and urgency. She also notes a 4 lb weight gain in the past week with bilateral lower extremity edema. She denies any diet changes. She also notes mild fatigue. She reports occasional constipation that she manages with prune juice. She denies nausea/vomiting. She has discontinued use of esterase cream as recommended last week. She did not begin using Desitin. She has been limiting Sitz baths to twice per day          ALLERGIES:  is allergic to azithromycin; shellfish allergy; and sulfa antibiotics.  Meds: Current Outpatient Medications  Medication Sig Dispense Refill  . albuterol (PROVENTIL HFA;VENTOLIN HFA) 108 (90 Base) MCG/ACT inhaler Inhale 1-2 puffs into the lungs every 4 (four) hours as needed for wheezing or shortness of breath.    . Calcium Carbonate-Vit D-Min (CALTRATE 600+D PLUS MINERALS) 600-800 MG-UNIT TABS Take 1 tablet by mouth daily.     Marland Kitchen estradiol (ESTRACE VAGINAL) 0.1 MG/GM vaginal cream Place 1 Applicatorful vaginally at bedtime. Place for 2 weeks then twice a week thereafter (Patient not taking: Reported on 11/29/2017)  42.5 g 12  . ibuprofen (ADVIL,MOTRIN) 200 MG tablet Take 200 mg by mouth every 6 (six) hours as needed.    . latanoprost (XALATAN) 0.005 % ophthalmic solution Place 1 drop into the right eye at bedtime.    Marland Kitchen levothyroxine (SYNTHROID, LEVOTHROID) 50 MCG tablet Take 50 mcg by mouth daily before breakfast.     . omeprazole (PRILOSEC OTC) 20 MG tablet Take 20 mg by mouth daily.    . phenazopyridine (PYRIDIUM) 200 MG tablet Take 1 tablet (200 mg total) by mouth 3 (three) times daily as needed for pain. (Patient not taking: Reported on 11/22/2017) 30 tablet 1  . senna-docusate (SENOKOT-S) 8.6-50 MG tablet Take 1 tablet by mouth 2 (two) times daily. While taking strong pain meds to prevent constipation (Patient not taking: Reported on 11/22/2017) 20 tablet 0   No current facility-administered medications for this encounter.     Physical Findings: The patient is in no acute distress. Patient is alert and oriented.  weight is 132 lb 4 oz (60 kg). Her oral temperature is 97.8 F (36.6 C). Her blood pressure is 116/62 and her pulse is 81. Her respiration is 18 and oxygen saturation is 99%.   No significant changes. On physical exam the patient has bibasilar crackles. The patient has significant edema in her ankle and foot areas.  Heart has regular rate and rhythm. No palpable cervical, supraclavicular, or axillary adenopathy. Abdomen soft, non-tender, normal bowel sounds.  Just the external examination was performed today. Erythematous maculopapular rash involving the groin, perineum, and peributtocks area.  The rash along the front appears to have worsened and possibly spread out further. The peributtocks reaction appears less inflamed today. The area does not seem to be as moist. The patient would not permit a vaginal exam today in light of her discomfort.   Lab Findings: Lab Results  Component Value Date   WBC 5.6 09/15/2017   HGB 14.4 08/25/2017   HCT 41.5 09/15/2017   MCV 98.0 09/15/2017   PLT 178  09/15/2017    Radiographic Findings: No results found.  Impression:  84 y.o.year old with recurrent endometrial carcinoma (grade 1) at the mid /distal vagina and bladder region. The patient's symptoms have worsened over the past week despite previous interventions. The patient has had no improvement in her skin reaction. I am concerned she has a yeast infection at this time and she has been presecribed Nystatin and she will place this along the perineum and [place] along the peributtocks region. She has discontinued use of esterase cream. She did not begin using Desitin. She has been limiting Sitz baths to twice per day. Over the past 24 hours, the patient has developed swelling in her lower extremities. She denies changes in breathing, significant cough, or chest pain.   I advised the patient to elevate her legs while sitting or laying down due to dependent edema.  Plan: Follow-up in radiation oncology in 3 days. She will be given a limited prescription of Lasix for fluid retention. blood work today CBC with differential and BMet.  ____________________________________  This document serves as a record of services personally performed by Gery Pray, MD. It was created on his behalf by Bethann Humble, a trained medical scribe. The creation of this record is based on the scribe's personal observations and the provider's statements to them. This document has been checked and approved by the attending provider.

## 2017-11-29 NOTE — Progress Notes (Addendum)
Patient is here for a follow-up appointment. Patient states that she is having pain in her buttocks area, and groins from skin irritations. Rates her pain a 10/10. Patient is currently taking Ibuprofen for relief. Patient states that she has  A clear discharge that is sticky. States that she is unsure where the discharge is coming from. States that she is using 2-3 liner per hour. States that she has some dysuria.States that she is having some leakage. States that she has some urgency. States that she has mild fatigue. Denies any nausea or vomiting. States that she has some constipation ,but she drunk prune juice and got some relief. Patient has edema in both feet.Today is the first time she noticed it. Vitals:   11/29/17 1039  BP: 116/62  Pulse: 81  Resp: 18  Temp: 97.8 F (36.6 C)  TempSrc: Oral  SpO2: 99%  Weight: 132 lb 4 oz (60 kg)   Wt Readings from Last 3 Encounters:  11/29/17 132 lb 4 oz (60 kg)  11/22/17 129 lb 9.6 oz (58.8 kg)  11/15/17 128 lb 6 oz (58.2 kg)

## 2017-12-02 ENCOUNTER — Encounter: Payer: Self-pay | Admitting: Radiation Oncology

## 2017-12-02 ENCOUNTER — Other Ambulatory Visit: Payer: Self-pay

## 2017-12-02 ENCOUNTER — Ambulatory Visit
Admission: RE | Admit: 2017-12-02 | Discharge: 2017-12-02 | Disposition: A | Discharge status: Home / Self Care | Payer: Medicare Other | Source: Ambulatory Visit | Attending: Radiation Oncology | Admitting: Radiation Oncology

## 2017-12-02 VITALS — BP 125/67 | HR 79 | Temp 98.0°F | Resp 18 | Wt 127.0 lb

## 2017-12-02 DIAGNOSIS — C7982 Secondary malignant neoplasm of genital organs: Secondary | ICD-10-CM | POA: Insufficient documentation

## 2017-12-02 DIAGNOSIS — Z882 Allergy status to sulfonamides status: Secondary | ICD-10-CM | POA: Insufficient documentation

## 2017-12-02 DIAGNOSIS — C541 Malignant neoplasm of endometrium: Secondary | ICD-10-CM | POA: Diagnosis not present

## 2017-12-02 DIAGNOSIS — B372 Candidiasis of skin and nail: Secondary | ICD-10-CM | POA: Insufficient documentation

## 2017-12-02 DIAGNOSIS — Z79899 Other long term (current) drug therapy: Secondary | ICD-10-CM | POA: Diagnosis not present

## 2017-12-02 NOTE — Progress Notes (Addendum)
Ms. pricila bridge for  a follow-up appointment.Patient states that she is having pain in her buttock area. She also has an area on her vagina  that is tender. States that she has moderate fatigue. Denies any dysuria. Denies any vaginal or rectal bleeding. Denies any discharge. Denies any nausea or vomiting. States that  appetite is not good. She is rating her pain 2/10. Vitals:   12/02/17 1616  BP: 125/67  Pulse: 79  Resp: 18  Temp: 98 F (36.7 C)  TempSrc: Oral  SpO2: 97%  Weight: 127 lb (57.6 kg)   Wt Readings from Last 3 Encounters:  12/02/17 127 lb (57.6 kg)  11/29/17 132 lb 4 oz (60 kg)  11/22/17 129 lb 9.6 oz (58.8 kg)

## 2017-12-02 NOTE — Progress Notes (Signed)
Radiation Oncology         [phone] ________________________________  Name: Tanya Martinez MRN: [id number]  Date: 12/02/2017  DOB: [birth year]  Follow-Up Visit Note  CC: Alroy Dust, L.Marlou Sa, MD  Everitt Amber, MD    ICD-10-CM   1. Endometrial cancer, grade I ([place]) C54.1   2. 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 Recurrent carcinoma of endometrium ([place]) C54.1     Diagnosis: 84 y.o.year old with recurrent endometrial carcinoma (grade 1) at the mid /distal vagina and bladder region  Interval Since Last Radiation: 1 month 09/29/17-11/03/17: 45 Gy to the pelvis in 25 fractions  Narrative:  The patient returns today for close follow-up. Patient was seen earlier this week with a brisk erythematous reaction along the groin and perineum. Patient was felt to possibly have a yeast infection and was placed on nystatin/triamcinolone ointment. Patient was also recommended to try Desitin. Patient has noticed significant improvement in her discomfort in this area and her clear drainage from her skin and vaginal area has essentially cleared up. she is much more comfortable today. She rates her pain at a level of 2 out of 10. Patient has had significant improvement in her limited course of Lasix with less swelling in her ankles and feet area. She denies any cough or breathing problems or blood in her urine or rectal bleeding.  She has lost approximately 4-5 pounds is back to her normal weight     ALLERGIES:  is allergic to azithromycin; shellfish allergy; and sulfa antibiotics.  Meds: Current Outpatient Medications  Medication Sig Dispense Refill  . albuterol (PROVENTIL HFA;VENTOLIN HFA) 108 (90 Base) MCG/ACT inhaler Inhale 1-2 puffs into the lungs every 4 (four) hours as needed for wheezing or shortness of breath.    . Calcium Carbonate-Vit D-Min (CALTRATE 600+D PLUS MINERALS) 600-800 MG-UNIT TABS Take 1 tablet by mouth daily.     . furosemide (LASIX) 20 MG tablet Take 1 tablet (20  mg total) by mouth daily. 3 tablet 0  . ibuprofen (ADVIL,MOTRIN) 200 MG tablet Take 200 mg by mouth every 6 (six) hours as needed.    . latanoprost (XALATAN) 0.005 % ophthalmic solution Place 1 drop into the right eye at bedtime.    Marland Kitchen levothyroxine (SYNTHROID, LEVOTHROID) 50 MCG tablet Take 50 mcg by mouth daily before breakfast.     . nystatin-triamcinolone ointment (MYCOLOG) Apply 1 application topically 2 (two) times daily. 30 g 0  . omeprazole (PRILOSEC OTC) 20 MG tablet Take 20 mg by mouth daily.    Marland Kitchen estradiol (ESTRACE VAGINAL) 0.1 MG/GM vaginal cream Place 1 Applicatorful vaginally at bedtime. Place for 2 weeks then twice a week thereafter (Patient not taking: Reported on 11/29/2017) 42.5 g 12  . phenazopyridine (PYRIDIUM) 200 MG tablet Take 1 tablet (200 mg total) by mouth 3 (three) times daily as needed for pain. (Patient not taking: Reported on 11/22/2017) 30 tablet 1  . senna-docusate (SENOKOT-S) 8.6-50 MG tablet Take 1 tablet by mouth 2 (two) times daily. While taking strong pain meds to prevent constipation (Patient not taking: Reported on 11/22/2017) 20 tablet 0   No current facility-administered medications for this encounter.     Physical Findings: The patient is in no acute distress. Patient is alert and oriented.  weight is 127 lb (57.6 kg). Her oral temperature is 98 F (36.7 C). Her blood pressure is 125/67 and her pulse is 79. Her respiration is 18 and oxygen saturation is 97%.      On physical  exam the patient has mild bibasilar crackles. The patient has significantly less edema in her ankle and foot areas.  Heart has regular rate and rhythm. No palpable cervical, supraclavicular, or axillary adenopathy. Abdomen soft, non-tender, normal bowel sounds.  Just the external examination was performed today. Erythematous macular rash involving the groin, perineum, and peributtocks area is much improved .  Lab Findings: Lab Results  Component Value Date   WBC 6.0 11/29/2017   HGB  14.4 08/25/2017   HCT 39.4 11/29/2017   MCV 99.7 11/29/2017   PLT 174 11/29/2017    Radiographic Findings: No results found.  Impression:  84 y.o.year old with recurrent endometrial carcinoma (grade 1) at the mid /distal vagina and bladder region. Patient was apparently suffering from a cutaneous yeast infection at the location of her radiation skin reaction but is much better now.    Plan: patient will return in one week and she feels that time we'll be able to tolerate a vaginal exam to determine if she will proceed with vaginal brachytherapy as part of her treatment.     ____________________________________  This document serves as a record of services personally performed by Gery Pray, MD. It was created on his behalf by Steva Colder, a trained medical scribe. The creation of this record is based on the scribe's personal observations and the provider's statements to them. This document has been checked and approved by the attending provider.

## 2017-12-09 ENCOUNTER — Encounter: Payer: Self-pay | Admitting: Radiation Oncology

## 2017-12-09 ENCOUNTER — Telehealth: Payer: Self-pay | Admitting: Oncology

## 2017-12-09 ENCOUNTER — Other Ambulatory Visit: Payer: Self-pay

## 2017-12-09 ENCOUNTER — Ambulatory Visit
Admission: RE | Admit: 2017-12-09 | Discharge: 2017-12-09 | Disposition: A | Discharge status: Home / Self Care | Payer: Medicare Other | Source: Ambulatory Visit | Attending: Radiation Oncology | Admitting: Radiation Oncology

## 2017-12-09 VITALS — BP 117/96 | HR 69 | Temp 98.9°F | Resp 17 | Wt 127.2 lb

## 2017-12-09 DIAGNOSIS — Z882 Allergy status to sulfonamides status: Secondary | ICD-10-CM | POA: Insufficient documentation

## 2017-12-09 DIAGNOSIS — C674 Malignant neoplasm of posterior wall of bladder: Secondary | ICD-10-CM | POA: Diagnosis not present

## 2017-12-09 DIAGNOSIS — Z881 Allergy status to other antibiotic agents status: Secondary | ICD-10-CM | POA: Diagnosis not present

## 2017-12-09 DIAGNOSIS — C541 Malignant neoplasm of endometrium: Secondary | ICD-10-CM

## 2017-12-09 DIAGNOSIS — Z79899 Other long term (current) drug therapy: Secondary | ICD-10-CM | POA: Insufficient documentation

## 2017-12-09 DIAGNOSIS — R0602 Shortness of breath: Secondary | ICD-10-CM | POA: Insufficient documentation

## 2017-12-09 NOTE — Telephone Encounter (Signed)
Left a message for Tanya Martinez advising that her appointment with Dr. Denman George has been scheduled for 01/05/18 at 3:45 pm.

## 2017-12-09 NOTE — Progress Notes (Signed)
Patient states that she is having moderate fatigue. States that she is tender in her vaginal area. Denies any vaginal discharge.denies any vaginal or rectal bleeding. Denies any nausea or vomiting.States that she was constipated this weekend ,but she drunk some prune juice and got relief.Denies any issues with her skin Vitals:   12/09/17 1516  BP: (!) 117/96  Pulse: 69  Resp: 17  Temp: 98.9 F (37.2 C)  TempSrc: Oral  SpO2: 96%  Weight: 127 lb 4 oz (57.7 kg)   Wt Readings from Last 3 Encounters:  12/09/17 127 lb 4 oz (57.7 kg)  12/02/17 127 lb (57.6 kg)  11/29/17 132 lb 4 oz (60 kg)

## 2017-12-09 NOTE — Progress Notes (Signed)
Radiation Oncology         [phone] ________________________________  Name: Tanya Martinez MRN: [id number]  Date: 12/09/2017  DOB: 1923/08/01  Follow-Up Visit Note  CC: Alroy Dust, L.Marlou Sa, MD  Everitt Amber, MD    ICD-10-CM   1. Malignant neoplasm of posterior wall of urinary bladder ([place]) C67.4   2. Recurrent carcinoma of endometrium ([place]) C54.1     Diagnosis: 84 y.o.year old with recurrent endometrial carcinoma (grade 1) at the mid /distal vagina and bladder region  Interval Since Last Radiation: 1 month 09/29/17-11/03/17: 45 Gy to the pelvis in 25 fractions  Narrative:  The patient returns today for close follow-up. She is doing much better at this time. She continues to have significant sensitivity in the vaginal area but her skin problems of essentially resolved at this time. Her skin reaction responded well to Mycolog cream.  Patient denies any vaginal bleeding hematuria or rectal bleeding. Her energy level is improving   ALLERGIES:  is allergic to azithromycin; shellfish allergy; and sulfa antibiotics.  Meds: Current Outpatient Medications  Medication Sig Dispense Refill  . Calcium Carbonate-Vit D-Min (CALTRATE 600+D PLUS MINERALS) 600-800 MG-UNIT TABS Take 1 tablet by mouth daily.     Marland Kitchen latanoprost (XALATAN) 0.005 % ophthalmic solution Place 1 drop into the right eye at bedtime.    Marland Kitchen levothyroxine (SYNTHROID, LEVOTHROID) 50 MCG tablet Take 50 mcg by mouth daily before breakfast.     . omeprazole (PRILOSEC OTC) 20 MG tablet Take 20 mg by mouth daily.    Marland Kitchen albuterol (PROVENTIL HFA;VENTOLIN HFA) 108 (90 Base) MCG/ACT inhaler Inhale 1-2 puffs into the lungs every 4 (four) hours as needed for wheezing or shortness of breath.    . estradiol (ESTRACE VAGINAL) 0.1 MG/GM vaginal cream Place 1 Applicatorful vaginally at bedtime. Place for 2 weeks then twice a week thereafter (Patient not taking: Reported on 11/29/2017) 42.5 g 12  . furosemide (LASIX) 20 MG tablet Take 1 tablet (20 mg  total) by mouth daily. (Patient not taking: Reported on 12/09/2017) 3 tablet 0  . ibuprofen (ADVIL,MOTRIN) 200 MG tablet Take 200 mg by mouth every 6 (six) hours as needed.    . nystatin-triamcinolone ointment (MYCOLOG) Apply 1 application topically 2 (two) times daily. (Patient not taking: Reported on 12/09/2017) 30 g 0  . phenazopyridine (PYRIDIUM) 200 MG tablet Take 1 tablet (200 mg total) by mouth 3 (three) times daily as needed for pain. (Patient not taking: Reported on 11/22/2017) 30 tablet 1  . senna-docusate (SENOKOT-S) 8.6-50 MG tablet Take 1 tablet by mouth 2 (two) times daily. While taking strong pain meds to prevent constipation (Patient not taking: Reported on 11/22/2017) 20 tablet 0   No current facility-administered medications for this encounter.     Physical Findings: The patient is in no acute distress. Patient is alert and oriented.  weight is 127 lb 4 oz (57.7 kg). Her oral temperature is 98.9 F (37.2 C). Her blood pressure is 117/96 (abnormal) and her pulse is 69. Her respiration is 17 and oxygen saturation is 96%.    Lungs are clear to auscultation bilaterally. Heart has regular rate and rhythm. No palpable cervical, supraclavicular, or axillary adenopathy. Abdomen soft, non-tender, normal bowel sounds. Pt skin is well healed and some continued eythema no blisters or lesions noted.  Vaginal wall is significantly narrowed, exam would only permit a small speculum. No obvious mucosal noted. No obvious palpable vaginal mass although the exam was extremely uncomfortable for the patient and detailed exam could not  be performed. Exam would permit an index finger.   Lab Findings: Lab Results  Component Value Date   WBC 6.0 11/29/2017   HGB 13.1 11/29/2017   HCT 39.4 11/29/2017   MCV 99.7 11/29/2017   PLT 174 11/29/2017    Radiographic Findings: No results found.  Impression:  84 y.o.year old with recurrent endometrial carcinoma (grade 1) at the mid /distal vagina and bladder  region. Her overall skin problems have improved significantly over the past several days the use of Mycolog cream.  Based on exam today,  the patient would not be able to tolerate vaginal brachytherapy. I would recommend the patient follow with Dr. Denman George in approximately a month for detailed vaginal exam. Hopefully the patient will be able to tolerate this exam better after several weeks of recuperation. If patient is found to have persistent disease in the vaginal vault then we will have to reconsider vaginal brachytherapy at that time.   Plan: Pt will be set up to see Dr. Denman George in about a month. Routine follow-up in radiation oncology in 2 months. At some point the patient will likely need repeat cystoscopy given the large recurrence within the bladder;  possibly a pelvic CT scan for follow-up in addition.  -----------------------------------  Blair Promise, PhD, MD  This document serves as a record of services personally performed by Gery Pray, MD. It was created on his behalf by Valeta Harms, a trained medical scribe. The creation of this record is based on the scribe's personal observations and the provider's statements to them. This document has been checked and approved by the attending provider.

## 2017-12-13 ENCOUNTER — Encounter: Payer: Self-pay | Admitting: Radiation Oncology

## 2017-12-13 NOTE — Progress Notes (Signed)
  Radiation Oncology         [phone]) [phone] ________________________________  Name: Tanya Martinez MRN: [id number]  Date: 12/13/2017  DOB: 07/03/1923  End of Treatment Note  Diagnosis:   recurrent endometrial carcinoma (grade 1) at themid vagina area as well as posterior dome of the bladder     Indication for treatment:  Curative       Radiation treatment dates:   09/29/2017 - 11/03/2017  Site/dose:   Pelvis/ 45 Gy delivered in 25 fractions of 1.8 Gy  Beams/energy:   IMRT/ 6X  Narrative: The patient did not tolerate radiation treatment well.   By the end of treatment she endorsed dysuria, pain, fatigue and loose stools. She was unable to complete any planned brachytherapy treatments due to her severe discomfort and extensive skin reaction which eventually responded to treatments with Mycolog cream.  She denied vaginal discharge, vaginal or rectal bleeding, nausea, and vomiting.   Plan: The patient has completed radiation treatment. The patient will return to radiation oncology clinic for routine followup in 2 months. She will meet with Dr. Denman George in approximately 1 month's see if any residual disease is noted in the vaginal vault. The patient may also require a cystoscopy for follow-up of her bladder involvement and may require a pelvic CT scan in the future for follow-up. I advised them to call or return sooner if they have any questions or concerns related to their recovery or treatment.  -----------------------------------  Blair Promise, PhD, MD  This document serves as a record of services personally performed by Blair Promise, PhD, MD. It was created on his behalf by Margit Banda, a trained medical scribe. The creation of this record is based on the scribe's personal observations and the provider's statements to them. This document has been checked and approved by the attending provider.

## 2017-12-27 ENCOUNTER — Telehealth: Payer: Self-pay | Admitting: Oncology

## 2017-12-27 NOTE — Telephone Encounter (Signed)
Patient's daughter called and wanted to confirm Indie's next appointment with Dr. Denman George.  Advised her that the appointment with Dr. Denman George is on 01/05/18 at 3:45.  She verbalized understanding and agreement.

## 2018-01-05 ENCOUNTER — Encounter: Payer: Self-pay | Admitting: Gynecologic Oncology

## 2018-01-05 ENCOUNTER — Inpatient Hospital Stay: Payer: Medicare Other | Attending: Gynecologic Oncology | Admitting: Gynecologic Oncology

## 2018-01-05 VITALS — BP 142/60 | HR 71 | Temp 97.3°F | Resp 18 | Ht 64.0 in | Wt 128.8 lb

## 2018-01-05 DIAGNOSIS — C541 Malignant neoplasm of endometrium: Secondary | ICD-10-CM | POA: Insufficient documentation

## 2018-01-05 DIAGNOSIS — Z90722 Acquired absence of ovaries, bilateral: Secondary | ICD-10-CM | POA: Diagnosis not present

## 2018-01-05 DIAGNOSIS — Z923 Personal history of irradiation: Secondary | ICD-10-CM | POA: Diagnosis not present

## 2018-01-05 DIAGNOSIS — Z90710 Acquired absence of both cervix and uterus: Secondary | ICD-10-CM | POA: Insufficient documentation

## 2018-01-05 NOTE — Progress Notes (Signed)
Follow-up Note: Gyn-Onc  Consult was requested by Dr. Ulanda Edison for the evaluation of Tanya Martinez 84 y.o. female  CC:  Chief Complaint  Patient presents with  . Endometrial cancer, grade I [facility])    Assessment/Plan:  [name]  is a 84 y.o.  year old with history of recurrent endometrial carcinoma (grade 1) at the distal vagina and bladder dome treated with transurethral resection and whole pelvic RT completed 11/03/17.  She has had a complete clinical response to therapy.  I recommend she follow-up with Dr Sondra Come in 3 months and myself in 6 months.  If relapse develops, would consider hormonal therapy.   HPI: Tanya Martinez is a 84 year old P1 who is seen in consultation at the request of Dr Daiva Huge for endometrial cancer recurrence.  The patient has a remote history of a TAH BSO in 1989 with Dr Ulanda Edison for grade 1, stage IA (clinical staging) endometrial cancer.  She was managed expectantly and did well.  In mid October, 2018 she noticed vaginal bleeding and saw Dr Ulanda Edison. On his exam a polypoid outgrowth was noted in the distal third of the left vaginal sidewall. Her vagina is substantially narrowed from atrophy and exam was difficult. The lesion broke off spontaneously with no gross residual remaining. It was sent for pathology which revealed a grade 1 endometrioid adenocarcinoma.   The patient is fairly healthy despite her advanced age. She has pulmonary fibrosis. She has had 1 cesarean section.  Interval Hx.  PET on 07/22/17 showed a 3cm hypermetabolic pelvic mass involving the posterior dome of the urinary bladder and adjacent fat consistent with recurrent endometrial carcinoma.   She underwent a transurethral resection of the bladder mass with Dr Tresa Moore on 08/25/17.  Final pathology revealed adenocarcinoma with squamous differentiation consistent with endometrial primary.   She then went on to complete whole pelvic XRT: Radiation treatment dates:   09/29/2017 -  11/03/2017 Site/dose:   Pelvis/ 45 Gy delivered in 25 fractions of 1.8 Gy  She had substantial GU and GI toxicity with radiation.   Current Meds:  Outpatient Encounter Medications as of 01/05/2018  Medication Sig  . albuterol (PROVENTIL HFA;VENTOLIN HFA) 108 (90 Base) MCG/ACT inhaler Inhale 1-2 puffs into the lungs every 4 (four) hours as needed for wheezing or shortness of breath.  . Calcium Carbonate-Vit D-Min (CALTRATE 600+D PLUS MINERALS) 600-800 MG-UNIT TABS Take 1 tablet by mouth daily.   Marland Kitchen estradiol (ESTRACE VAGINAL) 0.1 MG/GM vaginal cream Place 1 Applicatorful vaginally at bedtime. Place for 2 weeks then twice a week thereafter (Patient not taking: Reported on 11/29/2017)  . furosemide (LASIX) 20 MG tablet Take 1 tablet (20 mg total) by mouth daily. (Patient not taking: Reported on 12/09/2017)  . ibuprofen (ADVIL,MOTRIN) 200 MG tablet Take 200 mg by mouth every 6 (six) hours as needed.  . latanoprost (XALATAN) 0.005 % ophthalmic solution Place 1 drop into the right eye at bedtime.  Marland Kitchen levothyroxine (SYNTHROID, LEVOTHROID) 50 MCG tablet Take 50 mcg by mouth daily before breakfast.   . nystatin-triamcinolone ointment (MYCOLOG) Apply 1 application topically 2 (two) times daily. (Patient not taking: Reported on 12/09/2017)  . omeprazole (PRILOSEC OTC) 20 MG tablet Take 20 mg by mouth daily.  . phenazopyridine (PYRIDIUM) 200 MG tablet Take 1 tablet (200 mg total) by mouth 3 (three) times daily as needed for pain. (Patient not taking: Reported on 11/22/2017)  . senna-docusate (SENOKOT-S) 8.6-50 MG tablet Take 1 tablet by mouth 2 (two) times daily. While taking strong  pain meds to prevent constipation (Patient not taking: Reported on 11/22/2017)   No facility-administered encounter medications on file as of 01/05/2018.     Allergy:  Allergies  Allergen Reactions  . Azithromycin     GI Upset (intolerance)  . Shellfish Allergy Nausea Only  . Sulfa Antibiotics Other (See Comments)    "Lips felt  numb"    Social Hx:   Social History   Socioeconomic History  . Marital status: Widowed    Spouse name: Not on file  . Number of children: 1  . Years of education: Not on file  . Highest education level: Not on file  Occupational History  . Occupation: retired  Scientific laboratory technician  . Financial resource strain: Not on file  . Food insecurity:    Worry: Not on file    Inability: Not on file  . Transportation needs:    Medical: Not on file    Non-medical: Not on file  Tobacco Use  . Smoking status: Never Smoker  . Smokeless tobacco: Never Used  Substance and Sexual Activity  . Alcohol use: Yes    Comment: occasionally  . Drug use: No  . Sexual activity: Not on file  Lifestyle  . Physical activity:    Days per week: 7 days    Minutes per session: 10 min  . Stress: Not on file  Relationships  . Social connections:    Talks on phone: Not on file    Gets together: Not on file    Attends religious service: Not on file    Active member of club or organization: Not on file    Attends meetings of clubs or organizations: Not on file    Relationship status: Not on file  . Intimate partner violence:    Fear of current or ex partner: Not on file    Emotionally abused: Not on file    Physically abused: Not on file    Forced sexual activity: Not on file  Other Topics Concern  . Not on file  Social History Narrative  . Not on file    Past Surgical Hx:  Past Surgical History:  Procedure Laterality Date  . BREAST LUMPECTOMY     2 lumps removed from rt breast  . CESAREAN SECTION    . COLONOSCOPY    . CYSTOSCOPY W/ RETROGRADES Bilateral 08/25/2017   Procedure: CYSTOSCOPY WITH BILATERAL RETROGRADE PYELOGRAM;  Surgeon: Alexis Frock, MD;  Location: [facility];  Service: Urology;  Laterality: Bilateral;  . EYE SURGERY     detached retina  . HEMORRHOID SURGERY    . HYSTERECTOMY ABDOMINAL WITH SALPINGECTOMY  1989  . THYROID SURGERY     growth removed  . TRANSURETHRAL RESECTION OF  BLADDER TUMOR N/A 08/25/2017   Procedure: TRANSURETHRAL RESECTION OF BLADDER TUMOR (TURBT);  Surgeon: Alexis Frock, MD;  Location: [facility];  Service: Urology;  Laterality: N/A;    Past Medical Hx:  Past Medical History:  Diagnosis Date  . Acid reflux   . Colon polyps   . Glaucoma   . History of radiation therapy 09/29/17-11/03/17   45 Gy to the pelvis in 25 fractions  . Hypothyroidism   . Pneumonia   . Uterine cancer [facility])     Past Gynecological History:  C/s x 1, history of stage I endometrial cancer in 1989. No LMP recorded. Patient is postmenopausal.  Family Hx:  Family History  Problem Relation Age of Onset  . Heart attack Mother   . Heart disease Mother   .  Heart attack Brother   . Heart disease Brother   . Heart attack Sister   . Heart disease Sister   . Stroke Father     Review of Systems:  Constitutional  Feels well,    ENT Normal appearing ears and nares bilaterally Skin/Breast  No rash, sores, jaundice, itching, dryness Cardiovascular  No chest pain, shortness of breath, or edema  Pulmonary  No cough or wheeze.  Gastro Intestinal  No nausea, vomitting, or diarrhoea. No bright red blood per rectum, no abdominal pain, change in bowel movement, or constipation.  Genito Urinary  No frequency, urgency, dysuria, no vaginal spotting, + vulvar irritation. Musculo Skeletal  No myalgia, arthralgia, joint swelling or pain  Neurologic  No weakness, numbness, change in gait,  Psychology  No depression, anxiety, insomnia.   Vitals:  Blood pressure (!) 142/60, pulse 71, temperature (!) 97.3 F (36.3 C), temperature source Oral, resp. rate 18, height 5\' 4"  (1.626 m), weight 128 lb 12.8 oz (58.4 kg), SpO2 97 %.  Physical Exam: WD in NAD Neck  Supple NROM, without any enlargements.  Lymph Node Survey No cervical supraclavicular or inguinal adenopathy Cardiovascular  Pulse normal rate, regularity and rhythm. S1 and S2 normal.  Lungs  Clear to auscultation  bilateraly, without wheezes/crackles/rhonchi. Good air movement.  Skin  No rash/lesions/breakdown  Psychiatry  Alert and oriented to person, place, and time  Abdomen  Normoactive bowel sounds, abdomen soft, non-tender and thin without evidence of hernia.  Back No CVA tenderness Genito Urinary  Vulva/vagina: radiation changes and wet desquamation to labia majora. No cellulitis.   Bladder/urethra:  No lesions or masses, well supported bladder  Vagina: very narrow introitus, accomodates narrow speculum and single digit on exam. No visible residual disease on left sidewall.   Cervix and uterus surgically absent  Adnexa: no palpable masses. Rectal  deferred Extremities  No bilateral cyanosis, clubbing or edema.   Thereasa Solo, MD  01/05/2018, 4:06 PM

## 2018-01-05 NOTE — Patient Instructions (Signed)
Try A&D ointment on your bottom and vaginal tissues for relief of radiation burn symptoms.   Please return to see Dr Sondra Come in August (Contact his office in July).  Please return to see Dr Denman George in November, 2019.   Please notify Dr Denman George at phone number [phone] if you notice vaginal bleeding, new pelvic or abdominal pains, bloating, feeling full easy, or a change in bladder or bowel function.

## 2018-01-06 ENCOUNTER — Telehealth: Payer: Self-pay | Admitting: Oncology

## 2018-01-06 ENCOUNTER — Encounter: Payer: Self-pay | Admitting: Oncology

## 2018-01-06 NOTE — Telephone Encounter (Signed)
Called Kim in [facility] Pathology and added ER/PR receptor testing on accession: [id number].

## 2018-02-28 DIAGNOSIS — H52203 Unspecified astigmatism, bilateral: Secondary | ICD-10-CM | POA: Diagnosis not present

## 2018-02-28 DIAGNOSIS — Z961 Presence of intraocular lens: Secondary | ICD-10-CM | POA: Diagnosis not present

## 2018-02-28 DIAGNOSIS — H5203 Hypermetropia, bilateral: Secondary | ICD-10-CM | POA: Diagnosis not present

## 2018-02-28 DIAGNOSIS — H524 Presbyopia: Secondary | ICD-10-CM | POA: Diagnosis not present

## 2018-02-28 DIAGNOSIS — H4051X3 Glaucoma secondary to other eye disorders, right eye, severe stage: Secondary | ICD-10-CM | POA: Diagnosis not present

## 2018-03-07 ENCOUNTER — Encounter: Payer: Self-pay | Admitting: Radiation Oncology

## 2018-03-07 ENCOUNTER — Ambulatory Visit
Admission: RE | Admit: 2018-03-07 | Discharge: 2018-03-07 | Disposition: A | Discharge status: Home / Self Care | Payer: Medicare Other | Source: Ambulatory Visit | Attending: Radiation Oncology | Admitting: Radiation Oncology

## 2018-03-07 ENCOUNTER — Other Ambulatory Visit: Payer: Self-pay

## 2018-03-07 VITALS — BP 125/65 | HR 69 | Temp 98.0°F | Resp 20 | Ht 64.0 in | Wt 124.0 lb

## 2018-03-07 DIAGNOSIS — Z923 Personal history of irradiation: Secondary | ICD-10-CM | POA: Insufficient documentation

## 2018-03-07 DIAGNOSIS — Z8542 Personal history of malignant neoplasm of other parts of uterus: Secondary | ICD-10-CM | POA: Diagnosis not present

## 2018-03-07 DIAGNOSIS — Z79899 Other long term (current) drug therapy: Secondary | ICD-10-CM | POA: Insufficient documentation

## 2018-03-07 DIAGNOSIS — R634 Abnormal weight loss: Secondary | ICD-10-CM | POA: Diagnosis not present

## 2018-03-07 DIAGNOSIS — C541 Malignant neoplasm of endometrium: Secondary | ICD-10-CM | POA: Diagnosis not present

## 2018-03-07 DIAGNOSIS — R0602 Shortness of breath: Secondary | ICD-10-CM | POA: Insufficient documentation

## 2018-03-07 DIAGNOSIS — M7989 Other specified soft tissue disorders: Secondary | ICD-10-CM | POA: Diagnosis not present

## 2018-03-07 DIAGNOSIS — C7982 Secondary malignant neoplasm of genital organs: Secondary | ICD-10-CM

## 2018-03-07 DIAGNOSIS — Z08 Encounter for follow-up examination after completed treatment for malignant neoplasm: Secondary | ICD-10-CM | POA: Diagnosis not present

## 2018-03-07 DIAGNOSIS — Z8551 Personal history of malignant neoplasm of bladder: Secondary | ICD-10-CM | POA: Diagnosis not present

## 2018-03-07 NOTE — Progress Notes (Addendum)
Radiation Oncology         [phone] ________________________________  Name: Tanya Martinez MRN: [id number]  Date: 03/07/2018  DOB: 22-Jan-1923   Follow-Up Visit Note  CC: Alroy Dust, L.Marlou Sa, MD  Everitt Amber, MD    ICD-10-CM   1. Endometrial cancer, grade I (HCC) C54.1     Diagnosis:   Recurrent endometrial carcinoma (grade 1) at themid vaginaarea as well as posterior dome of the bladder     Interval Since Last Radiation:  4 months  Radiation treatment dates:   09/29/2017 - 11/03/2017  Site/dose:   Pelvis/ 45 Gy delivered in 25 fractions of 1.8 Gy, she was unable to proceed with vaginal brachytherapy given her persistent pain in the vaginal region.  Narrative:  The patient returns today for routine follow-up.  she is doing well overall. She is accompanied by her daughter today. She was initially evaluated by Dr. Denman George. She would like to postpone her pelvic exam today due to pain to the radiation site.           On review of systems, she reports weight loss (4 lbs), decreased appetite, lack of taste, SOB, and intermittent leg swelling. Daughter notes that the patient is eating as she normally would for the past couple of days. She notes that she is consuming Boost supplements.  she denies vaginal bleeding, rectal bleeding, hematuria, and any other symptoms. Pertinent positives are listed and detailed within the above HPI.                 ALLERGIES:  is allergic to nystatin-triamcinolone; azithromycin; shellfish allergy; and sulfa antibiotics.  Meds: Current Outpatient Medications  Medication Sig Dispense Refill  . albuterol (PROVENTIL HFA;VENTOLIN HFA) 108 (90 Base) MCG/ACT inhaler Inhale 1-2 puffs into the lungs every 4 (four) hours as needed for wheezing or shortness of breath.    . Calcium Carbonate-Vit D-Min (CALTRATE 600+D PLUS MINERALS) 600-800 MG-UNIT TABS Take 1 tablet by mouth daily.     Marland Kitchen ibuprofen (ADVIL,MOTRIN) 200 MG tablet Take 200 mg by mouth every 6 (six) hours as  needed.    . latanoprost (XALATAN) 0.005 % ophthalmic solution Place 1 drop into the right eye at bedtime.    Marland Kitchen levothyroxine (SYNTHROID, LEVOTHROID) 50 MCG tablet Take 50 mcg by mouth daily before breakfast.     . omeprazole (PRILOSEC OTC) 20 MG tablet Take 20 mg by mouth daily.    Marland Kitchen estradiol (ESTRACE VAGINAL) 0.1 MG/GM vaginal cream Place 1 Applicatorful vaginally at bedtime. Place for 2 weeks then twice a week thereafter (Patient not taking: Reported on 03/07/2018) 42.5 g 12  . furosemide (LASIX) 20 MG tablet Take 1 tablet (20 mg total) by mouth daily. (Patient not taking: Reported on 03/07/2018) 3 tablet 0  . phenazopyridine (PYRIDIUM) 200 MG tablet Take 1 tablet (200 mg total) by mouth 3 (three) times daily as needed for pain. (Patient not taking: Reported on 03/07/2018) 30 tablet 1  . senna-docusate (SENOKOT-S) 8.6-50 MG tablet Take 1 tablet by mouth 2 (two) times daily. While taking strong pain meds to prevent constipation (Patient not taking: Reported on 03/07/2018) 20 tablet 0   No current facility-administered medications for this encounter.     Physical Findings: The patient is in no acute distress. Patient is alert and oriented.  height is 5\' 4"  (1.626 m) and weight is 124 lb (56.2 kg). Her oral temperature is 98 F (36.7 C). Her blood pressure is 125/65 and her pulse is 69. Her respiration is 20 and  oxygen saturation is 100%. .   Lungs with crackles in bilateral lower bases. Heart has regular rate and rhythm. No palpable cervical, supraclavicular, or axillary adenopathy. Abdomen soft, non-tender, normal bowel sounds. Mild swelling in right ankle. Pelvic exam deferred at patient's request.  Lab Findings: Lab Results  Component Value Date   WBC 6.0 11/29/2017   HGB 13.1 11/29/2017   HCT 39.4 11/29/2017   MCV 99.7 11/29/2017   PLT 174 11/29/2017    Radiographic Findings: No results found.  Impression:  Clinically stable. Patient recently examined by Dr.Rossi and found to have a  complete response. Pt would like to delay her pelvic exam a few more weeks as this causes significant discomfort for her and today is one month early as Dr. Denman George examined her 2 months ago.   Plan:  Patient was given vaginal dilators to try with instructions as well. Return in 1 month with follow up on 04/14/2018 at 4:30 with exam. She will see Dr. Tresa Moore on 04/12/2018 for Urology follow up. She will see Dr. Alroy Dust later this week. She may have some mild heart failure with crackles noted in both lung bases.  ____________________________________   Blair Promise, PhD, MD   This document serves as a record of services personally performed by Gery Pray, MD. It was created on his behalf by [facility], a trained medical scribe. The creation of this record is based on the scribe's personal observations and the provider's statements to them. This document has been checked and approved by the attending provider.

## 2018-03-07 NOTE — Progress Notes (Signed)
Pt presents to clinic today for f/u with Dr. Sondra Come. Pt denies dysuria or hematuria. Pt denies vaginal bleeding and reports rare vaginal discharge but none for the past week. Pt denies rectal bleeding, diarrhea, or constipation. Pt denies N/V or abdominal bloating. Pt is accompanied by daughter.   BP 125/65 (BP Location: Right Arm, Patient Position: Sitting, Cuff Size: Normal)   Pulse 69   Temp 98 F (36.7 C) (Oral)   Resp 20   Ht 5\' 4"  (1.626 m)   Wt 124 lb (56.2 kg)   SpO2 100%   BMI 21.28 kg/m   Wt Readings from Last 3 Encounters:  03/07/18 124 lb (56.2 kg)  01/05/18 128 lb 12.8 oz (58.4 kg)  12/09/17 127 lb 4 oz (57.7 kg)   Loma Sousa, RN BSN

## 2018-03-09 DIAGNOSIS — E039 Hypothyroidism, unspecified: Secondary | ICD-10-CM | POA: Diagnosis not present

## 2018-03-09 DIAGNOSIS — M8589 Other specified disorders of bone density and structure, multiple sites: Secondary | ICD-10-CM | POA: Diagnosis not present

## 2018-03-09 DIAGNOSIS — J449 Chronic obstructive pulmonary disease, unspecified: Secondary | ICD-10-CM | POA: Diagnosis not present

## 2018-03-09 DIAGNOSIS — K219 Gastro-esophageal reflux disease without esophagitis: Secondary | ICD-10-CM | POA: Diagnosis not present

## 2018-03-14 ENCOUNTER — Ambulatory Visit
Admission: RE | Admit: 2018-03-14 | Discharge: 2018-03-14 | Disposition: A | Discharge status: Home / Self Care | Payer: Medicare Other | Source: Ambulatory Visit | Attending: Family Medicine | Admitting: Family Medicine

## 2018-03-14 ENCOUNTER — Other Ambulatory Visit: Payer: Self-pay | Admitting: Family Medicine

## 2018-03-14 DIAGNOSIS — R05 Cough: Secondary | ICD-10-CM | POA: Diagnosis not present

## 2018-03-14 DIAGNOSIS — J449 Chronic obstructive pulmonary disease, unspecified: Secondary | ICD-10-CM

## 2018-03-16 NOTE — Addendum Note (Signed)
Encounter addended by: Loma Sousa, RN on: 03/16/2018 3:16 PM  Actions taken: Charge Capture section accepted

## 2018-04-08 DIAGNOSIS — L299 Pruritus, unspecified: Secondary | ICD-10-CM | POA: Diagnosis not present

## 2018-04-12 DIAGNOSIS — C541 Malignant neoplasm of endometrium: Secondary | ICD-10-CM | POA: Diagnosis not present

## 2018-04-12 DIAGNOSIS — C674 Malignant neoplasm of posterior wall of bladder: Secondary | ICD-10-CM | POA: Diagnosis not present

## 2018-04-14 ENCOUNTER — Other Ambulatory Visit: Payer: Self-pay

## 2018-04-14 ENCOUNTER — Encounter: Payer: Self-pay | Admitting: Radiation Oncology

## 2018-04-14 ENCOUNTER — Ambulatory Visit
Admission: RE | Admit: 2018-04-14 | Discharge: 2018-04-14 | Disposition: A | Discharge status: Home / Self Care | Payer: Medicare Other | Source: Ambulatory Visit | Attending: Radiation Oncology | Admitting: Radiation Oncology

## 2018-04-14 VITALS — BP 129/69 | HR 72 | Temp 98.0°F | Resp 20 | Ht 64.0 in | Wt 121.2 lb

## 2018-04-14 DIAGNOSIS — C541 Malignant neoplasm of endometrium: Secondary | ICD-10-CM | POA: Diagnosis not present

## 2018-04-14 DIAGNOSIS — Z08 Encounter for follow-up examination after completed treatment for malignant neoplasm: Secondary | ICD-10-CM | POA: Diagnosis not present

## 2018-04-14 DIAGNOSIS — C7982 Secondary malignant neoplasm of genital organs: Secondary | ICD-10-CM

## 2018-04-14 DIAGNOSIS — C7911 Secondary malignant neoplasm of bladder: Secondary | ICD-10-CM | POA: Diagnosis not present

## 2018-04-14 NOTE — Progress Notes (Signed)
Radiation Oncology         [phone] ________________________________  Name: Tanya Martinez MRN: [id number]  Date: 04/14/2018  DOB: 01-28-1923   Follow-Up Visit Note  CC: Alroy Dust, L.Marlou Sa, MD  Everitt Amber, MD    ICD-10-CM   1. Recurrent carcinoma of endometrium ([place]) C54.1   2. 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  Narrative:  The patient returns today for routine follow-up.  she is doing well overall. She is accompanied by her daughter today. She is followed by Dr. Denman George. She recently saw Dr. Tresa Moore on 04/12/18, who performed a cytoscopy.  On review of systems, she denies dysuria or hematuria, vaginal discharge or bleeding, rectal bleeding, diarrhea, and constipation. She reports trouble with her appetite, but she has recently been given prednisone  She notes feeling better in the last few days because of the medications.                 ALLERGIES:  is allergic to nystatin-triamcinolone; azithromycin; shellfish allergy; and sulfa antibiotics.  Meds: Current Outpatient Medications  Medication Sig Dispense Refill  . albuterol (PROVENTIL HFA;VENTOLIN HFA) 108 (90 Base) MCG/ACT inhaler Inhale 1-2 puffs into the lungs every 4 (four) hours as needed for wheezing or shortness of breath.    . Calcium Carbonate-Vit D-Min (CALTRATE 600+D PLUS MINERALS) 600-800 MG-UNIT TABS Take 1 tablet by mouth daily.     Marland Kitchen ibuprofen (ADVIL,MOTRIN) 200 MG tablet Take 200 mg by mouth every 6 (six) hours as needed.    . latanoprost (XALATAN) 0.005 % ophthalmic solution Place 1 drop into the right eye at bedtime.    Marland Kitchen levothyroxine (SYNTHROID, LEVOTHROID)  50 MCG tablet Take 50 mcg by mouth daily before breakfast.     . omeprazole (PRILOSEC OTC) 20 MG tablet Take 20 mg by mouth daily.    Marland Kitchen estradiol (ESTRACE VAGINAL) 0.1 MG/GM vaginal cream Place 1 Applicatorful vaginally at bedtime. Place for 2 weeks then twice a week thereafter (Patient not taking: Reported on 03/07/2018) 42.5 g 12  . furosemide (LASIX) 20 MG tablet Take 1 tablet (20 mg total) by mouth daily. (Patient not taking: Reported on 03/07/2018) 3 tablet 0  . phenazopyridine (PYRIDIUM) 200 MG tablet Take 1 tablet (200 mg total) by mouth 3 (three) times daily as needed for pain. (Patient not taking: Reported on 03/07/2018) 30 tablet 1  . senna-docusate (SENOKOT-S) 8.6-50 MG tablet Take 1 tablet by mouth 2 (two) times daily. While taking strong pain meds to prevent constipation (Patient not taking: Reported on 03/07/2018) 20 tablet 0   No current facility-administered medications for this encounter.     Physical Findings: The patient is in no acute distress. Patient is alert and oriented.  height is 5\' 4"  (1.626 m) and weight is 121 lb 3.2 oz (55 kg). Her oral temperature is 98 F (36.7 C). Her blood pressure is 129/69 and her pulse is 72. Her respiration is 20 and oxygen saturation is 100%. .   Lungs with crackles in bilateral lower bases. Heart has regular rate and rhythm. No palpable cervical, supraclavicular, or axillary adenopathy. Abdomen soft, non-tender, normal bowel sounds. Pelvic exam  deferred in light of her recent scoping of the bladder and vaginal area by Dr. Tresa Moore on 04/12/18.  Lab Findings: Lab Results  Component Value Date   WBC 6.0 11/29/2017   HGB 13.1 11/29/2017   HCT 39.4 11/29/2017   MCV 99.7 11/29/2017   PLT 174 11/29/2017    Radiographic Findings: No results found.  Impression:  Recurrent endometrial carcinoma (grade 1) at themid vaginaarea as well as posterior dome of the bladder    Clinically stable. no active disease. With cystoscopy, showed no evidence of  recurrence in the bladder. And with Dr. Zettie Pho scoping of the vaginal area, shows no evidence of recurrence in this area.  Plan:  Patient will see Dr. Denman George in 3 months, and follow up with radiation oncology in 6 months.   ____________________________________   Blair Promise, PhD, MD   This document serves as a record of services personally performed by Gery Pray, MD. It was created on his behalf by Wilburn Mylar, a trained medical scribe. The creation of this record is based on the scribe's personal observations and the provider's statements to them. This document has been checked and approved by the attending provider.

## 2018-04-14 NOTE — Progress Notes (Signed)
Pt presents today for f/u with Dr. Sondra Come. Pt is accompanied by daughter. Pt denies dysuria/hematuria. Pt denies vaginal discharge/bleeding. Pt denies rectal bleeding or diarrhea/constipation. Pt reports having cystoscope 2 days prior and urologist, Dr. Tresa Moore, scoped pt's vagina. Pt reports that Dr. Tresa Moore stated vagina was clear of lesions, including bladder wall..   BP 129/69 (BP Location: Right Arm, Patient Position: Sitting)   Pulse 72   Temp 98 F (36.7 C) (Oral)   Resp 20   Ht 5\' 4"  (1.626 m)   Wt 121 lb 3.2 oz (55 kg)   SpO2 100%   BMI 20.80 kg/m   Wt Readings from Last 3 Encounters:  04/14/18 121 lb 3.2 oz (55 kg)  03/07/18 124 lb (56.2 kg)  01/05/18 128 lb 12.8 oz (58.4 kg)   Loma Sousa, RN BSN

## 2018-06-08 DIAGNOSIS — Z23 Encounter for immunization: Secondary | ICD-10-CM | POA: Diagnosis not present

## 2018-06-15 DIAGNOSIS — L509 Urticaria, unspecified: Secondary | ICD-10-CM | POA: Diagnosis not present

## 2018-07-04 ENCOUNTER — Inpatient Hospital Stay: Payer: Medicare Other | Attending: Gynecologic Oncology | Admitting: Gynecologic Oncology

## 2018-07-04 ENCOUNTER — Encounter: Payer: Self-pay | Admitting: Gynecologic Oncology

## 2018-07-04 VITALS — BP 126/63 | HR 74 | Resp 18 | Ht 64.0 in | Wt 122.6 lb

## 2018-07-04 DIAGNOSIS — Z90710 Acquired absence of both cervix and uterus: Secondary | ICD-10-CM | POA: Diagnosis not present

## 2018-07-04 DIAGNOSIS — Z923 Personal history of irradiation: Secondary | ICD-10-CM | POA: Insufficient documentation

## 2018-07-04 DIAGNOSIS — C7982 Secondary malignant neoplasm of genital organs: Secondary | ICD-10-CM | POA: Insufficient documentation

## 2018-07-04 DIAGNOSIS — Z8542 Personal history of malignant neoplasm of other parts of uterus: Secondary | ICD-10-CM | POA: Insufficient documentation

## 2018-07-04 DIAGNOSIS — Z90722 Acquired absence of ovaries, bilateral: Secondary | ICD-10-CM | POA: Insufficient documentation

## 2018-07-04 NOTE — Patient Instructions (Signed)
Please notify Dr Denman George at phone number [phone] if you notice vaginal bleeding, new pelvic or abdominal pains, bloating, feeling full easy, or a change in bladder or bowel function.   Please return to see Dr Sondra Come in February, 2020 and Dr Denman George in May, 2020.

## 2018-07-04 NOTE — Progress Notes (Signed)
Follow-up Note: Gyn-Onc  Consult was requested by Dr. Ulanda Edison for the evaluation of Tanya Martinez 84 y.o. female  CC:  Chief Complaint  Patient presents with  . recurrent endometrial cancer    follow-up    Assessment/Plan:  Tanya Martinez  is a 85 y.o.  year old with history of recurrent endometrial carcinoma (grade 1) at the distal vagina and bladder dome treated with transurethral resection and whole pelvic RT completed 11/03/17.  She has had a complete clinical response to therapy.  I recommend she follow-up with Dr Sondra Come in 3 months and myself in 6 months.  If relapse develops, would consider hormonal therapy.   HPI: Tanya Martinez is a 84 year old P1 who is seen in consultation at the request of Dr Daiva Huge for endometrial cancer recurrence.  The patient has a remote history of a TAH BSO in 1989 with Dr Ulanda Edison for grade 1, stage IA (clinical staging) endometrial cancer.  She was managed expectantly and did well.  In mid October, 2018 she noticed vaginal bleeding and saw Dr Ulanda Edison. On his exam a polypoid outgrowth was noted in the distal third of the left vaginal sidewall. Her vagina is substantially narrowed from atrophy and exam was difficult. The lesion broke off spontaneously with no gross residual remaining. It was sent for pathology which revealed a grade 1 endometrioid adenocarcinoma.   The patient is fairly healthy despite her advanced age. She has pulmonary fibrosis. She has had 1 cesarean section.  Interval Hx.  PET on 07/22/17 showed a 3cm hypermetabolic pelvic mass involving the posterior dome of the urinary bladder and adjacent fat consistent with recurrent endometrial carcinoma.   She underwent a transurethral resection of the bladder mass with Dr Tresa Moore on 08/25/17.  Final pathology revealed adenocarcinoma with squamous differentiation consistent with endometrial primary.   She then went on to complete whole pelvic XRT: Radiation treatment dates:   09/29/2017 -  11/03/2017 Site/dose:   Pelvis/ 45 Gy delivered in 25 fractions of 1.8 Gy  She had substantial GU and GI toxicity with radiation.   Current Meds:  Outpatient Encounter Medications as of 07/04/2018  Medication Sig  . albuterol (PROVENTIL HFA;VENTOLIN HFA) 108 (90 Base) MCG/ACT inhaler Inhale 1-2 puffs into the lungs every 4 (four) hours as needed for wheezing or shortness of breath.  . Calcium Carbonate-Vit D-Min (CALTRATE 600+D PLUS MINERALS) 600-800 MG-UNIT TABS Take 1 tablet by mouth daily.   Marland Kitchen latanoprost (XALATAN) 0.005 % ophthalmic solution Place 1 drop into the right eye at bedtime.  Marland Kitchen levothyroxine (SYNTHROID, LEVOTHROID) 50 MCG tablet Take 50 mcg by mouth daily before breakfast.   . omeprazole (PRILOSEC OTC) 20 MG tablet Take 20 mg by mouth daily.  . [DISCONTINUED] estradiol (ESTRACE VAGINAL) 0.1 MG/GM vaginal cream Place 1 Applicatorful vaginally at bedtime. Place for 2 weeks then twice a week thereafter  . [DISCONTINUED] furosemide (LASIX) 20 MG tablet Take 1 tablet (20 mg total) by mouth daily.  . [DISCONTINUED] phenazopyridine (PYRIDIUM) 200 MG tablet Take 1 tablet (200 mg total) by mouth 3 (three) times daily as needed for pain.  . [DISCONTINUED] senna-docusate (SENOKOT-S) 8.6-50 MG tablet Take 1 tablet by mouth 2 (two) times daily. While taking strong pain meds to prevent constipation  . ibuprofen (ADVIL,MOTRIN) 200 MG tablet Take 200 mg by mouth every 6 (six) hours as needed.   No facility-administered encounter medications on file as of 07/04/2018.     Allergy:  Allergies  Allergen Reactions  . Nystatin-Triamcinolone Other (See  Comments)    Redness, burning in the external vaginal area, broke out all over bottom and vaginal external area.  . Azithromycin     GI Upset (intolerance)  . Shellfish Allergy Nausea Only  . Sulfa Antibiotics Other (See Comments)    "Lips felt numb"    Social Hx:   Social History   Socioeconomic History  . Marital status: Widowed     Spouse name: Not on file  . Number of children: 1  . Years of education: Not on file  . Highest education level: Not on file  Occupational History  . Occupation: retired  Scientific laboratory technician  . Financial resource strain: Not on file  . Food insecurity:    Worry: Not on file    Inability: Not on file  . Transportation needs:    Medical: Not on file    Non-medical: Not on file  Tobacco Use  . Smoking status: Never Smoker  . Smokeless tobacco: Never Used  Substance and Sexual Activity  . Alcohol use: Yes    Comment: occasionally  . Drug use: No  . Sexual activity: Not on file  Lifestyle  . Physical activity:    Days per week: 7 days    Minutes per session: 10 min  . Stress: Not on file  Relationships  . Social connections:    Talks on phone: Not on file    Gets together: Not on file    Attends religious service: Not on file    Active member of club or organization: Not on file    Attends meetings of clubs or organizations: Not on file    Relationship status: Not on file  . Intimate partner violence:    Fear of current or ex partner: Not on file    Emotionally abused: Not on file    Physically abused: Not on file    Forced sexual activity: Not on file  Other Topics Concern  . Not on file  Social History Narrative  . Not on file    Past Surgical Hx:  Past Surgical History:  Procedure Laterality Date  . BREAST LUMPECTOMY     2 lumps removed from rt breast  . CESAREAN SECTION    . COLONOSCOPY    . CYSTOSCOPY W/ RETROGRADES Bilateral 08/25/2017   Procedure: CYSTOSCOPY WITH BILATERAL RETROGRADE PYELOGRAM;  Surgeon: Alexis Frock, MD;  Location: [facility];  Service: Urology;  Laterality: Bilateral;  . EYE SURGERY     detached retina  . HEMORRHOID SURGERY    . HYSTERECTOMY ABDOMINAL WITH SALPINGECTOMY  1989  . THYROID SURGERY     growth removed  . TRANSURETHRAL RESECTION OF BLADDER TUMOR N/A 08/25/2017   Procedure: TRANSURETHRAL RESECTION OF BLADDER TUMOR (TURBT);  Surgeon:  Alexis Frock, MD;  Location: [facility];  Service: Urology;  Laterality: N/A;    Past Medical Hx:  Past Medical History:  Diagnosis Date  . Acid reflux   . Colon polyps   . Glaucoma   . History of radiation therapy 09/29/17-11/03/17   45 Gy to the pelvis in 25 fractions  . Hypothyroidism   . Pneumonia   . Uterine cancer [facility])     Past Gynecological History:  C/s x 1, history of stage I endometrial cancer in 1989. No LMP recorded. Patient is postmenopausal.  Family Hx:  Family History  Problem Relation Age of Onset  . Heart attack Mother   . Heart disease Mother   . Heart attack Brother   . Heart disease Brother   .  Heart attack Sister   . Heart disease Sister   . Stroke Father     Review of Systems:  Constitutional  Feels well,    ENT Normal appearing ears and nares bilaterally Skin/Breast  No rash, sores, jaundice, itching, dryness Cardiovascular  No chest pain, shortness of breath, or edema  Pulmonary  No cough or wheeze.  Gastro Intestinal  No nausea, vomitting, or diarrhoea. No bright red blood per rectum, no abdominal pain, change in bowel movement, or constipation.  Genito Urinary  No frequency, urgency, dysuria, no vaginal spotting, + vulvar irritation. Musculo Skeletal  No myalgia, arthralgia, joint swelling or pain  Neurologic  No weakness, numbness, change in gait,  Psychology  No depression, anxiety, insomnia.   Vitals:  Blood pressure 126/63, pulse 74, resp. rate 18, height 5\' 4"  (1.626 m), weight 122 lb 9.6 oz (55.6 kg), SpO2 99 %.  Physical Exam: WD in NAD Neck  Supple NROM, without any enlargements.  Lymph Node Survey No cervical supraclavicular or inguinal adenopathy Cardiovascular  Pulse normal rate, regularity and rhythm. S1 and S2 normal.  Lungs  Clear to auscultation bilateraly, without wheezes/crackles/rhonchi. Good air movement.  Skin  No rash/lesions/breakdown  Psychiatry  Alert and oriented to person, place, and time  Abdomen   Normoactive bowel sounds, abdomen soft, non-tender and thin without evidence of hernia.  Back No CVA tenderness Genito Urinary  Vulva/vagina: radiation changes and wet desquamation to labia majora. No cellulitis.   Bladder/urethra:  No lesions or masses, well supported bladder  Vagina: very narrow introitus, accomodates narrow speculum and single digit on exam. No visible or palpable residual disease on left sidewall.   Cervix and uterus surgically absent  Adnexa: no palpable masses. Rectal  deferred Extremities  No bilateral cyanosis, clubbing or edema.   Thereasa Solo, MD  07/04/2018, 4:10 PM

## 2018-08-26 DIAGNOSIS — F17290 Nicotine dependence, other tobacco product, uncomplicated: Secondary | ICD-10-CM | POA: Diagnosis not present

## 2018-08-26 DIAGNOSIS — M84350G Stress fracture, pelvis, subsequent encounter for fracture with delayed healing: Secondary | ICD-10-CM | POA: Diagnosis not present

## 2018-08-26 DIAGNOSIS — Z7901 Long term (current) use of anticoagulants: Secondary | ICD-10-CM | POA: Diagnosis not present

## 2018-08-26 DIAGNOSIS — R600 Localized edema: Secondary | ICD-10-CM | POA: Diagnosis not present

## 2018-08-26 DIAGNOSIS — M5432 Sciatica, left side: Secondary | ICD-10-CM | POA: Diagnosis not present

## 2018-08-26 DIAGNOSIS — R630 Anorexia: Secondary | ICD-10-CM | POA: Diagnosis not present

## 2018-08-26 DIAGNOSIS — I731 Thromboangiitis obliterans [Buerger's disease]: Secondary | ICD-10-CM | POA: Diagnosis not present

## 2018-08-26 DIAGNOSIS — E039 Hypothyroidism, unspecified: Secondary | ICD-10-CM | POA: Diagnosis not present

## 2018-09-20 DIAGNOSIS — M25551 Pain in right hip: Secondary | ICD-10-CM | POA: Diagnosis not present

## 2018-09-21 DIAGNOSIS — H4051X3 Glaucoma secondary to other eye disorders, right eye, severe stage: Secondary | ICD-10-CM | POA: Diagnosis not present

## 2018-09-21 DIAGNOSIS — H40002 Preglaucoma, unspecified, left eye: Secondary | ICD-10-CM | POA: Diagnosis not present

## 2018-09-21 DIAGNOSIS — Z961 Presence of intraocular lens: Secondary | ICD-10-CM | POA: Diagnosis not present

## 2018-09-24 ENCOUNTER — Inpatient Hospital Stay (HOSPITAL_COMMUNITY)
Admission: EM | Admit: 2018-09-24 | Discharge: 2018-09-28 | DRG: 556 | Disposition: A | Discharge status: Skilled Nursing Facility | Payer: Medicare Other | Attending: Internal Medicine | Admitting: Internal Medicine

## 2018-09-24 ENCOUNTER — Emergency Department (HOSPITAL_COMMUNITY): Payer: Medicare Other

## 2018-09-24 ENCOUNTER — Encounter (HOSPITAL_COMMUNITY): Payer: Self-pay

## 2018-09-24 ENCOUNTER — Other Ambulatory Visit: Payer: Self-pay

## 2018-09-24 DIAGNOSIS — M8448XA Pathological fracture, other site, initial encounter for fracture: Secondary | ICD-10-CM | POA: Diagnosis not present

## 2018-09-24 DIAGNOSIS — J45909 Unspecified asthma, uncomplicated: Secondary | ICD-10-CM | POA: Diagnosis present

## 2018-09-24 DIAGNOSIS — M25552 Pain in left hip: Secondary | ICD-10-CM | POA: Diagnosis not present

## 2018-09-24 DIAGNOSIS — Z91013 Allergy to seafood: Secondary | ICD-10-CM

## 2018-09-24 DIAGNOSIS — Z888 Allergy status to other drugs, medicaments and biological substances status: Secondary | ICD-10-CM

## 2018-09-24 DIAGNOSIS — R001 Bradycardia, unspecified: Secondary | ICD-10-CM | POA: Diagnosis not present

## 2018-09-24 DIAGNOSIS — E039 Hypothyroidism, unspecified: Secondary | ICD-10-CM | POA: Diagnosis present

## 2018-09-24 DIAGNOSIS — I700 Atherosclerosis of aorta: Secondary | ICD-10-CM | POA: Diagnosis present

## 2018-09-24 DIAGNOSIS — Z8719 Personal history of other diseases of the digestive system: Secondary | ICD-10-CM

## 2018-09-24 DIAGNOSIS — R52 Pain, unspecified: Secondary | ICD-10-CM | POA: Diagnosis not present

## 2018-09-24 DIAGNOSIS — Z882 Allergy status to sulfonamides status: Secondary | ICD-10-CM

## 2018-09-24 DIAGNOSIS — M160 Bilateral primary osteoarthritis of hip: Secondary | ICD-10-CM | POA: Diagnosis not present

## 2018-09-24 DIAGNOSIS — N281 Cyst of kidney, acquired: Secondary | ICD-10-CM | POA: Diagnosis not present

## 2018-09-24 DIAGNOSIS — M25551 Pain in right hip: Secondary | ICD-10-CM | POA: Diagnosis present

## 2018-09-24 DIAGNOSIS — Z923 Personal history of irradiation: Secondary | ICD-10-CM

## 2018-09-24 DIAGNOSIS — D72829 Elevated white blood cell count, unspecified: Secondary | ICD-10-CM

## 2018-09-24 DIAGNOSIS — M545 Low back pain, unspecified: Secondary | ICD-10-CM

## 2018-09-24 DIAGNOSIS — Z79899 Other long term (current) drug therapy: Secondary | ICD-10-CM

## 2018-09-24 DIAGNOSIS — Z8542 Personal history of malignant neoplasm of other parts of uterus: Secondary | ICD-10-CM

## 2018-09-24 DIAGNOSIS — K5900 Constipation, unspecified: Secondary | ICD-10-CM | POA: Diagnosis present

## 2018-09-24 DIAGNOSIS — Z79890 Hormone replacement therapy (postmenopausal): Secondary | ICD-10-CM

## 2018-09-24 DIAGNOSIS — K219 Gastro-esophageal reflux disease without esophagitis: Secondary | ICD-10-CM | POA: Diagnosis not present

## 2018-09-24 DIAGNOSIS — Z823 Family history of stroke: Secondary | ICD-10-CM

## 2018-09-24 DIAGNOSIS — H409 Unspecified glaucoma: Secondary | ICD-10-CM | POA: Diagnosis present

## 2018-09-24 DIAGNOSIS — K449 Diaphragmatic hernia without obstruction or gangrene: Secondary | ICD-10-CM

## 2018-09-24 DIAGNOSIS — Z90710 Acquired absence of both cervix and uterus: Secondary | ICD-10-CM

## 2018-09-24 DIAGNOSIS — K222 Esophageal obstruction: Secondary | ICD-10-CM | POA: Diagnosis present

## 2018-09-24 DIAGNOSIS — Z7952 Long term (current) use of systemic steroids: Secondary | ICD-10-CM

## 2018-09-24 DIAGNOSIS — Z8249 Family history of ischemic heart disease and other diseases of the circulatory system: Secondary | ICD-10-CM

## 2018-09-24 LAB — COMPREHENSIVE METABOLIC PANEL
ALT: 19 U/L (ref 0–44)
AST: 26 U/L (ref 15–41)
Albumin: 4.1 g/dL (ref 3.5–5.0)
Alkaline Phosphatase: 55 U/L (ref 38–126)
Anion gap: 10 (ref 5–15)
BUN: 27 mg/dL — ABNORMAL HIGH (ref 8–23)
CALCIUM: 8.9 mg/dL (ref 8.9–10.3)
CO2: 24 mmol/L (ref 22–32)
Chloride: 105 mmol/L (ref 98–111)
Creatinine, Ser: 0.64 mg/dL (ref 0.44–1.00)
GFR calc Af Amer: >60 mL/min (ref >60)
GFR calc non Af Amer: >60 mL/min (ref >60)
Glucose, Bld: 111 mg/dL — ABNORMAL HIGH (ref 70–99)
Potassium: 4.1 mmol/L (ref 3.5–5.1)
Sodium: 139 mmol/L (ref 135–145)
Total Bilirubin: 0.9 mg/dL (ref 0.3–1.2)
Total Protein: 7 g/dL (ref 6.5–8.1)

## 2018-09-24 LAB — URINALYSIS, ROUTINE W REFLEX MICROSCOPIC
Bilirubin Urine: NEGATIVE
Glucose, UA: NEGATIVE mg/dL
Hgb urine dipstick: NEGATIVE
Ketones, ur: NEGATIVE mg/dL
Leukocytes, UA: NEGATIVE
Nitrite: NEGATIVE
PROTEIN: NEGATIVE mg/dL
Specific Gravity, Urine: 1.012 (ref 1.005–1.030)
pH: 6 (ref 5.0–8.0)

## 2018-09-24 LAB — CBC WITH DIFFERENTIAL/PLATELET
Abs Immature Granulocytes: 0.13 10*3/uL — ABNORMAL HIGH (ref 0.00–0.07)
Basophils Absolute: 0 10*3/uL (ref 0.0–0.1)
Basophils Relative: 0 %
EOS ABS: 0 10*3/uL (ref 0.0–0.5)
Eosinophils Relative: 0 %
HCT: 40.4 % (ref 36.0–46.0)
Hemoglobin: 13.4 g/dL (ref 12.0–15.0)
Immature Granulocytes: 1 %
Lymphocytes Relative: 3 %
Lymphs Abs: 0.3 10*3/uL — ABNORMAL LOW (ref 0.7–4.0)
MCH: 33.4 pg (ref 26.0–34.0)
MCHC: 33.2 g/dL (ref 30.0–36.0)
MCV: 100.7 fL — ABNORMAL HIGH (ref 80.0–100.0)
MONOS PCT: 3 %
Monocytes Absolute: 0.4 10*3/uL (ref 0.1–1.0)
NEUTROS PCT: 93 %
Neutro Abs: 9.9 10*3/uL — ABNORMAL HIGH (ref 1.7–7.7)
Platelets: 156 10*3/uL (ref 150–400)
RBC: 4.01 MIL/uL (ref 3.87–5.11)
RDW: 13 % (ref 11.5–15.5)
WBC: 10.7 10*3/uL — ABNORMAL HIGH (ref 4.0–10.5)
nRBC: 0 % (ref 0.0–0.2)

## 2018-09-24 MED ORDER — TIZANIDINE HCL 4 MG PO TABS
2.0000 mg | ORAL_TABLET | Freq: Once | ORAL | Status: AC
  Administered 2018-09-24: 2 mg via ORAL
  Filled 2018-09-24: qty 1

## 2018-09-24 MED ORDER — HYDROCODONE-ACETAMINOPHEN 5-325 MG PO TABS
1.0000 | ORAL_TABLET | Freq: Once | ORAL | Status: AC
  Administered 2018-09-24: 1 via ORAL
  Filled 2018-09-24: qty 1

## 2018-09-24 MED ORDER — FENTANYL CITRATE (PF) 100 MCG/2ML IJ SOLN
50.0000 ug | Freq: Once | INTRAMUSCULAR | Status: AC
  Administered 2018-09-24: 50 ug via INTRAVENOUS
  Filled 2018-09-24: qty 2

## 2018-09-24 MED ORDER — HYDROCODONE-ACETAMINOPHEN 5-325 MG PO TABS
1.0000 | ORAL_TABLET | Freq: Four times a day (QID) | ORAL | Status: DC | PRN
  Administered 2018-09-25 – 2018-09-26 (×5): 1 via ORAL
  Filled 2018-09-24 (×5): qty 1

## 2018-09-24 MED ORDER — IOPAMIDOL (ISOVUE-300) INJECTION 61%
INTRAVENOUS | Status: AC
  Administered 2018-09-24: 100 mL
  Filled 2018-09-24: qty 100

## 2018-09-24 MED ORDER — CALCIUM CARBONATE-VITAMIN D 500-200 MG-UNIT PO TABS
1.0000 | ORAL_TABLET | Freq: Every day | ORAL | Status: DC
  Administered 2018-09-25 – 2018-09-28 (×4): 1 via ORAL
  Filled 2018-09-24 (×4): qty 1

## 2018-09-24 MED ORDER — LIDOCAINE 5 % EX PTCH
1.0000 | MEDICATED_PATCH | CUTANEOUS | Status: DC
  Administered 2018-09-24 – 2018-09-27 (×4): 1 via TRANSDERMAL
  Filled 2018-09-24 (×5): qty 1

## 2018-09-24 MED ORDER — SODIUM CHLORIDE (PF) 0.9 % IJ SOLN
INTRAMUSCULAR | Status: AC
  Filled 2018-09-24: qty 50

## 2018-09-24 MED ORDER — PANTOPRAZOLE SODIUM 40 MG PO TBEC
40.0000 mg | DELAYED_RELEASE_TABLET | Freq: Every day | ORAL | Status: DC
  Administered 2018-09-25 – 2018-09-28 (×4): 40 mg via ORAL
  Filled 2018-09-24 (×3): qty 1

## 2018-09-24 MED ORDER — ALBUTEROL SULFATE (2.5 MG/3ML) 0.083% IN NEBU: 2.5000 mg | INHALATION_SOLUTION | Freq: Four times a day (QID) | RESPIRATORY_TRACT | Status: DC | PRN

## 2018-09-24 MED ORDER — ENOXAPARIN SODIUM 40 MG/0.4ML ~~LOC~~ SOLN
40.0000 mg | SUBCUTANEOUS | Status: DC
  Administered 2018-09-25 – 2018-09-28 (×4): 40 mg via SUBCUTANEOUS
  Filled 2018-09-24 (×4): qty 0.4

## 2018-09-24 MED ORDER — ACETAMINOPHEN 325 MG PO TABS: 650.0000 mg | ORAL_TABLET | Freq: Four times a day (QID) | ORAL | Status: DC | PRN

## 2018-09-24 MED ORDER — LIP MEDEX EX OINT
1.0000 "application " | TOPICAL_OINTMENT | CUTANEOUS | Status: DC | PRN
  Administered 2018-09-25: 1 via TOPICAL
  Filled 2018-09-24: qty 7

## 2018-09-24 MED ORDER — LEVOTHYROXINE SODIUM 50 MCG PO TABS
50.0000 ug | ORAL_TABLET | Freq: Every day | ORAL | Status: DC
  Administered 2018-09-25 – 2018-09-28 (×4): 50 ug via ORAL
  Filled 2018-09-24 (×4): qty 1

## 2018-09-24 MED ORDER — LATANOPROST 0.005 % OP SOLN
1.0000 [drp] | Freq: Every day | OPHTHALMIC | Status: DC
  Administered 2018-09-25 – 2018-09-27 (×4): 1 [drp] via OPHTHALMIC
  Filled 2018-09-24: qty 2.5

## 2018-09-24 NOTE — ED Provider Notes (Signed)
[place] DEPT Provider Note   CSN: [id number] Arrival date & time: 09/24/18  1353     History   Chief Complaint Chief Complaint  Patient presents with  . Bilateral Hip Pain    HPI Tanya Martinez is a 85 y.o. female.  The history is provided by the patient and medical records. No language interpreter was used.   Tanya Martinez is a 85 y.o. female who presents to the Emergency Department complaining of hip pain. She presents to the emergency department accompanied by her daughter for evaluation of hip pain. Symptoms began about one week ago with severe right posterior hip pain that radiates across her low back. Pain is constant but worse with movement. She lives at home alone and normally ambulates without assistance. She began using a walker this week and now she is unable to ambulate at all. She denies any fevers, abdominal pain, nausea, vomiting, diarrhea, dysuria. She saw her primary care provider earlier this week and was started on prednisone and tramadol with no improvement in her symptoms. She does have a history of endometrial cancer status post resection, radiation. No prior similar symptoms. Past Medical History:  Diagnosis Date  . Acid reflux   . Colon polyps   . Glaucoma   . History of radiation therapy 09/29/17-11/03/17   45 Gy to the pelvis in 25 fractions  . Hypothyroidism   . Pneumonia   . Uterine cancer [facility])     Patient Active Problem List   Diagnosis Date Noted  . Hip pain, bilateral 09/24/2018  . Low back pain 09/24/2018  . Leukocytosis 09/24/2018  . Bladder cancer ([place]) 08/25/2017  . 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 Recurrent carcinoma of endometrium ([place]) 07/07/2017  . Status post intraocular lens implant 11/08/2014  . UTI (urinary tract infection) 03/05/2013  . History of detached retina repair 04/12/2012  . Secondary open-angle glaucoma 10/18/2011  . Diarrhea 05/25/2011  . Dysphagia,  unspecified(787.20) 05/25/2011  . GERD with stricture 05/25/2011  . Hernia, hiatal 05/25/2011  . Special screening for malignant neoplasms, colon 05/25/2011    Past Surgical History:  Procedure Laterality Date  . BREAST LUMPECTOMY     2 lumps removed from rt breast  . CESAREAN SECTION    . COLONOSCOPY    . CYSTOSCOPY W/ RETROGRADES Bilateral 08/25/2017   Procedure: CYSTOSCOPY WITH BILATERAL RETROGRADE PYELOGRAM;  Surgeon: Alexis Frock, MD;  Location: [facility];  Service: Urology;  Laterality: Bilateral;  . EYE SURGERY     detached retina  . HEMORRHOID SURGERY    . HYSTERECTOMY ABDOMINAL WITH SALPINGECTOMY  1989  . THYROID SURGERY     growth removed  . TRANSURETHRAL RESECTION OF BLADDER TUMOR N/A 08/25/2017   Procedure: TRANSURETHRAL RESECTION OF BLADDER TUMOR (TURBT);  Surgeon: Alexis Frock, MD;  Location: [facility];  Service: Urology;  Laterality: N/A;     OB History   No obstetric history on file.      Home Medications    Prior to Admission medications   Medication Sig Start Date End Date Taking? Authorizing Provider  albuterol (PROVENTIL HFA;VENTOLIN HFA) 108 (90 Base) MCG/ACT inhaler Inhale 1-2 puffs into the lungs every 4 (four) hours as needed for wheezing or shortness of breath.   Yes [provider]  Calcium Carbonate-Vit D-Min (CALTRATE 600+D PLUS MINERALS) 600-800 MG-UNIT TABS Take 1 tablet by mouth daily.    Yes [provider]  ibuprofen (ADVIL,MOTRIN) 200 MG tablet Take 200 mg by mouth every 6 (  six) hours as needed.   Yes [provider]  latanoprost (XALATAN) 0.005 % ophthalmic solution Place 1 drop into both eyes at bedtime.    Yes [provider]  levothyroxine (SYNTHROID, LEVOTHROID) 50 MCG tablet Take 50 mcg by mouth daily before breakfast.    Yes [provider]  omeprazole (PRILOSEC OTC) 20 MG tablet Take 20 mg by mouth daily.   Yes [provider]  predniSONE (DELTASONE) 20 MG tablet Take 20-60 mg by mouth  daily. Trapper 3-3-2-2-1-1 09/22/18  Yes [provider]  traMADol (ULTRAM) 50 MG tablet Take 50 mg by mouth every 6 (six) hours as needed for moderate pain.  09/22/18  Yes [provider]    Family History Family History  Problem Relation Age of Onset  . Heart attack Mother   . Heart disease Mother   . Heart attack Brother   . Heart disease Brother   . Heart attack Sister   . Heart disease Sister   . Stroke Father     Social History Social History   Tobacco Use  . Smoking status: Never Smoker  . Smokeless tobacco: Never Used  Substance Use Topics  . Alcohol use: Yes    Comment: occasionally  . Drug use: No     Allergies   Nystatin-triamcinolone; Azithromycin; Shellfish allergy; and Sulfa antibiotics   Review of Systems Review of Systems  All other systems reviewed and are negative.    Physical Exam Updated Vital Signs BP (!) 121/57 (BP Location: Right Arm)   Pulse (!) 41   Temp 98.1 F (36.7 C) (Oral)   Resp 17   Ht 5\' 4"  (1.626 m)   Wt 54.6 kg   SpO2 95%   BMI 20.66 kg/m   Physical Exam Vitals signs and nursing note reviewed.  Constitutional:      Appearance: She is well-developed.  HENT:     Head: Normocephalic and atraumatic.  Cardiovascular:     Rate and Rhythm: Regular rhythm. Bradycardia present.     Heart sounds: No murmur.  Pulmonary:     Effort: Pulmonary effort is normal. No respiratory distress.     Breath sounds: Normal breath sounds.  Abdominal:     Palpations: Abdomen is soft.     Tenderness: There is no abdominal tenderness. There is no guarding or rebound.  Musculoskeletal:     Comments: 2+ DP pulses bilaterally. There is no palpable tenderness to palpation there is no significant tenderness to palpation over the hips bilaterally. There is significant pain on elevation of the right lower extremity referred to the right low back.  Skin:    General: Skin is warm and dry.  Neurological:     Mental Status: She is alert  and oriented to person, place, and time.     Comments: Five out of five strength in all four extremities with sensation to light touch intact in all four extremities  Psychiatric:        Behavior: Behavior normal.      ED Treatments / Results  Labs (all labs ordered are listed, but only abnormal results are displayed) Labs Reviewed  COMPREHENSIVE METABOLIC PANEL - Abnormal; Notable for the following components:      Result Value   Glucose, Bld 111 (*)    BUN 27 (*)    All other components within normal limits  CBC WITH DIFFERENTIAL/PLATELET - Abnormal; Notable for the following components:   WBC 10.7 (*)    MCV 100.7 (*)  Neutro Abs 9.9 (*)    Lymphs Abs 0.3 (*)    Abs Immature Granulocytes 0.13 (*)    All other components within normal limits  URINALYSIS, ROUTINE W REFLEX MICROSCOPIC  CBC    EKG None  Radiology Ct Abdomen Pelvis W Contrast  Result Date: 09/24/2018 CLINICAL DATA:  Acute right flank pain. History of endometrial cancer. EXAM: CT ABDOMEN AND PELVIS WITH CONTRAST TECHNIQUE: Multidetector CT imaging of the abdomen and pelvis was performed using the standard protocol following bolus administration of intravenous contrast. CONTRAST:  116mL ISOVUE-300 IOPAMIDOL (ISOVUE-300) INJECTION 61% COMPARISON:  PET scan of July 22, 2017. FINDINGS: Lower chest: Stable chronic interstitial densities are noted. Large hiatal hernia is noted. Hepatobiliary: No focal liver abnormality is seen. No gallstones, gallbladder wall thickening, or biliary dilatation. Pancreas: Unremarkable. No pancreatic ductal dilatation or surrounding inflammatory changes. Spleen: Normal in size without focal abnormality. Adrenals/Urinary Tract: Adrenal glands appear normal. Parapelvic cysts are seen in the left kidney. No hydronephrosis or renal obstruction is noted. Urinary bladder is unremarkable. No renal or ureteral calculi are noted. Stomach/Bowel: The stomach appears normal. There is no evidence of  bowel obstruction or inflammation. The appendix is not visualized, but no inflammation is noted in the right lower quadrant. Vascular/Lymphatic: Aortic atherosclerosis. No enlarged abdominal or pelvic lymph nodes. Reproductive: Status post hysterectomy. No adnexal masses. Other: No abdominal wall hernia or abnormality. No abdominopelvic ascites. Musculoskeletal: No acute or significant osseous findings. IMPRESSION: Large sliding-type hiatal hernia. No acute abnormality seen in the abdomen or pelvis. Aortic Atherosclerosis (ICD10-I70.0). Electronically Signed   By: Marijo Conception, M.D.   On: 09/24/2018 18:23   Dg Hip Unilat  With Pelvis 2-3 Views Right  Result Date: 09/24/2018 CLINICAL DATA:  Acute bilateral hip pain without known injury. EXAM: DG HIP (WITH OR WITHOUT PELVIS) 2-3V RIGHT COMPARISON:  None. FINDINGS: There is no evidence of hip fracture or dislocation. No significant joint space narrowing is noted. Sclerosis is seen involving superior portion of both femoral heads concerning for possible avascular necrosis. IMPRESSION: No fracture or dislocation is noted. Sclerosis noted in superior portion of both femoral heads concerning for possible avascular necrosis. Electronically Signed   By: Marijo Conception, M.D.   On: 09/24/2018 17:06    Procedures Procedures (including critical care time)  Medications Ordered in ED Medications  sodium chloride (PF) 0.9 % injection (0 mLs  Hold 09/24/18 1857)  lidocaine (LIDODERM) 5 % 1 patch (1 patch Transdermal Patch Applied 09/24/18 2057)  HYDROcodone-acetaminophen (NORCO/VICODIN) 5-325 MG per tablet 1 tablet (has no administration in time range)  acetaminophen (TYLENOL) tablet 650 mg (has no administration in time range)  levothyroxine (SYNTHROID, LEVOTHROID) tablet 50 mcg (has no administration in time range)  pantoprazole (PROTONIX) EC tablet 40 mg (has no administration in time range)  calcium-vitamin D (OSCAL WITH D) 500-200 MG-UNIT per tablet 1 tablet  (has no administration in time range)  albuterol (PROVENTIL) (2.5 MG/3ML) 0.083% nebulizer solution 2.5 mg (has no administration in time range)  latanoprost (XALATAN) 0.005 % ophthalmic solution 1 drop (1 drop Both Eyes Given 09/25/18 0008)  enoxaparin (LOVENOX) injection 40 mg (has no administration in time range)  lip balm (CARMEX) ointment 1 application (1 application Topical Given 09/25/18 0009)  fentaNYL (SUBLIMAZE) injection 50 mcg (50 mcg Intravenous Given 09/24/18 1547)  iopamidol (ISOVUE-300) 61 % injection (100 mLs  Contrast Given 09/24/18 1737)  tiZANidine (ZANAFLEX) tablet 2 mg (2 mg Oral Given 09/24/18 2056)  HYDROcodone-acetaminophen (NORCO/VICODIN) 5-325 MG per  tablet 1 tablet (1 tablet Oral Given 09/24/18 2055)     Initial Impression / Assessment and Plan / ED Course  I have reviewed the triage vital signs and the nursing notes.  Pertinent labs & imaging results that were available during my care of the patient were reviewed by me and considered in my medical decision making (see chart for details).     Patient with history of endometrial cancer here for evaluation of atraumatic back pain. She is neurovascularly intact on examination. She has significant pain that limits ambulation. No evidence of acute infectious process or kidney injury. CT abdomen obtained, which is negative for obstructing stone, mass. Her pain was treated in the emergency department. On repeat assessment her pain is significantly improved but now she is unable to stand due to unsteadiness due to her sedating medications. Medicine consulted for observation for symptom management.  Final Clinical Impressions(s) / ED Diagnoses   Final diagnoses:  None    ED Discharge Orders    None       Quintella Reichert, MD 09/25/18 [phone]

## 2018-09-24 NOTE — H&P (Addendum)
History and Physical    Tanya Martinez NIO:[id number] DOB: Dec 24, 1922 DOA: 09/24/2018  PCP: Alroy Dust, L.Marlou Sa, MD Patient coming from: Home  Chief Complaint: Bilateral hip pain  HPI: Tanya Martinez is a 85 y.o. female with medical history significant of endometrial cancer, hypothyroidism, GERD presenting to the hospital for evaluation of bilateral hip pain.  History provided by patient and daughter at bedside.  Patient has been experiencing bilateral hip and lower back pain for the past 1 week.  Her PCP had recently prescribed tramadol and prednisone which did not help.  Daughter states patient's pain was much worse today and ambulance had to be called to bring her into the hospital.  She has been having difficulty walking secondary to pain.  No fevers or chills.  Review of Systems: As per HPI otherwise 10 point review of systems negative.  Past Medical History:  Diagnosis Date  . Acid reflux   . Colon polyps   . Glaucoma   . History of radiation therapy 09/29/17-11/03/17   45 Gy to the pelvis in 25 fractions  . Hypothyroidism   . Pneumonia   . Uterine cancer [facility])     Past Surgical History:  Procedure Laterality Date  . BREAST LUMPECTOMY     2 lumps removed from rt breast  . CESAREAN SECTION    . COLONOSCOPY    . CYSTOSCOPY W/ RETROGRADES Bilateral 08/25/2017   Procedure: CYSTOSCOPY WITH BILATERAL RETROGRADE PYELOGRAM;  Surgeon: Alexis Frock, MD;  Location: [facility];  Service: Urology;  Laterality: Bilateral;  . EYE SURGERY     detached retina  . HEMORRHOID SURGERY    . HYSTERECTOMY ABDOMINAL WITH SALPINGECTOMY  1989  . THYROID SURGERY     growth removed  . TRANSURETHRAL RESECTION OF BLADDER TUMOR N/A 08/25/2017   Procedure: TRANSURETHRAL RESECTION OF BLADDER TUMOR (TURBT);  Surgeon: Alexis Frock, MD;  Location: [facility];  Service: Urology;  Laterality: N/A;     reports that she has never smoked. She has never used smokeless tobacco. She reports current alcohol use. She reports  that she does not use drugs.  Allergies  Allergen Reactions  . Nystatin-Triamcinolone Other (See Comments)    Redness, burning in the external vaginal area, broke out all over bottom and vaginal external area.  . Azithromycin     GI Upset (intolerance)  . Shellfish Allergy Nausea Only  . Sulfa Antibiotics Other (See Comments)    "Lips felt numb"    Family History  Problem Relation Age of Onset  . Heart attack Mother   . Heart disease Mother   . Heart attack Brother   . Heart disease Brother   . Heart attack Sister   . Heart disease Sister   . Stroke Father     Prior to Admission medications   Medication Sig Start Date End Date Taking? Authorizing Provider  albuterol (PROVENTIL HFA;VENTOLIN HFA) 108 (90 Base) MCG/ACT inhaler Inhale 1-2 puffs into the lungs every 4 (four) hours as needed for wheezing or shortness of breath.   Yes [provider]  Calcium Carbonate-Vit D-Min (CALTRATE 600+D PLUS MINERALS) 600-800 MG-UNIT TABS Take 1 tablet by mouth daily.    Yes [provider]  ibuprofen (ADVIL,MOTRIN) 200 MG tablet Take 200 mg by mouth every 6 (six) hours as needed.   Yes [provider]  latanoprost (XALATAN) 0.005 % ophthalmic solution Place 1 drop into both eyes at bedtime.    Yes [provider]  levothyroxine (SYNTHROID, LEVOTHROID) 50 MCG tablet Take 50 mcg  by mouth daily before breakfast.    Yes [provider]  omeprazole (PRILOSEC OTC) 20 MG tablet Take 20 mg by mouth daily.   Yes [provider]  predniSONE (DELTASONE) 20 MG tablet Take 20-60 mg by mouth daily. Trapper 3-3-2-2-1-1 09/22/18  Yes [provider]  traMADol (ULTRAM) 50 MG tablet Take 50 mg by mouth every 6 (six) hours as needed for moderate pain.  09/22/18  Yes [provider]    Physical Exam: Vitals:   09/24/18 1800 09/24/18 1900 09/24/18 2030 09/24/18 2254  BP: (!) 147/68 135/72 134/72 128/64  Pulse: (!) 53 (!) 57 (!) 50 (!) 51  Resp:  13 12 14 16   Temp:      TempSrc:      SpO2: 98% 91% 96% 95%  Weight:      Height:        Physical Exam  Constitutional: She is oriented to person, place, and time. She appears well-developed and well-nourished. No distress.  HENT:  Head: Normocephalic.  Mouth/Throat: Oropharynx is clear and moist.  Eyes: Right eye exhibits no discharge. Left eye exhibits no discharge.  Neck: Neck supple.  Cardiovascular: Normal rate, regular rhythm and intact distal pulses.  Pulmonary/Chest: Effort normal and breath sounds normal. No respiratory distress. She has no wheezes. She has no rales.  Abdominal: Soft. Bowel sounds are normal. She exhibits no distension. There is no abdominal tenderness. There is no guarding.  Musculoskeletal:        General: No edema.  Neurological: She is alert and oriented to person, place, and time.  Speech fluent, tongue midline, no facial droop. Strength 5 out of 5 in bilateral upper and lower extremities.  Sensation to light touch intact throughout. Decreased range of motion of left hip on active flexion secondary to pain.  Skin: Skin is warm and dry. She is not diaphoretic.  Psychiatric: She has a normal mood and affect. Her behavior is normal.     Labs on Admission: I have personally reviewed following labs and imaging studies  CBC: Recent Labs  Lab 09/24/18 1517  WBC 10.7*  NEUTROABS 9.9*  HGB 13.4  HCT 40.4  MCV 100.7*  PLT 341   Basic Metabolic Panel: Recent Labs  Lab 09/24/18 1517  NA 139  K 4.1  CL 105  CO2 24  GLUCOSE 111*  BUN 27*  CREATININE 0.64  CALCIUM 8.9   GFR: Estimated Creatinine Clearance: 37.9 mL/min (by C-G formula based on SCr of 0.64 mg/dL). Liver Function Tests: Recent Labs  Lab 09/24/18 1517  AST 26  ALT 19  ALKPHOS 55  BILITOT 0.9  PROT 7.0  ALBUMIN 4.1   No results for input(s): LIPASE, AMYLASE in the last 168 hours. No results for input(s): AMMONIA in the last 168 hours. Coagulation Profile: No results  for input(s): INR, PROTIME in the last 168 hours. Cardiac Enzymes: No results for input(s): CKTOTAL, CKMB, CKMBINDEX, TROPONINI in the last 168 hours. BNP (last 3 results) No results for input(s): PROBNP in the last 8760 hours. HbA1C: No results for input(s): HGBA1C in the last 72 hours. CBG: No results for input(s): GLUCAP in the last 168 hours. Lipid Profile: No results for input(s): CHOL, HDL, LDLCALC, TRIG, CHOLHDL, LDLDIRECT in the last 72 hours. Thyroid Function Tests: No results for input(s): TSH, T4TOTAL, FREET4, T3FREE, THYROIDAB in the last 72 hours. Anemia Panel: No results for input(s): VITAMINB12, FOLATE, FERRITIN, TIBC, IRON, RETICCTPCT in the last 72 hours. Urine analysis:    Component  Value Date/Time   COLORURINE YELLOW 09/24/2018 1529   APPEARANCEUR CLEAR 09/24/2018 1529   LABSPEC 1.012 09/24/2018 1529   PHURINE 6.0 09/24/2018 1529   GLUCOSEU NEGATIVE 09/24/2018 1529   HGBUR NEGATIVE 09/24/2018 1529   BILIRUBINUR NEGATIVE 09/24/2018 1529   KETONESUR NEGATIVE 09/24/2018 1529   PROTEINUR NEGATIVE 09/24/2018 1529   UROBILINOGEN 0.2 03/05/2013 1143   NITRITE NEGATIVE 09/24/2018 1529   LEUKOCYTESUR NEGATIVE 09/24/2018 1529    Radiological Exams on Admission: Ct Abdomen Pelvis W Contrast  Result Date: 09/24/2018 CLINICAL DATA:  Acute right flank pain. History of endometrial cancer. EXAM: CT ABDOMEN AND PELVIS WITH CONTRAST TECHNIQUE: Multidetector CT imaging of the abdomen and pelvis was performed using the standard protocol following bolus administration of intravenous contrast. CONTRAST:  112mL ISOVUE-300 IOPAMIDOL (ISOVUE-300) INJECTION 61% COMPARISON:  PET scan of July 22, 2017. FINDINGS: Lower chest: Stable chronic interstitial densities are noted. Large hiatal hernia is noted. Hepatobiliary: No focal liver abnormality is seen. No gallstones, gallbladder wall thickening, or biliary dilatation. Pancreas: Unremarkable. No pancreatic ductal dilatation or surrounding  inflammatory changes. Spleen: Normal in size without focal abnormality. Adrenals/Urinary Tract: Adrenal glands appear normal. Parapelvic cysts are seen in the left kidney. No hydronephrosis or renal obstruction is noted. Urinary bladder is unremarkable. No renal or ureteral calculi are noted. Stomach/Bowel: The stomach appears normal. There is no evidence of bowel obstruction or inflammation. The appendix is not visualized, but no inflammation is noted in the right lower quadrant. Vascular/Lymphatic: Aortic atherosclerosis. No enlarged abdominal or pelvic lymph nodes. Reproductive: Status post hysterectomy. No adnexal masses. Other: No abdominal wall hernia or abnormality. No abdominopelvic ascites. Musculoskeletal: No acute or significant osseous findings. IMPRESSION: Large sliding-type hiatal hernia. No acute abnormality seen in the abdomen or pelvis. Aortic Atherosclerosis (ICD10-I70.0). Electronically Signed   By: Marijo Conception, M.D.   On: 09/24/2018 18:23   Dg Hip Unilat  With Pelvis 2-3 Views Right  Result Date: 09/24/2018 CLINICAL DATA:  Acute bilateral hip pain without known injury. EXAM: DG HIP (WITH OR WITHOUT PELVIS) 2-3V RIGHT COMPARISON:  None. FINDINGS: There is no evidence of hip fracture or dislocation. No significant joint space narrowing is noted. Sclerosis is seen involving superior portion of both femoral heads concerning for possible avascular necrosis. IMPRESSION: No fracture or dislocation is noted. Sclerosis noted in superior portion of both femoral heads concerning for possible avascular necrosis. Electronically Signed   By: Marijo Conception, M.D.   On: 09/24/2018 17:06    Assessment/Plan Principal Problem:   Hip pain, bilateral Active Problems:   GERD with stricture   Hernia, hiatal   Low back pain   Leukocytosis   Bilateral hip pain, low back pain -Likely musculoskeletal in nature. -X-ray with evidence of sclerosis in the superior portion of both femoral heads concerning  for possible avascular necrosis.  However, CT abdomen pelvis with no acute or significant osseous findings. -Per ED provider, patient was able to walk in the ED, however, appeared unsteady after receiving Norco and Zanaflex. -Continue Norco 5-325 mg 1 tablet every 6 hours as needed. -Continue lidocaine patch -Tylenol PRN -Hold Zanaflex at this time -Hold home prednisone and tramadol as they did not help alleviate patient's pain -Fall precautions -PT evaluation  Mild leukocytosis Likely reactive in the setting of home prednisone use.  White count 10.7.  Patient is afebrile.  UA not suggestive of infection.  Lungs clear on exam.  Patient is nontoxic-appearing. -Repeat CBC in a.m.  Reactive airway disease -Stable.  No  bronchospasm.  Continue albuterol as needed.  Hypothyroidism -Continue Synthroid -Check TSH level  GERD -Continue PPI  Bradycardia Heart rate in the 50s.  Patient is not on a beta-blocker at home.  Currently asymptomatic. -Check TSH level -EKG  Hiatal hernia CT abdomen pelvis showing large sliding-type pedal hernia.  Patient not complaining of abdominal pain or GERD symptoms. Abdominal exam benign. -Outpatient follow-up  DVT prophylaxis: Lovenox Code Status: Patient wishes to be full code Family Communication: Daughter at bedside Disposition Plan: Anticipate discharge in 1 to 2 days. Consults called: None Admission status: Observation   Shela Leff MD [facility] Pager [phone]  If 7PM-7AM, please contact night-coverage [email or web address] Password [email or web address]  09/24/2018, 11:30 PM

## 2018-09-24 NOTE — ED Notes (Signed)
Pt ambulated with minimal assist, tolerating well. Pt stated she felt unsteady and needing a little assistance because of the pain. Pt denied dizziness.

## 2018-09-24 NOTE — ED Triage Notes (Signed)
Patient comes from home. Patient AOx4 and ambulatory at baseline however not currently. Patient called 911 due to worsening bilateral hip pain. Patient was seen Monday for complaint, patient was prescribed Prednisone and Tramadol however patient has had no relief. No deformities noted.  Patient stated no scans were conducted on Monday.

## 2018-09-24 NOTE — ED Notes (Signed)
When asked about pain in lower back/hips/legs, patient states that she is only in pain when she moves.

## 2018-09-24 NOTE — ED Notes (Signed)
Bed: WA07 Expected date: 09/24/18 Expected time:  Means of arrival:  Comments: 85 year old hip pain

## 2018-09-24 NOTE — ED Notes (Addendum)
Pt in a lot of pain, will ambulate pt once pain meds have been given.

## 2018-09-25 DIAGNOSIS — M25552 Pain in left hip: Secondary | ICD-10-CM | POA: Diagnosis not present

## 2018-09-25 DIAGNOSIS — M25551 Pain in right hip: Secondary | ICD-10-CM | POA: Diagnosis not present

## 2018-09-25 LAB — CBC
HCT: 40.1 % (ref 36.0–46.0)
Hemoglobin: 12.8 g/dL (ref 12.0–15.0)
MCH: 33 pg (ref 26.0–34.0)
MCHC: 31.9 g/dL (ref 30.0–36.0)
MCV: 103.4 fL — ABNORMAL HIGH (ref 80.0–100.0)
Platelets: 160 10*3/uL (ref 150–400)
RBC: 3.88 MIL/uL (ref 3.87–5.11)
RDW: 13.1 % (ref 11.5–15.5)
WBC: 6.6 10*3/uL (ref 4.0–10.5)
nRBC: 0 % (ref 0.0–0.2)

## 2018-09-25 LAB — TSH: TSH: 9.587 u[IU]/mL — ABNORMAL HIGH (ref 0.350–4.500)

## 2018-09-25 MED ORDER — ACETAMINOPHEN 325 MG PO TABS
650.0000 mg | ORAL_TABLET | Freq: Four times a day (QID) | ORAL | Status: DC | PRN
  Administered 2018-09-25: 650 mg via ORAL
  Filled 2018-09-25: qty 2

## 2018-09-25 MED ORDER — POLYETHYLENE GLYCOL 3350 17 G PO PACK
17.0000 g | PACK | Freq: Every day | ORAL | Status: DC | PRN
  Filled 2018-09-25: qty 1

## 2018-09-25 MED ORDER — SENNOSIDES-DOCUSATE SODIUM 8.6-50 MG PO TABS
2.0000 | ORAL_TABLET | Freq: Every evening | ORAL | Status: DC | PRN
  Administered 2018-09-26: 2 via ORAL
  Filled 2018-09-25: qty 2

## 2018-09-25 MED ORDER — TIZANIDINE HCL 4 MG PO TABS
2.0000 mg | ORAL_TABLET | Freq: Four times a day (QID) | ORAL | Status: DC | PRN
  Administered 2018-09-25: 2 mg via ORAL
  Filled 2018-09-25: qty 1

## 2018-09-25 NOTE — Evaluation (Signed)
Physical Therapy Evaluation Patient Details Name: Tanya Martinez MRN: [id number] DOB: [birth year] Today's Date: 09/25/2018   History of Present Illness  85 year old female with history of endometrial cancer, hypothyroidism, GERD came to the hospital with complaints of development of bilateral hip pain causing her to have ambulatory dysfunction.  Apparently she had this similar pain about a week ago and her primary care had prescribed her prednisone and tramadol without improvement in her pain therefore came to the hospital for evaluation.  CT of the pelvis was negative for any acute abnormality.  Clinical Impression  Pt admitted with above diagnosis. Pt currently with functional limitations due to the deficits listed below (see PT Problem List).  Limited eval d/t severe LLE and back pain; pt with +SLR on L although testing and any mobility difficult d/t pain. Pt is cooperative within limits of pain. Pt may benefit from MRI and/or neuro consult if pain does not improve. Will follow in acute setting. May need SNF, however if pain resolves, may be able to d/c with HHPT.  Pt will benefit from skilled PT to increase their independence and safety with mobility to allow discharge to the venue listed below.       Follow Up Recommendations SNF    Equipment Recommendations  Other (comment)(TBA)    Recommendations for Other Services       Precautions / Restrictions Precautions Precautions: Fall Restrictions Weight Bearing Restrictions: No      Mobility  Bed Mobility Overal bed mobility: Needs Assistance Bed Mobility: Rolling;Sidelying to Sit;Sit to Sidelying Rolling: Max assist;+2 for physical assistance Sidelying to sit: +2 for physical assistance;Max assist     Sit to sidelying: +2 for physical assistance;Max assist General bed mobility comments: multi-modal cues and assist to log roll and transition s/l to sit, unable to come to full sit   Transfers                 General  transfer comment: unable to d/t pain  Ambulation/Gait                Stairs            Wheelchair Mobility    Modified Rankin (Stroke Patients Only)       Balance Overall balance assessment: Needs assistance(denies falls)                                           Pertinent Vitals/Pain Pain Assessment: 0-10 Pain Score: 9  Pain Descriptors / Indicators: Discomfort;Grimacing;Guarding;Sore;Stabbing;Sharp Pain Intervention(s): Limited activity within patient's tolerance;Monitored during session;Premedicated before session;Repositioned    Home Living Family/patient expects to be discharged to:: Private residence Living Arrangements: Alone Available Help at Discharge: Family;Available PRN/intermittently Type of Home: House Home Access: Stairs to enter   Entrance Stairs-Number of Steps: 1 Home Layout: Two level Home Equipment: None Additional Comments: family moved bed downstairs    Prior Function Level of Independence: Independent               Hand Dominance        Extremity/Trunk Assessment   Upper Extremity Assessment Upper Extremity Assessment: Generalized weakness    Lower Extremity Assessment Lower Extremity Assessment: Generalized weakness;LLE deficits/detail;RLE deficits/detail RLE Deficits / Details: AAROM grossly WFL, unable to test d/t pain RLE Sensation: WNL LLE Deficits / Details: grossly 2+/5, testing limited by pain and muscle guarding with imposed movement; +SLR LLE Sensation:  WNL       Communication   Communication: No difficulties  Cognition Arousal/Alertness: Awake/alert Behavior During Therapy: WFL for tasks assessed/performed Overall Cognitive Status: Within Functional Limits for tasks assessed                                        General Comments      Exercises     Assessment/Plan    PT Assessment Patient needs continued PT services  PT Problem List Decreased  strength;Decreased range of motion;Decreased activity tolerance;Decreased mobility;Pain       PT Treatment Interventions DME instruction;Functional mobility training;Therapeutic activities;Therapeutic exercise;Patient/family education;Gait training    PT Goals (Current goals can be found in the Care Plan section)  Acute Rehab PT Goals Patient Stated Goal: less pain PT Goal Formulation: With patient/family Time For Goal Achievement: 10/02/18 Potential to Achieve Goals: Good    Frequency Min 3X/week   Barriers to discharge        Co-evaluation               AM-PAC PT "6 Clicks" Mobility  Outcome Measure Help needed turning from your back to your side while in a flat bed without using bedrails?: Total Help needed moving from lying on your back to sitting on the side of a flat bed without using bedrails?: Total Help needed moving to and from a bed to a chair (including a wheelchair)?: Total Help needed standing up from a chair using your arms (e.g., wheelchair or bedside chair)?: Total Help needed to walk in hospital room?: Total Help needed climbing 3-5 steps with a railing? : Total 6 Click Score: 6    End of Session   Activity Tolerance: Patient limited by pain Patient left: with call bell/phone within reach;with family/visitor present;with bed alarm set;in bed Nurse Communication: Mobility status PT Visit Diagnosis: Unsteadiness on feet (R26.81)    Time: 5366-4403 PT Time Calculation (min) (ACUTE ONLY): 29 min   Charges:   PT Evaluation $PT Eval Moderate Complexity: 1 Mod PT Treatments $Therapeutic Activity: 8-22 mins        Kenyon Ana, PT  Pager: [phone] Acute Rehab Dept [facility]): [phone]   09/25/2018   [facility] 09/25/2018, 4:35 PM

## 2018-09-25 NOTE — Progress Notes (Signed)
PROGRESS NOTE    Tanya Martinez  MGQ:[id number] DOB: May 23, 1923 DOA: 09/24/2018 PCP: Alroy Dust, L.Marlou Sa, MD   Brief Narrative:  85 year old female with history of endometrial cancer, hypothyroidism, GERD came to the hospital with complaints of development of bilateral hip pain causing her to have ambulatory dysfunction.  Apparently she had this similar pain about a week ago and her primary care had prescribed her prednisone and tramadol without improvement in her pain therefore came to the hospital for evaluation.  CT of the pelvis was negative for any acute abnormality.   Assessment & Plan:   Principal Problem:   Hip pain, bilateral Active Problems:   GERD with stricture   Hernia, hiatal   Low back pain   Leukocytosis  Bilateral hip pain causing ambulatory dysfunction -X-ray and CT of the pelvis-negative for any acute finding such as fractures.  Could be possible concern of avascular necrosis - PT/OT ordered -Pain control, bowel regimen, PPI.  Add oral muscle relaxer as well.  Avoiding aggressive IV narcotics given her age to prevent any complications. -Supportive care.  Hypothyroidism -Continue Synthroid.  TSH slightly elevated, will check T4.  GERD -PPI  Bradycardia, asymptomatic -Heart rate in 50s.  TSH slightly elevated.  Will check T4  Hiatal hernia noted on CT of the abdomen and pelvis - Asymptomatic, continue PPI.  Follow-up outpatient   DVT prophylaxis: Lovenox Code Status: Full code Family Communication: Daughter at bedside Disposition Plan: We will keep the patient in the hospital for better pain control and also get PT/OT evaluation.  Consultants:   None  Procedures:   None  Antimicrobials:   None   Subjective: Had an episode of extreme hip pain bilaterally this morning and received pain medication.  By the time I saw the patient her pain was better but still not resolved.  She is afraid of moving around.  She lives at home alone.  Review of  Systems Otherwise negative except as per HPI, including: General = no fevers, chills, dizziness, malaise, fatigue HEENT/EYES = negative for pain, redness, loss of vision, double vision, blurred vision, loss of hearing, sore throat, hoarseness, dysphagia Cardiovascular= negative for chest pain, palpitation, murmurs, lower extremity swelling Respiratory/lungs= negative for shortness of breath, cough, hemoptysis, wheezing, mucus production Gastrointestinal= negative for nausea, vomiting,, abdominal pain, melena, hematemesis Genitourinary= negative for Dysuria, Hematuria, Change in Urinary Frequency MSK = Negative for arthralgia, myalgias, Back Pain, Joint swelling  Neurology= Negative for headache, seizures, numbness, tingling  Psychiatry= Negative for anxiety, depression, suicidal and homocidal ideation Allergy/Immunology= Medication/Food allergy as listed  Skin= Negative for Rash, lesions, ulcers, itching   Objective: Vitals:   09/24/18 2030 09/24/18 2254 09/24/18 2337 09/25/18 0606  BP: 134/72 128/64 (!) 121/57 138/67  Pulse: (!) 50 (!) 51 (!) 41 (!) 50  Resp: 14 16 17 15   Temp:   98.1 F (36.7 C) 98.3 F (36.8 C)  TempSrc:   Oral Oral  SpO2: 96% 95% 95% 98%  Weight:   54.6 kg   Height:   5\' 4"  (1.626 m)    No intake or output data in the 24 hours ending 09/25/18 1158 Filed Weights   09/24/18 1408 09/24/18 2337  Weight: 55.6 kg 54.6 kg    Examination:  Constitutional: NAD, calm, comfortable, elderly frail. Eyes: PERRL, lids and conjunctivae normal ENMT: Mucous membranes are moist. Posterior pharynx clear of any exudate or lesions.Normal dentition.  Neck: normal, supple, no masses, no thyromegaly Respiratory: clear to auscultation bilaterally, no wheezing, no crackles. Normal respiratory effort.  No accessory muscle use.  Cardiovascular: Regular rate and rhythm, no murmurs / rubs / gallops. No extremity edema. 2+ pedal pulses. No carotid bruits.  Abdomen: no tenderness, no  masses palpated. No hepatosplenomegaly. Bowel sounds positive.  Musculoskeletal: Limited range of motion bilaterally due to fear of pain Skin: no rashes, lesions, ulcers. No induration Neurologic: CN 2-12 grossly intact. Sensation intact, DTR normal. Strength 4/5 in all 4.  Psychiatric: Normal judgment and insight. Alert and oriented x 3. Normal mood.    Data Reviewed:   CBC: Recent Labs  Lab 09/24/18 1517 09/25/18 0543  WBC 10.7* 6.6  NEUTROABS 9.9*  --   HGB 13.4 12.8  HCT 40.4 40.1  MCV 100.7* 103.4*  PLT 156 413   Basic Metabolic Panel: Recent Labs  Lab 09/24/18 1517  NA 139  K 4.1  CL 105  CO2 24  GLUCOSE 111*  BUN 27*  CREATININE 0.64  CALCIUM 8.9   GFR: Estimated Creatinine Clearance: 37.9 mL/min (by C-G formula based on SCr of 0.64 mg/dL). Liver Function Tests: Recent Labs  Lab 09/24/18 1517  AST 26  ALT 19  ALKPHOS 55  BILITOT 0.9  PROT 7.0  ALBUMIN 4.1   No results for input(s): LIPASE, AMYLASE in the last 168 hours. No results for input(s): AMMONIA in the last 168 hours. Coagulation Profile: No results for input(s): INR, PROTIME in the last 168 hours. Cardiac Enzymes: No results for input(s): CKTOTAL, CKMB, CKMBINDEX, TROPONINI in the last 168 hours. BNP (last 3 results) No results for input(s): PROBNP in the last 8760 hours. HbA1C: No results for input(s): HGBA1C in the last 72 hours. CBG: No results for input(s): GLUCAP in the last 168 hours. Lipid Profile: No results for input(s): CHOL, HDL, LDLCALC, TRIG, CHOLHDL, LDLDIRECT in the last 72 hours. Thyroid Function Tests: Recent Labs    09/25/18 0543  TSH 9.587*   Anemia Panel: No results for input(s): VITAMINB12, FOLATE, FERRITIN, TIBC, IRON, RETICCTPCT in the last 72 hours. Sepsis Labs: No results for input(s): PROCALCITON, LATICACIDVEN in the last 168 hours.  No results found for this or any previous visit (from the past 240 hour(s)).       Radiology Studies: Ct Abdomen  Pelvis W Contrast  Result Date: 09/24/2018 CLINICAL DATA:  Acute right flank pain. History of endometrial cancer. EXAM: CT ABDOMEN AND PELVIS WITH CONTRAST TECHNIQUE: Multidetector CT imaging of the abdomen and pelvis was performed using the standard protocol following bolus administration of intravenous contrast. CONTRAST:  165mL ISOVUE-300 IOPAMIDOL (ISOVUE-300) INJECTION 61% COMPARISON:  PET scan of July 22, 2017. FINDINGS: Lower chest: Stable chronic interstitial densities are noted. Large hiatal hernia is noted. Hepatobiliary: No focal liver abnormality is seen. No gallstones, gallbladder wall thickening, or biliary dilatation. Pancreas: Unremarkable. No pancreatic ductal dilatation or surrounding inflammatory changes. Spleen: Normal in size without focal abnormality. Adrenals/Urinary Tract: Adrenal glands appear normal. Parapelvic cysts are seen in the left kidney. No hydronephrosis or renal obstruction is noted. Urinary bladder is unremarkable. No renal or ureteral calculi are noted. Stomach/Bowel: The stomach appears normal. There is no evidence of bowel obstruction or inflammation. The appendix is not visualized, but no inflammation is noted in the right lower quadrant. Vascular/Lymphatic: Aortic atherosclerosis. No enlarged abdominal or pelvic lymph nodes. Reproductive: Status post hysterectomy. No adnexal masses. Other: No abdominal wall hernia or abnormality. No abdominopelvic ascites. Musculoskeletal: No acute or significant osseous findings. IMPRESSION: Large sliding-type hiatal hernia. No acute abnormality seen in the abdomen or pelvis. Aortic Atherosclerosis (ICD10-I70.0).  Electronically Signed   By: Marijo Conception, M.D.   On: 09/24/2018 18:23   Dg Hip Unilat  With Pelvis 2-3 Views Right  Result Date: 09/24/2018 CLINICAL DATA:  Acute bilateral hip pain without known injury. EXAM: DG HIP (WITH OR WITHOUT PELVIS) 2-3V RIGHT COMPARISON:  None. FINDINGS: There is no evidence of hip fracture or  dislocation. No significant joint space narrowing is noted. Sclerosis is seen involving superior portion of both femoral heads concerning for possible avascular necrosis. IMPRESSION: No fracture or dislocation is noted. Sclerosis noted in superior portion of both femoral heads concerning for possible avascular necrosis. Electronically Signed   By: Marijo Conception, M.D.   On: 09/24/2018 17:06        Scheduled Meds: . calcium-vitamin D  1 tablet Oral Daily  . enoxaparin (LOVENOX) injection  40 mg Subcutaneous Q24H  . latanoprost  1 drop Both Eyes QHS  . levothyroxine  50 mcg Oral QAC breakfast  . lidocaine  1 patch Transdermal Q24H  . pantoprazole  40 mg Oral Daily   Continuous Infusions:   LOS: 0 days   Time spent= 25 mins    Lindsi Bayliss Arsenio Loader, MD [facility]  If 7PM-7AM, please contact night-coverage [email or web address] 09/25/2018, 11:58 AM

## 2018-09-26 ENCOUNTER — Observation Stay (HOSPITAL_COMMUNITY): Payer: Medicare Other

## 2018-09-26 DIAGNOSIS — M8448XA Pathological fracture, other site, initial encounter for fracture: Secondary | ICD-10-CM | POA: Diagnosis present

## 2018-09-26 DIAGNOSIS — Z882 Allergy status to sulfonamides status: Secondary | ICD-10-CM | POA: Diagnosis not present

## 2018-09-26 DIAGNOSIS — R278 Other lack of coordination: Secondary | ICD-10-CM | POA: Diagnosis not present

## 2018-09-26 DIAGNOSIS — M160 Bilateral primary osteoarthritis of hip: Secondary | ICD-10-CM | POA: Diagnosis present

## 2018-09-26 DIAGNOSIS — Z8551 Personal history of malignant neoplasm of bladder: Secondary | ICD-10-CM | POA: Diagnosis not present

## 2018-09-26 DIAGNOSIS — M2550 Pain in unspecified joint: Secondary | ICD-10-CM | POA: Diagnosis not present

## 2018-09-26 DIAGNOSIS — Z823 Family history of stroke: Secondary | ICD-10-CM | POA: Diagnosis not present

## 2018-09-26 DIAGNOSIS — Z7952 Long term (current) use of systemic steroids: Secondary | ICD-10-CM | POA: Diagnosis not present

## 2018-09-26 DIAGNOSIS — K222 Esophageal obstruction: Secondary | ICD-10-CM | POA: Diagnosis present

## 2018-09-26 DIAGNOSIS — D72829 Elevated white blood cell count, unspecified: Secondary | ICD-10-CM | POA: Diagnosis present

## 2018-09-26 DIAGNOSIS — R41841 Cognitive communication deficit: Secondary | ICD-10-CM | POA: Diagnosis not present

## 2018-09-26 DIAGNOSIS — M6281 Muscle weakness (generalized): Secondary | ICD-10-CM | POA: Diagnosis not present

## 2018-09-26 DIAGNOSIS — Z79890 Hormone replacement therapy (postmenopausal): Secondary | ICD-10-CM | POA: Diagnosis not present

## 2018-09-26 DIAGNOSIS — Z91013 Allergy to seafood: Secondary | ICD-10-CM | POA: Diagnosis not present

## 2018-09-26 DIAGNOSIS — M25552 Pain in left hip: Secondary | ICD-10-CM | POA: Diagnosis not present

## 2018-09-26 DIAGNOSIS — Z90710 Acquired absence of both cervix and uterus: Secondary | ICD-10-CM | POA: Diagnosis not present

## 2018-09-26 DIAGNOSIS — E039 Hypothyroidism, unspecified: Secondary | ICD-10-CM | POA: Diagnosis present

## 2018-09-26 DIAGNOSIS — H4050X0 Glaucoma secondary to other eye disorders, unspecified eye, stage unspecified: Secondary | ICD-10-CM | POA: Diagnosis not present

## 2018-09-26 DIAGNOSIS — H409 Unspecified glaucoma: Secondary | ICD-10-CM | POA: Diagnosis present

## 2018-09-26 DIAGNOSIS — Z8544 Personal history of malignant neoplasm of other female genital organs: Secondary | ICD-10-CM | POA: Diagnosis not present

## 2018-09-26 DIAGNOSIS — Z7401 Bed confinement status: Secondary | ICD-10-CM | POA: Diagnosis not present

## 2018-09-26 DIAGNOSIS — M545 Low back pain: Secondary | ICD-10-CM | POA: Diagnosis not present

## 2018-09-26 DIAGNOSIS — K219 Gastro-esophageal reflux disease without esophagitis: Secondary | ICD-10-CM | POA: Diagnosis present

## 2018-09-26 DIAGNOSIS — Z923 Personal history of irradiation: Secondary | ICD-10-CM | POA: Diagnosis not present

## 2018-09-26 DIAGNOSIS — Z79899 Other long term (current) drug therapy: Secondary | ICD-10-CM | POA: Diagnosis not present

## 2018-09-26 DIAGNOSIS — S3210XD Unspecified fracture of sacrum, subsequent encounter for fracture with routine healing: Secondary | ICD-10-CM | POA: Diagnosis not present

## 2018-09-26 DIAGNOSIS — M25551 Pain in right hip: Secondary | ICD-10-CM | POA: Diagnosis not present

## 2018-09-26 DIAGNOSIS — R001 Bradycardia, unspecified: Secondary | ICD-10-CM | POA: Diagnosis present

## 2018-09-26 DIAGNOSIS — Z8249 Family history of ischemic heart disease and other diseases of the circulatory system: Secondary | ICD-10-CM | POA: Diagnosis not present

## 2018-09-26 DIAGNOSIS — S3210XA Unspecified fracture of sacrum, initial encounter for closed fracture: Secondary | ICD-10-CM | POA: Diagnosis not present

## 2018-09-26 DIAGNOSIS — Z888 Allergy status to other drugs, medicaments and biological substances status: Secondary | ICD-10-CM | POA: Diagnosis not present

## 2018-09-26 DIAGNOSIS — K5900 Constipation, unspecified: Secondary | ICD-10-CM | POA: Diagnosis present

## 2018-09-26 DIAGNOSIS — Z961 Presence of intraocular lens: Secondary | ICD-10-CM | POA: Diagnosis not present

## 2018-09-26 DIAGNOSIS — Z8719 Personal history of other diseases of the digestive system: Secondary | ICD-10-CM | POA: Diagnosis not present

## 2018-09-26 DIAGNOSIS — Z8542 Personal history of malignant neoplasm of other parts of uterus: Secondary | ICD-10-CM | POA: Diagnosis not present

## 2018-09-26 DIAGNOSIS — K449 Diaphragmatic hernia without obstruction or gangrene: Secondary | ICD-10-CM | POA: Diagnosis present

## 2018-09-26 LAB — T4, FREE: Free T4: 1.34 ng/dL (ref 0.82–1.77)

## 2018-09-26 MED ORDER — HYDROCODONE-ACETAMINOPHEN 10-325 MG PO TABS
1.0000 | ORAL_TABLET | Freq: Four times a day (QID) | ORAL | Status: DC | PRN
  Administered 2018-09-26 – 2018-09-28 (×6): 1 via ORAL
  Filled 2018-09-26 (×6): qty 1

## 2018-09-26 MED ORDER — MORPHINE SULFATE (PF) 2 MG/ML IV SOLN
1.0000 mg | INTRAVENOUS | Status: DC | PRN
  Administered 2018-09-26 (×2): 1 mg via INTRAVENOUS
  Filled 2018-09-26 (×2): qty 1

## 2018-09-26 MED ORDER — POLYETHYLENE GLYCOL 3350 17 G PO PACK: 17.0000 g | PACK | Freq: Every day | ORAL | Status: DC | PRN

## 2018-09-26 MED ORDER — SODIUM CHLORIDE 0.9 % IV BOLUS
500.0000 mL | Freq: Once | INTRAVENOUS | Status: AC
  Administered 2018-09-26: 500 mL via INTRAVENOUS

## 2018-09-26 NOTE — Progress Notes (Signed)
PT Cancellation Note  Patient Details Name: Tanya Martinez MRN: [id number] DOB: [birth year]   Cancelled Treatment:    Reason Eval/Treat Not Completed: Medical issues which prohibited therapy; MRI pending, may benefit from MRI of L-spine as well as L hip to assess for any spinal dysfunction. Will continue to follow but await results of MRI, pt pain too severe to progress mobility at this time   [facility] 09/26/2018, 11:48 AM

## 2018-09-26 NOTE — Progress Notes (Signed)
OT Cancellation Note  Patient Details Name: Camyla Camposano MRN: [id number] DOB: 1923/01/28   Cancelled Treatment:    Reason Eval/Treat Not Completed: Patient not medically ready  Medical issues which prohibited therapy; MRI pending, may benefit from MRI of L-spine as well as L hip to assess for any spinal dysfunction. Will continue to follow but await results of MRI  Kari Baars, [place] [phone] Office- [phone], Thereasa Parkin 09/26/2018, 4:42 PM

## 2018-09-26 NOTE — Progress Notes (Addendum)
PROGRESS NOTE    Tanya Martinez  RKY:[id number] DOB: Jul 22, 1923 DOA: 09/24/2018 PCP: Alroy Dust, L.Marlou Sa, MD   Brief Narrative:  85 year old female with history of endometrial cancer, hypothyroidism, GERD came to the hospital with complaints of development of bilateral hip pain causing her to have ambulatory dysfunction.  Apparently she had this similar pain about a week ago and her primary care had prescribed her prednisone and tramadol without improvement in her pain therefore came to the hospital for evaluation.  CT of the pelvis was negative for any acute abnormality.   Assessment & Plan:   Principal Problem:   Hip pain, bilateral Active Problems:   GERD with stricture   Hernia, hiatal   Low back pain   Leukocytosis  Bilateral hip pain causing ambulatory dysfunction, persistent -Still continues to have quite a bit of pain in her hips.  Pain is causing ambulatory dysfunction -X-ray and CT of the pelvis-negative for any acute finding such as fractures.  Could be possible concern of avascular necrosis - PT-recommend skilled nursing facility. -Increase Norco to 10 mg every 6 hours as needed.  Morphine IV added. -Aggressive bowel regimen as needed. -Supportive care. -We will get MRI of the left hip.  Pain is worse on the left side  Hypothyroidism -Continue Synthroid.  TSH slightly elevated, T4 within normal limits  GERD -PPI  Bradycardia, asymptomatic -Heart rate in 50s.  TSH slightly elevated but T4 within normal limits  Hiatal hernia noted on CT of the abdomen and pelvis - Asymptomatic, continue PPI.  Follow-up outpatient   DVT prophylaxis: Lovenox Code Status: Full code Family Communication: Not at bedside Disposition Plan: We need better pain control of her hips at this time.  Causing lots of ambulatory dysfunction  Consultants:   None  Procedures:   None  Antimicrobials:   None   Subjective: She still reporting of quite a bit of left hip pain especially with  movement.  She is afraid that her pain is going to come back especially with ambulation.  Review of Systems Otherwise negative except as per HPI, including: General = no fevers, chills, dizziness, malaise, fatigue HEENT/EYES = negative for pain, redness, loss of vision, double vision, blurred vision, loss of hearing, sore throat, hoarseness, dysphagia Cardiovascular= negative for chest pain, palpitation, murmurs, lower extremity swelling Respiratory/lungs= negative for shortness of breath, cough, hemoptysis, wheezing, mucus production Gastrointestinal= negative for nausea, vomiting,, abdominal pain, melena, hematemesis Genitourinary= negative for Dysuria, Hematuria, Change in Urinary Frequency MSK = Negative for arthralgia, myalgias, Back Pain, Joint swelling  Neurology= Negative for headache, seizures, numbness, tingling  Psychiatry= Negative for anxiety, depression, suicidal and homocidal ideation Allergy/Immunology= Medication/Food allergy as listed  Skin= Negative for Rash, lesions, ulcers, itching   Objective: Vitals:   09/25/18 1334 09/25/18 1919 09/25/18 2032 09/26/18 0538  BP: 94/71 (!) 126/56 (!) 104/46 128/80  Pulse: (!) 55 (!) 58 (!) 59 62  Resp: 14  12 19   Temp: (!) 97.5 F (36.4 C) 97.8 F (36.6 C) 98 F (36.7 C) 97.9 F (36.6 C)  TempSrc: Oral Oral Oral Oral  SpO2: 97% 99% 97% 100%  Weight:      Height:        Intake/Output Summary (Last 24 hours) at 09/26/2018 1024 Last data filed at 09/26/2018 0500 Gross per 24 hour  Intake -  Output 950 ml  Net -950 ml   Filed Weights   09/24/18 1408 09/24/18 2337  Weight: 55.6 kg 54.6 kg    Examination:  Constitutional: NAD, calm,  comfortable Eyes: PERRL, lids and conjunctivae normal ENMT: Mucous membranes are moist. Posterior pharynx clear of any exudate or lesions.Normal dentition.  Neck: normal, supple, no masses, no thyromegaly Respiratory: clear to auscultation bilaterally, no wheezing, no crackles. Normal  respiratory effort. No accessory muscle use.  Cardiovascular: Regular rate and rhythm, no murmurs / rubs / gallops. No extremity edema. 2+ pedal pulses. No carotid bruits.  Abdomen: no of the left lower extremity especially due to the pain Skin: no rashes, lesions, ulcers. No induration Neurologic: CN 2-12 grossly intact. Sensation intact, DTR normal. Strength 4/5 in all 4.  Psychiatric: Normal judgment and insight. Alert and oriented x 3. Normal mood.    Data Reviewed:   CBC: Recent Labs  Lab 09/24/18 1517 09/25/18 0543  WBC 10.7* 6.6  NEUTROABS 9.9*  --   HGB 13.4 12.8  HCT 40.4 40.1  MCV 100.7* 103.4*  PLT 156 814   Basic Metabolic Panel: Recent Labs  Lab 09/24/18 1517  NA 139  K 4.1  CL 105  CO2 24  GLUCOSE 111*  BUN 27*  CREATININE 0.64  CALCIUM 8.9   GFR: Estimated Creatinine Clearance: 37.9 mL/min (by C-G formula based on SCr of 0.64 mg/dL). Liver Function Tests: Recent Labs  Lab 09/24/18 1517  AST 26  ALT 19  ALKPHOS 55  BILITOT 0.9  PROT 7.0  ALBUMIN 4.1   No results for input(s): LIPASE, AMYLASE in the last 168 hours. No results for input(s): AMMONIA in the last 168 hours. Coagulation Profile: No results for input(s): INR, PROTIME in the last 168 hours. Cardiac Enzymes: No results for input(s): CKTOTAL, CKMB, CKMBINDEX, TROPONINI in the last 168 hours. BNP (last 3 results) No results for input(s): PROBNP in the last 8760 hours. HbA1C: No results for input(s): HGBA1C in the last 72 hours. CBG: No results for input(s): GLUCAP in the last 168 hours. Lipid Profile: No results for input(s): CHOL, HDL, LDLCALC, TRIG, CHOLHDL, LDLDIRECT in the last 72 hours. Thyroid Function Tests: Recent Labs    09/25/18 0543 09/26/18 0518  TSH 9.587*  --   FREET4  --  1.34   Anemia Panel: No results for input(s): VITAMINB12, FOLATE, FERRITIN, TIBC, IRON, RETICCTPCT in the last 72 hours. Sepsis Labs: No results for input(s): PROCALCITON, LATICACIDVEN in the  last 168 hours.  No results found for this or any previous visit (from the past 240 hour(s)).       Radiology Studies: Ct Abdomen Pelvis W Contrast  Result Date: 09/24/2018 CLINICAL DATA:  Acute right flank pain. History of endometrial cancer. EXAM: CT ABDOMEN AND PELVIS WITH CONTRAST TECHNIQUE: Multidetector CT imaging of the abdomen and pelvis was performed using the standard protocol following bolus administration of intravenous contrast. CONTRAST:  140mL ISOVUE-300 IOPAMIDOL (ISOVUE-300) INJECTION 61% COMPARISON:  PET scan of July 22, 2017. FINDINGS: Lower chest: Stable chronic interstitial densities are noted. Large hiatal hernia is noted. Hepatobiliary: No focal liver abnormality is seen. No gallstones, gallbladder wall thickening, or biliary dilatation. Pancreas: Unremarkable. No pancreatic ductal dilatation or surrounding inflammatory changes. Spleen: Normal in size without focal abnormality. Adrenals/Urinary Tract: Adrenal glands appear normal. Parapelvic cysts are seen in the left kidney. No hydronephrosis or renal obstruction is noted. Urinary bladder is unremarkable. No renal or ureteral calculi are noted. Stomach/Bowel: The stomach appears normal. There is no evidence of bowel obstruction or inflammation. The appendix is not visualized, but no inflammation is noted in the right lower quadrant. Vascular/Lymphatic: Aortic atherosclerosis. No enlarged abdominal or pelvic lymph nodes.  Reproductive: Status post hysterectomy. No adnexal masses. Other: No abdominal wall hernia or abnormality. No abdominopelvic ascites. Musculoskeletal: No acute or significant osseous findings. IMPRESSION: Large sliding-type hiatal hernia. No acute abnormality seen in the abdomen or pelvis. Aortic Atherosclerosis (ICD10-I70.0). Electronically Signed   By: Marijo Conception, M.D.   On: 09/24/2018 18:23   Dg Hip Unilat  With Pelvis 2-3 Views Right  Result Date: 09/24/2018 CLINICAL DATA:  Acute bilateral hip pain  without known injury. EXAM: DG HIP (WITH OR WITHOUT PELVIS) 2-3V RIGHT COMPARISON:  None. FINDINGS: There is no evidence of hip fracture or dislocation. No significant joint space narrowing is noted. Sclerosis is seen involving superior portion of both femoral heads concerning for possible avascular necrosis. IMPRESSION: No fracture or dislocation is noted. Sclerosis noted in superior portion of both femoral heads concerning for possible avascular necrosis. Electronically Signed   By: Marijo Conception, M.D.   On: 09/24/2018 17:06        Scheduled Meds: . calcium-vitamin D  1 tablet Oral Daily  . enoxaparin (LOVENOX) injection  40 mg Subcutaneous Q24H  . latanoprost  1 drop Both Eyes QHS  . levothyroxine  50 mcg Oral QAC breakfast  . lidocaine  1 patch Transdermal Q24H  . pantoprazole  40 mg Oral Daily   Continuous Infusions:   LOS: 0 days   Time spent= 27 mins    Ankit Arsenio Loader, MD [facility]  If 7PM-7AM, please contact night-coverage [email or web address] 09/26/2018, 10:24 AM

## 2018-09-26 NOTE — Clinical Social Work Note (Signed)
Clinical Social Work Assessment  Patient Details  Name: Tanya Martinez MRN: [id number] Date of Birth: 01/11/1923  Date of referral:  09/26/18               Reason for consult:  Facility Placement                Permission sought to share information with:  Family Supports Permission granted to share information::  Yes, Verbal Permission Granted  Name::     daughter nancy  Agency::     Relationship::     Contact Information:     Housing/Transportation Living arrangements for the past 2 months:  Single Family Home Source of Information:  Patient, Adult Children, Medical Team Patient Interpreter Needed:  None Criminal Activity/Legal Involvement Pertinent to Current Situation/Hospitalization:  No - Comment as needed Significant Relationships:  Adult Children Lives with:  Self Do you feel safe going back to the place where you live?  Yes Need for family participation in patient care:  Yes (Comment)(daughter)  Care giving concerns:  Pt admitted from home where she resides alone. Reports at baseline she is independent, recently moved her bed to ground level floor in home to decrease risk of climbing stairs but otherwise has been very functional at home. Reports last week she "had excruciating pain in her hips that spread around to her back" leading to her coming to ED. Admitted for work up/pain control under observation currently.   Social Worker assessment / plan:  CSW consulted to assist with SNF placement. Met with pt and daughter at bedside- both open to SNF for rehab at DC (understand medicare requirements for inpatient stay to consider covering rehab and would consider out of pocket pay for SNF if pt does not meet this). However, pt states she feels she could not participate in rehab with her current level of pain. Is in process of further work up and adjustment to pain regimen, asks CSW to follow up.   Employment status:  Retired Insurance information:  Medicare(BCBS supplement) PT  Recommendations:  Skilled Nursing Facility Information / Referral to community resources:  Skilled Nursing Facility  Patient/Family's Response to care:  Appreciative "everyone has been very nice but we feel like every day we start all over, frustrated"  Patient/Family's Understanding of and Emotional Response to Diagnosis, Current Treatment, and Prognosis:  Pt shows good understanding of treatment received thus far (describes medication regimen to CSW and was good historian of problem)  Emotional Assessment Appearance:  Appears stated age Attitude/Demeanor/Rapport:  Engaged Affect (typically observed):  Accepting, Calm, Pleasant Orientation:  Oriented to Self, Oriented to Place, Oriented to  Time, Oriented to Situation Alcohol / Substance use:  Not Applicable Psych involvement (Current and /or in the community):  No (Comment)  Discharge Needs  Concerns to be addressed:  Discharge Planning Concerns Readmission within the last 30 days:  No Current discharge risk:  Lives alone Barriers to Discharge:  Continued Medical Work up   Meghan R Stout, LCSW 09/26/2018, 10:52 AM [phone] 

## 2018-09-27 NOTE — Progress Notes (Signed)
Pt today reports pain is less severe and she was able to work with therapy. CSW discussed disposition with her and daughter and is seeking SNF admission for rehab (private pay or with Medicare 3-day [facility] 2 waiver if applicable facility accepts).  Sharren Bridge, MSW, LCSW Clinical Social Work 09/27/2018 [phone]

## 2018-09-27 NOTE — NC FL2 (Signed)
[place] LEVEL OF CARE SCREENING TOOL     IDENTIFICATION  Patient Name: Tanya Martinez Birthdate: [birth year] Sex: female Admission Date (Current Location): 09/24/2018  [facility] and [place] Number:  Herbalist and Address:  [facility],  [place] [address] [place]      Provider Number: [id number]  Attending Physician Name and Address:  Damita Lack, MD  Relative Name and Phone Number:       Current Level of Care: Hospital Recommended Level of Care: [place] Prior Approval Number:    Date Approved/Denied:   PASRR Number: [id number]  Discharge Plan: SNF    Current Diagnoses: Patient Active Problem List   Diagnosis Date Noted  . Hip pain, bilateral 09/24/2018  . Low back pain 09/24/2018  . Leukocytosis 09/24/2018  . Bladder cancer ([place]) 08/25/2017  . 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 Recurrent carcinoma of endometrium ([place]) 07/07/2017  . Status post intraocular lens implant 11/08/2014  . UTI (urinary tract infection) 03/05/2013  . History of detached retina repair 04/12/2012  . Secondary open-angle glaucoma 10/18/2011  . Diarrhea 05/25/2011  . Dysphagia, unspecified(787.20) 05/25/2011  . GERD with stricture 05/25/2011  . Hernia, hiatal 05/25/2011  . Special screening for malignant neoplasms, colon 05/25/2011    Orientation RESPIRATION BLADDER Height & Weight     Self, Time, Situation, Place  Normal Continent Weight: 120 lb 5.9 oz (54.6 kg) Height:  5\' 4"  (162.6 cm)  BEHAVIORAL SYMPTOMS/MOOD NEUROLOGICAL BOWEL NUTRITION STATUS      Continent Diet(regular)  AMBULATORY STATUS COMMUNICATION OF NEEDS Skin   Extensive Assist Verbally Normal                       Personal Care Assistance Level of Assistance  Bathing, Feeding, Dressing Bathing Assistance: Limited assistance Feeding assistance: Independent Dressing Assistance: Limited assistance     Functional  Limitations Info  Sight, Hearing, Speech Sight Info: Adequate Hearing Info: Adequate Speech Info: Adequate    SPECIAL CARE FACTORS FREQUENCY  PT (By licensed PT), OT (By licensed OT)     PT Frequency: 5x OT Frequency: 5x            Contractures Contractures Info: Not present    Additional Factors Info  Code Status, Allergies Code Status Info: full code Allergies Info: Nystatin-triamcinolone, Azithromycin, Shellfish Allergy, Sulfa Antibiotics           Current Medications (09/27/2018):  This is the current hospital active medication list Current Facility-Administered Medications  Medication Dose Route Frequency Provider Last Rate Last Dose  . acetaminophen (TYLENOL) tablet 650 mg  650 mg Oral Q6H PRN Shela Leff, MD   650 mg at 09/25/18 1021  . albuterol (PROVENTIL) (2.5 MG/3ML) 0.083% nebulizer solution 2.5 mg  2.5 mg Nebulization Q6H PRN Shela Leff, MD      . calcium-vitamin D (OSCAL WITH D) 500-200 MG-UNIT per tablet 1 tablet  1 tablet Oral Daily Shela Leff, MD   1 tablet at 09/27/18 1020  . enoxaparin (LOVENOX) injection 40 mg  40 mg Subcutaneous Q24H Shela Leff, MD   40 mg at 09/27/18 1020  . HYDROcodone-acetaminophen (NORCO) 10-325 MG per tablet 1 tablet  1 tablet Oral Q6H PRN Damita Lack, MD   1 tablet at 09/27/18 1136  . latanoprost (XALATAN) 0.005 % ophthalmic solution 1 drop  1 drop Both Eyes QHS Shela Leff, MD   1 drop at 09/26/18 2027  . levothyroxine (SYNTHROID,  LEVOTHROID) tablet 50 mcg  50 mcg Oral QAC breakfast Shela Leff, MD   50 mcg at 09/27/18 [phone]  . lidocaine (LIDODERM) 5 % 1 patch  1 patch Transdermal Q24H Shela Leff, MD   1 patch at 09/26/18 2012  . lip balm (CARMEX) ointment 1 application  1 application Topical PRN Shela Leff, MD   1 application at 11/65/79 0009  . morphine 2 MG/ML injection 1 mg  1 mg Intravenous Q3H PRN Damita Lack, MD   1 mg at 09/26/18 2035  . pantoprazole  (PROTONIX) EC tablet 40 mg  40 mg Oral Daily Shela Leff, MD   40 mg at 09/27/18 1020  . polyethylene glycol (MIRALAX / GLYCOLAX) packet 17 g  17 g Oral Daily PRN Amin, Ankit Chirag, MD      . senna-docusate (Senokot-S) tablet 2 tablet  2 tablet Oral QHS PRN Damita Lack, MD   2 tablet at 09/26/18 2027  . tiZANidine (ZANAFLEX) tablet 2 mg  2 mg Oral Q6H PRN Damita Lack, MD   2 mg at 09/25/18 1529     Discharge Medications: Please see discharge summary for a list of discharge medications.  Relevant Imaging Results:  Relevant Lab Results:   Additional Information SS# [id number]  Nila Nephew, LCSW

## 2018-09-27 NOTE — Progress Notes (Signed)
Physical Therapy Treatment Patient Details Name: Tanya Martinez MRN: [id number] DOB: [birth year] Today's Date: 09/27/2018    History of Present Illness 85 year old female with history of endometrial cancer, hypothyroidism, GERD came to the hospital with complaints of development of bilateral hip pain causing her to have ambulatory dysfunction.  Apparently she had this similar pain about a week ago and her primary care had prescribed her prednisone and tramadol without improvement in her pain therefore came to the hospital for evaluation.  CT of the pelvis was negative for any acute abnormality.    PT Comments    RN gave pain meds prior.  Pt required + 2 assist to get OOB and amb.  General transfer comment: from elevated bed with decreased WBing tolerance thru L LE due to pain and assist to control stand to sit. General transfer comment: from elevated bed with decreased WBing tolerance thru L LE due to pain and assist to control stand to sit. General Gait Details: very short shuffled steps with decreased WBing tolerance thru L LE due to pain at posterior buttock area.  Positioned in recliner with multiple pillows.   Pt lives home alone and will need ST Rehab at SNF prior to returning.   Follow Up Recommendations  SNF     Equipment Recommendations       Recommendations for Other Services       Precautions / Restrictions Precautions Precautions: Fall Restrictions Weight Bearing Restrictions: No Other Position/Activity Restrictions: wbat per Dr Reesa Chew 09/27/18    Mobility  Bed Mobility Overal bed mobility: Needs Assistance Bed Mobility: Supine to Sit     Supine to sit: Max assist;+2 for physical assistance;+2 for safety/equipment     General bed mobility comments: great difficulty due to pain despite premedicated before session.  Unable to static sit on L side due to pain.    Transfers Overall transfer level: Needs assistance Equipment used: Rolling walker (2 wheeled) Transfers:  Sit to/from Stand Sit to Stand: Mod assist;Min assist;+2 physical assistance;+2 safety/equipment         General transfer comment: from elevated bed with decreased WBing tolerance thru L LE due to pain and assist to control stand to sit.   Ambulation/Gait Ambulation/Gait assistance: Min assist;Mod assist;+2 physical assistance;+2 safety/equipment Gait Distance (Feet): 17 Feet Assistive device: Rolling walker (2 wheeled) Gait Pattern/deviations: Step-to pattern;Decreased stance time - left Gait velocity: decreased    General Gait Details: very short shuffled steps with decreased WBing tolerance thru L LE due to pain at posterior buttock area.     Stairs             Wheelchair Mobility    Modified Rankin (Stroke Patients Only)       Balance                                            Cognition Arousal/Alertness: Awake/alert Behavior During Therapy: WFL for tasks assessed/performed Overall Cognitive Status: Within Functional Limits for tasks assessed                                 General Comments: smart, indep lady      Exercises      General Comments        Pertinent Vitals/Pain Pain Assessment: 0-10 Pain Score: 9  Pain Location: L posterior hip and  buttock area Pain Descriptors / Indicators: Discomfort;Grimacing;Guarding;Sore;Stabbing;Sharp Pain Intervention(s): Monitored during session;Repositioned;Relaxation    Home Living                      Prior Function            PT Goals (current goals can now be found in the care plan section) Progress towards PT goals: Progressing toward goals    Frequency    Min 3X/week      PT Plan Current plan remains appropriate    Co-evaluation              AM-PAC PT "6 Clicks" Mobility   Outcome Measure  Help needed turning from your back to your side while in a flat bed without using bedrails?: Total Help needed moving from lying on your back to  sitting on the side of a flat bed without using bedrails?: Total Help needed moving to and from a bed to a chair (including a wheelchair)?: Total Help needed standing up from a chair using your arms (e.g., wheelchair or bedside chair)?: Total Help needed to walk in hospital room?: Total Help needed climbing 3-5 steps with a railing? : Total 6 Click Score: 6    End of Session Equipment Utilized During Treatment: Gait belt Activity Tolerance: Patient limited by pain Patient left: with call bell/phone within reach;with family/visitor present;with bed alarm set;in chair Nurse Communication: Mobility status PT Visit Diagnosis: Unsteadiness on feet (R26.81)     Time: 5051-8335 PT Time Calculation (min) (ACUTE ONLY): 26 min  Charges:  $Gait Training: 8-22 mins $Therapeutic Activity: 8-22 mins                     Rica Koyanagi  PTA Acute  Rehabilitation Services Pager      [phone] Office      [phone]

## 2018-09-27 NOTE — Progress Notes (Signed)
PROGRESS NOTE    Tanya Martinez  XTG:[id number] DOB: Jan 08, 1923 DOA: 09/24/2018 PCP: Alroy Dust, L.Marlou Sa, MD   Brief Narrative:  85 year old female with history of endometrial cancer, hypothyroidism, GERD came to the hospital with complaints of development of bilateral hip pain causing her to have ambulatory dysfunction.  Apparently she had this similar pain about a week ago and her primary care had prescribed her prednisone and tramadol without improvement in her pain therefore came to the hospital for evaluation.  CT of the pelvis was negative for any acute abnormality.  MRI of the hip shows sacral insufficiency fracture.  Patient has previously had radiation to this area due to endometrial malignancy.  There is no obvious evidence of metastatic disease in this area.   Assessment & Plan:   Principal Problem:   Hip pain, bilateral Active Problems:   GERD with stricture   Hernia, hiatal   Low back pain   Leukocytosis  Bilateral hip pain causing ambulatory dysfunction, persistent -Pain was causing ambulatory dysfunction.  Doing better with Norco 10 mg every 6 hours.  IV morphine as needed.  Bowel regimen PRN.  CT of the pelvis-negative for acute findings but MRI of the hip shows sacral insufficiency fracture also on the left hip.  But no obvious evidence of metastatic disease.  Fracture appears to be nondisplaced. -We will continue supportive care.  PT OT recommending skilled nursing facility therefore social worker to transition the patient when the bed is available.  Family prefers [facility].  Hypothyroidism -Continue Synthroid.  TSH slightly elevated, T4 within normal limits  GERD -PPI  Bradycardia, asymptomatic -Heart rate in 50s.  TSH slightly elevated but T4 within normal limits  Hiatal hernia noted on CT of the abdomen and pelvis - Asymptomatic, continue PPI.  Follow-up outpatient   DVT prophylaxis: Lovenox Code Status: Full code Family Communication: Daughter at the  bedside Disposition Plan: At this point we are working on better pain control and having patient placed at a skilled nursing facility.  She can be discharged when she has a place available  Consultants:   None  Procedures:   None  Antimicrobials:   None   Subjective: Patient states her pain is much better controlled on Norco 10 mg every 6 hours.  She is able to move around a little bit in her bed.  Was not able to participate much in physical therapy yesterday due to the pain.  Review of Systems Otherwise negative except as per HPI, including: General = no fevers, chills, dizziness, malaise, fatigue HEENT/EYES = negative for pain, redness, loss of vision, double vision, blurred vision, loss of hearing, sore throat, hoarseness, dysphagia Cardiovascular= negative for chest pain, palpitation, murmurs, lower extremity swelling Respiratory/lungs= negative for shortness of breath, cough, hemoptysis, wheezing, mucus production Gastrointestinal= negative for nausea, vomiting,, abdominal pain, melena, hematemesis Genitourinary= negative for Dysuria, Hematuria, Change in Urinary Frequency MSK = Negative for arthralgia, myalgias, Back Pain, Joint swelling  Neurology= Negative for headache, seizures, numbness, tingling  Psychiatry= Negative for anxiety, depression, suicidal and homocidal ideation Allergy/Immunology= Medication/Food allergy as listed  Skin= Negative for Rash, lesions, ulcers, itching    Objective: Vitals:   09/26/18 0538 09/26/18 1528 09/26/18 2007 09/27/18 0515  BP: 128/80 (!) 87/51 (!) 105/58 (!) 128/49  Pulse: 62 66 64 66  Resp: 19 17 15 15   Temp: 97.9 F (36.6 C) 98.4 F (36.9 C) 99.3 F (37.4 C) 98.1 F (36.7 C)  TempSrc: Oral Oral Oral Oral  SpO2: 100% 96% 93% 95%  Weight:      Height:        Intake/Output Summary (Last 24 hours) at 09/27/2018 1025 Last data filed at 09/27/2018 0630 Gross per 24 hour  Intake 899.14 ml  Output 350 ml  Net 549.14 ml    Filed Weights   09/24/18 1408 09/24/18 2337  Weight: 55.6 kg 54.6 kg    Examination:  Constitutional: NAD, calm, comfortable Eyes: PERRL, lids and conjunctivae normal ENMT: Mucous membranes are moist. Posterior pharynx clear of any exudate or lesions.Normal dentition.  Neck: normal, supple, no masses, no thyromegaly Respiratory: clear to auscultation bilaterally, no wheezing, no crackles. Normal respiratory effort. No accessory muscle use.  Cardiovascular: Regular rate and rhythm, no murmurs / rubs / gallops. No extremity edema. 2+ pedal pulses. No carotid bruits.  Abdomen: no tenderness, no masses palpated. No hepatosplenomegaly. Bowel sounds positive.  Musculoskeletal: no clubbing / cyanosis. No joint deformity upper and lower extremities. Good ROM, no contractures. Normal muscle tone.  Skin: no rashes, lesions, ulcers. No induration Neurologic: CN 2-12 grossly intact. Sensation intact, DTR normal. Strength 4/5 in all 4.  Psychiatric: Normal judgment and insight. Alert and oriented x 3. Normal mood.    Data Reviewed:   CBC: Recent Labs  Lab 09/24/18 1517 09/25/18 0543  WBC 10.7* 6.6  NEUTROABS 9.9*  --   HGB 13.4 12.8  HCT 40.4 40.1  MCV 100.7* 103.4*  PLT 156 657   Basic Metabolic Panel: Recent Labs  Lab 09/24/18 1517  NA 139  K 4.1  CL 105  CO2 24  GLUCOSE 111*  BUN 27*  CREATININE 0.64  CALCIUM 8.9   GFR: Estimated Creatinine Clearance: 37.9 mL/min (by C-G formula based on SCr of 0.64 mg/dL). Liver Function Tests: Recent Labs  Lab 09/24/18 1517  AST 26  ALT 19  ALKPHOS 55  BILITOT 0.9  PROT 7.0  ALBUMIN 4.1   No results for input(s): LIPASE, AMYLASE in the last 168 hours. No results for input(s): AMMONIA in the last 168 hours. Coagulation Profile: No results for input(s): INR, PROTIME in the last 168 hours. Cardiac Enzymes: No results for input(s): CKTOTAL, CKMB, CKMBINDEX, TROPONINI in the last 168 hours. BNP (last 3 results) No results for  input(s): PROBNP in the last 8760 hours. HbA1C: No results for input(s): HGBA1C in the last 72 hours. CBG: No results for input(s): GLUCAP in the last 168 hours. Lipid Profile: No results for input(s): CHOL, HDL, LDLCALC, TRIG, CHOLHDL, LDLDIRECT in the last 72 hours. Thyroid Function Tests: Recent Labs    09/25/18 0543 09/26/18 0518  TSH 9.587*  --   FREET4  --  1.34   Anemia Panel: No results for input(s): VITAMINB12, FOLATE, FERRITIN, TIBC, IRON, RETICCTPCT in the last 72 hours. Sepsis Labs: No results for input(s): PROCALCITON, LATICACIDVEN in the last 168 hours.  No results found for this or any previous visit (from the past 240 hour(s)).       Radiology Studies: Mr Hip Left Wo Contrast  Result Date: 09/26/2018 CLINICAL DATA:  Increasing back and bilateral hip pain, worse on the left. Evaluate for possible arthritis. Limited ability to walk over the last 2 weeks. History of endometrial cancer. EXAM: MR OF THE LEFT HIP WITHOUT CONTRAST TECHNIQUE: Multiplanar, multisequence MR imaging was performed. No intravenous contrast was administered. COMPARISON:  Abdominopelvic CT 09/24/2018.  PET-CT 07/22/2017. FINDINGS: Bones: There is patchy bone marrow edema within the sacrum bilaterally, corresponding with subtle insufficiency fractures on recent CT. There is also patchy T2 hyperintensity  medially in the left superior acetabulum, without definite corresponding abnormality on recent CT, also favored to reflect an insufficiency fracture. No evidence of proximal femur fracture, dislocation or femoral head avascular necrosis. The sacroiliac joints are intact. Articular cartilage and labrum Articular cartilage:  Mild left hip degenerative changes. Labrum: Bilateral labral degeneration without gross tear or significant paralabral cyst. Joint or bursal effusion Joint effusion: Small bilateral hip joint effusions. Bursae: A small amount of fluid is present in the iliopsoas bursa bilaterally.  Muscles and tendons Muscles and tendons: The visualized gluteus, hamstring and iliopsoas tendons appear normal. The piriformis muscles appear symmetric. Other findings Miscellaneous: There is asymmetric subcutaneous edema posterolaterally in the left buttocks. Previous hysterectomy. The visualized internal pelvic contents appear stable. There is a small amount of focal fluid medial to the right iliac vessels (coronal image 28/4) which is unchanged from recent CT and possibly within a small femoral hernia. There is no herniated bowel. IMPRESSION: 1. Nondisplaced bilateral sacral insufficiency fractures. 2. Less specific signal abnormality in the left superior acetabulum, also favored to reflect an insufficiency fracture. No findings to suggest metastatic disease. 3. Mild degenerative changes of both hips with small joint effusions and acetabular labral degeneration bilaterally. 4. Asymmetric edema in the subcutaneous fat of the left buttocks posterolaterally, possibly soft tissue contusion. Electronically Signed   By: Richardean Sale M.D.   On: 09/26/2018 15:35        Scheduled Meds: . calcium-vitamin D  1 tablet Oral Daily  . enoxaparin (LOVENOX) injection  40 mg Subcutaneous Q24H  . latanoprost  1 drop Both Eyes QHS  . levothyroxine  50 mcg Oral QAC breakfast  . lidocaine  1 patch Transdermal Q24H  . pantoprazole  40 mg Oral Daily   Continuous Infusions:   LOS: 1 day   Time spent= 27 mins    Kyeshia Zinn Arsenio Loader, MD [facility]  If 7PM-7AM, please contact night-coverage [email or web address] 09/27/2018, 10:25 AM

## 2018-09-27 NOTE — Evaluation (Signed)
Occupational Therapy Evaluation Patient Details Name: Tanya Martinez MRN: [id number] DOB: 12/05/1922 Today's Date: 09/27/2018    History of Present Illness 85 year old female with history of endometrial cancer, hypothyroidism, GERD came to the hospital with complaints of development of bilateral hip pain.  MRI shows bil nondisplaced sacral insufficiency fxs.   Clinical Impression   Pt was admitted for the above.  She was able to get OOB with PT earlier and wanted to stay up in chair. Agreeable to standing during OT assessment.  She needs up to max A for LB adls due to pain and she stood with min/mod A. Will follow in acute setting with min guard level goals. Pt lives alone and would benefit from SNF for rehab prior to returning home    Follow Up Recommendations  SNF    Equipment Recommendations  3 in 1 bedside commode    Recommendations for Other Services       Precautions / Restrictions Precautions Precautions: Fall Restrictions Weight Bearing Restrictions: No Other Position/Activity Restrictions: wbat per Dr Reesa Chew 09/27/18      Mobility Bed Mobility Overal bed mobility: Needs Assistance Bed Mobility: Supine to Sit     Supine to sit: Max assist;+2 for physical assistance;+2 for safety/equipment     General bed mobility comments: oob  Transfers Overall transfer level: Needs assistance Equipment used: Rolling walker (2 wheeled) Transfers: Sit to/from Stand Sit to Stand: Min assist;Mod assist         General transfer comment: assis to rise and stabilize    Balance                                           ADL either performed or assessed with clinical judgement   ADL Overall ADL's : Needs assistance/impaired Eating/Feeding: Independent   Grooming: Set up   Upper Body Bathing: Set up   Lower Body Bathing: Moderate assistance   Upper Body Dressing : Set up   Lower Body Dressing: Maximal assistance                 General ADL  Comments: only performed sit to stand; pt wanted to remain up in chair     Vision         Perception     Praxis      Pertinent Vitals/Pain Pain Assessment: 0-10 Pain Score: 6  Pain Location: L hip Pain Descriptors / Indicators: Discomfort Pain Intervention(s): Limited activity within patient's tolerance;Monitored during session;Repositioned;Premedicated before session     Hand Dominance     Extremity/Trunk Assessment Upper Extremity Assessment Upper Extremity Assessment: Overall WFL for tasks assessed           Communication Communication Communication: No difficulties   Cognition Arousal/Alertness: Awake/alert Behavior During Therapy: WFL for tasks assessed/performed Overall Cognitive Status: Within Functional Limits for tasks assessed                                 General Comments: smart, indep lady   General Comments       Exercises     Shoulder Instructions      Home Living Family/patient expects to be discharged to:: Skilled nursing facility Living Arrangements: Alone Available Help at Discharge: Family  Home Equipment: None          Prior Functioning/Environment Level of Independence: Independent                 OT Problem List: Decreased activity tolerance;Pain;Decreased knowledge of use of DME or AE      OT Treatment/Interventions: Self-care/ADL training;DME and/or AE instruction;Patient/family education;Therapeutic activities    OT Goals(Current goals can be found in the care plan section) Acute Rehab OT Goals Patient Stated Goal: less pain OT Goal Formulation: With patient Time For Goal Achievement: 10/11/18 Potential to Achieve Goals: Good ADL Goals Pt Will Perform Lower Body Bathing: sit to/from stand;with adaptive equipment;with min guard assist Pt Will Perform Lower Body Dressing: with min assist;with adaptive equipment;sit to/from stand Pt Will Transfer to Toilet: with min  guard assist;bedside commode;stand pivot transfer Pt Will Perform Toileting - Clothing Manipulation and hygiene: with min guard assist;with adaptive equipment;sit to/from stand Additional ADL Goal #1: pt will perform bed mobility with min A in preparation for adls  OT Frequency: Min 2X/week   Barriers to D/C:            Co-evaluation              AM-PAC OT "6 Clicks" Daily Activity     Outcome Measure Help from another person eating meals?: None Help from another person taking care of personal grooming?: A Little Help from another person toileting, which includes using toliet, bedpan, or urinal?: A Lot Help from another person bathing (including washing, rinsing, drying)?: A Lot Help from another person to put on and taking off regular upper body clothing?: A Little Help from another person to put on and taking off regular lower body clothing?: A Lot 6 Click Score: 16   End of Session    Activity Tolerance: Patient tolerated treatment well Patient left: in chair;with call bell/phone within reach  OT Visit Diagnosis: Unsteadiness on feet (R26.81);Muscle weakness (generalized) (M62.81)                Time: 9622-2979 OT Time Calculation (min): 17 min Charges:  OT General Charges $OT Visit: 1 Visit OT Evaluation $OT Eval Low Complexity: [place], OTR/L Acute Rehabilitation Services [phone] [facility] pager [phone] office 09/27/2018  [place] 09/27/2018, 2:57 PM

## 2018-09-28 DIAGNOSIS — D72829 Elevated white blood cell count, unspecified: Secondary | ICD-10-CM | POA: Diagnosis not present

## 2018-09-28 DIAGNOSIS — K449 Diaphragmatic hernia without obstruction or gangrene: Secondary | ICD-10-CM | POA: Diagnosis not present

## 2018-09-28 DIAGNOSIS — M25552 Pain in left hip: Secondary | ICD-10-CM | POA: Diagnosis not present

## 2018-09-28 DIAGNOSIS — Z961 Presence of intraocular lens: Secondary | ICD-10-CM | POA: Diagnosis not present

## 2018-09-28 DIAGNOSIS — Z923 Personal history of irradiation: Secondary | ICD-10-CM | POA: Diagnosis not present

## 2018-09-28 DIAGNOSIS — M25551 Pain in right hip: Secondary | ICD-10-CM | POA: Diagnosis not present

## 2018-09-28 DIAGNOSIS — M6281 Muscle weakness (generalized): Secondary | ICD-10-CM | POA: Diagnosis not present

## 2018-09-28 DIAGNOSIS — Z7401 Bed confinement status: Secondary | ICD-10-CM | POA: Diagnosis not present

## 2018-09-28 DIAGNOSIS — S3210XD Unspecified fracture of sacrum, subsequent encounter for fracture with routine healing: Secondary | ICD-10-CM | POA: Diagnosis not present

## 2018-09-28 DIAGNOSIS — Z8551 Personal history of malignant neoplasm of bladder: Secondary | ICD-10-CM | POA: Diagnosis not present

## 2018-09-28 DIAGNOSIS — R278 Other lack of coordination: Secondary | ICD-10-CM | POA: Diagnosis not present

## 2018-09-28 DIAGNOSIS — Z8542 Personal history of malignant neoplasm of other parts of uterus: Secondary | ICD-10-CM | POA: Diagnosis not present

## 2018-09-28 DIAGNOSIS — R41841 Cognitive communication deficit: Secondary | ICD-10-CM | POA: Diagnosis not present

## 2018-09-28 DIAGNOSIS — K222 Esophageal obstruction: Secondary | ICD-10-CM | POA: Diagnosis not present

## 2018-09-28 DIAGNOSIS — Z8544 Personal history of malignant neoplasm of other female genital organs: Secondary | ICD-10-CM | POA: Diagnosis not present

## 2018-09-28 DIAGNOSIS — M2550 Pain in unspecified joint: Secondary | ICD-10-CM | POA: Diagnosis not present

## 2018-09-28 DIAGNOSIS — E039 Hypothyroidism, unspecified: Secondary | ICD-10-CM | POA: Diagnosis not present

## 2018-09-28 DIAGNOSIS — H4050X0 Glaucoma secondary to other eye disorders, unspecified eye, stage unspecified: Secondary | ICD-10-CM | POA: Diagnosis not present

## 2018-09-28 DIAGNOSIS — R001 Bradycardia, unspecified: Secondary | ICD-10-CM | POA: Diagnosis not present

## 2018-09-28 DIAGNOSIS — K5900 Constipation, unspecified: Secondary | ICD-10-CM | POA: Diagnosis not present

## 2018-09-28 DIAGNOSIS — M545 Low back pain: Secondary | ICD-10-CM | POA: Diagnosis not present

## 2018-09-28 DIAGNOSIS — K219 Gastro-esophageal reflux disease without esophagitis: Secondary | ICD-10-CM | POA: Diagnosis not present

## 2018-09-28 LAB — CBC
HCT: 36.7 % (ref 36.0–46.0)
Hemoglobin: 11.9 g/dL — ABNORMAL LOW (ref 12.0–15.0)
MCH: 33 pg (ref 26.0–34.0)
MCHC: 32.4 g/dL (ref 30.0–36.0)
MCV: 101.7 fL — ABNORMAL HIGH (ref 80.0–100.0)
Platelets: 162 10*3/uL (ref 150–400)
RBC: 3.61 MIL/uL — ABNORMAL LOW (ref 3.87–5.11)
RDW: 12.8 % (ref 11.5–15.5)
WBC: 5.1 10*3/uL (ref 4.0–10.5)
nRBC: 0 % (ref 0.0–0.2)

## 2018-09-28 LAB — BASIC METABOLIC PANEL
Anion gap: 6 (ref 5–15)
BUN: 23 mg/dL (ref 8–23)
CO2: 25 mmol/L (ref 22–32)
Calcium: 8.2 mg/dL — ABNORMAL LOW (ref 8.9–10.3)
Chloride: 106 mmol/L (ref 98–111)
Creatinine, Ser: 0.68 mg/dL (ref 0.44–1.00)
GFR calc Af Amer: >60 mL/min (ref >60)
GFR calc non Af Amer: >60 mL/min (ref >60)
Glucose, Bld: 101 mg/dL — ABNORMAL HIGH (ref 70–99)
POTASSIUM: 3.9 mmol/L (ref 3.5–5.1)
Sodium: 137 mmol/L (ref 135–145)

## 2018-09-28 LAB — MAGNESIUM: MAGNESIUM: 1.9 mg/dL (ref 1.7–2.4)

## 2018-09-28 MED ORDER — POLYETHYLENE GLYCOL 3350 17 G PO PACK: 17.0000 g | PACK | Freq: Every day | ORAL | 0 refills | Status: DC | PRN

## 2018-09-28 MED ORDER — SENNOSIDES-DOCUSATE SODIUM 8.6-50 MG PO TABS: 2.0000 | ORAL_TABLET | Freq: Every evening | ORAL | Status: DC | PRN

## 2018-09-28 MED ORDER — MAGNESIUM OXIDE 400 (241.3 MG) MG PO TABS
800.0000 mg | ORAL_TABLET | Freq: Two times a day (BID) | ORAL | Status: DC
  Administered 2018-09-28: 800 mg via ORAL
  Filled 2018-09-28: qty 2

## 2018-09-28 MED ORDER — ACETAMINOPHEN 325 MG PO TABS: 650.0000 mg | ORAL_TABLET | Freq: Four times a day (QID) | ORAL | Status: DC | PRN

## 2018-09-28 MED ORDER — LIDOCAINE 5 % EX PTCH: 1.0000 | MEDICATED_PATCH | CUTANEOUS | 0 refills | Status: DC

## 2018-09-28 MED ORDER — LACTULOSE 10 GM/15ML PO SOLN
20.0000 g | ORAL | Status: AC
  Administered 2018-09-28: 20 g via ORAL
  Filled 2018-09-28: qty 30

## 2018-09-28 MED ORDER — TRAMADOL HCL 50 MG PO TABS: 50.0000 mg | ORAL_TABLET | Freq: Four times a day (QID) | ORAL | 0 refills | Status: AC | PRN

## 2018-09-28 NOTE — Discharge Summary (Signed)
Physician Discharge Summary  Daney Moor YKZ:[id number] DOB: 10-01-1922 DOA: 09/24/2018  PCP: Alroy Dust, L.Marlou Sa, MD  Admit date: 09/24/2018 Discharge date: 09/28/2018  Admitted From: Home Disposition: Skilled nursing facility  Recommendations for Outpatient Follow-up:  1. Follow up with PCP in 1-2 weeks 2. Please obtain BMP/CBC in one week:   Home Health: None Equipment/Devices none  Discharge Condition stable CODE STATUS: Full code Diet recommendation: Cardiac Brief/Interim Summary:85 year old female with history of endometrial cancer, hypothyroidism, GERD came to the hospital with complaints of development of bilateral hip pain causing her to have ambulatory dysfunction.  Apparently she had this similar pain about a week ago and her primary care had prescribed her prednisone and tramadol without improvement in her pain therefore came to the hospital for evaluation.  CT of the pelvis was negative for any acute abnormality.  MRI of the hip shows sacral insufficiency fracture.  Patient has previously had radiation to this area due to endometrial malignancy.  There is no obvious evidence of metastatic disease in this area.  Discharge Diagnoses:  Principal Problem:   Hip pain, bilateral Active Problems:   GERD with stricture   Hernia, hiatal   Low back pain   Leukocytosis  Bilateral hip pain causing ambulatory dysfunction, persistent -Pain was causing ambulatory dysfunction.  CT of the pelvis-negative for acute findings but MRI of the hip shows sacral insufficiency fracture also on the left hip.  But no obvious evidence of metastatic disease.  Fracture appears to be nondisplaced.  Physical therapy recommends skilled nursing facility for rehabilitation.  Hypothyroidism -Continue Synthroid.  TSH slightly elevated, T4 within normal limits  GERD -PPI  Bradycardia, asymptomatic -Heart rate in 50s.  TSH slightly elevated but T4 within normal limits  Hiatal hernia noted on CT of the  abdomen and pelvis - Asymptomatic, continue PPI.  Follow-up outpatient  Constipation please make sure patient takes stool softeners daily. Estimated body mass index is 20.66 kg/m as calculated from the following:   Height as of this encounter: 5\' 4"  (1.626 m).   Weight as of this encounter: 54.6 kg.  Discharge Instructions   Allergies as of 09/28/2018      Reactions   Nystatin-triamcinolone Other (See Comments)   Redness, burning in the external vaginal area, broke out all over bottom and vaginal external area.   Azithromycin    GI Upset (intolerance)   Shellfish Allergy Nausea Only   Sulfa Antibiotics Other (See Comments)   "Lips felt numb"      Medication List    STOP taking these medications   ibuprofen 200 MG tablet Commonly known as:  ADVIL,MOTRIN   predniSONE 20 MG tablet Commonly known as:  DELTASONE     TAKE these medications   acetaminophen 325 MG tablet Commonly known as:  TYLENOL Take 2 tablets (650 mg total) by mouth every 6 (six) hours as needed for mild pain.   albuterol 108 (90 Base) MCG/ACT inhaler Commonly known as:  PROVENTIL HFA;VENTOLIN HFA Inhale 1-2 puffs into the lungs every 4 (four) hours as needed for wheezing or shortness of breath.   CALTRATE 600+D PLUS MINERALS 600-800 MG-UNIT Tabs Take 1 tablet by mouth daily.   latanoprost 0.005 % ophthalmic solution Commonly known as:  XALATAN Place 1 drop into both eyes at bedtime.   levothyroxine 50 MCG tablet Commonly known as:  SYNTHROID, LEVOTHROID Take 50 mcg by mouth daily before breakfast.   lidocaine 5 % Commonly known as:  LIDODERM Place 1 patch onto the skin daily. Remove &  Discard patch within 12 hours or as directed by MD   omeprazole 20 MG tablet Commonly known as:  PRILOSEC OTC Take 20 mg by mouth daily.   polyethylene glycol packet Commonly known as:  MIRALAX / GLYCOLAX Take 17 g by mouth daily as needed for moderate constipation or severe constipation (use first).    senna-docusate 8.6-50 MG tablet Commonly known as:  Senokot-S Take 2 tablets by mouth at bedtime as needed for mild constipation or moderate constipation.   traMADol 50 MG tablet Commonly known as:  ULTRAM Take 50 mg by mouth every 6 (six) hours as needed for moderate pain.      Follow-up Information    Alroy Dust, L.Marlou Sa, MD Follow up.   Specialty:  Family Medicine Contact information: [address] [place] [address] [phone]          Allergies  Allergen Reactions  . Nystatin-Triamcinolone Other (See Comments)    Redness, burning in the external vaginal area, broke out all over bottom and vaginal external area.  . Azithromycin     GI Upset (intolerance)  . Shellfish Allergy Nausea Only  . Sulfa Antibiotics Other (See Comments)    "Lips felt numb"    Consultations:     Procedures/Studies: Ct Abdomen Pelvis W Contrast  Result Date: 09/24/2018 CLINICAL DATA:  Acute right flank pain. History of endometrial cancer. EXAM: CT ABDOMEN AND PELVIS WITH CONTRAST TECHNIQUE: Multidetector CT imaging of the abdomen and pelvis was performed using the standard protocol following bolus administration of intravenous contrast. CONTRAST:  171mL ISOVUE-300 IOPAMIDOL (ISOVUE-300) INJECTION 61% COMPARISON:  PET scan of July 22, 2017. FINDINGS: Lower chest: Stable chronic interstitial densities are noted. Large hiatal hernia is noted. Hepatobiliary: No focal liver abnormality is seen. No gallstones, gallbladder wall thickening, or biliary dilatation. Pancreas: Unremarkable. No pancreatic ductal dilatation or surrounding inflammatory changes. Spleen: Normal in size without focal abnormality. Adrenals/Urinary Tract: Adrenal glands appear normal. Parapelvic cysts are seen in the left kidney. No hydronephrosis or renal obstruction is noted. Urinary bladder is unremarkable. No renal or ureteral calculi are noted. Stomach/Bowel: The stomach appears normal. There is no evidence  of bowel obstruction or inflammation. The appendix is not visualized, but no inflammation is noted in the right lower quadrant. Vascular/Lymphatic: Aortic atherosclerosis. No enlarged abdominal or pelvic lymph nodes. Reproductive: Status post hysterectomy. No adnexal masses. Other: No abdominal wall hernia or abnormality. No abdominopelvic ascites. Musculoskeletal: No acute or significant osseous findings. IMPRESSION: Large sliding-type hiatal hernia. No acute abnormality seen in the abdomen or pelvis. Aortic Atherosclerosis (ICD10-I70.0). Electronically Signed   By: Marijo Conception, M.D.   On: 09/24/2018 18:23   Mr Hip Left Wo Contrast  Result Date: 09/26/2018 CLINICAL DATA:  Increasing back and bilateral hip pain, worse on the left. Evaluate for possible arthritis. Limited ability to walk over the last 2 weeks. History of endometrial cancer. EXAM: MR OF THE LEFT HIP WITHOUT CONTRAST TECHNIQUE: Multiplanar, multisequence MR imaging was performed. No intravenous contrast was administered. COMPARISON:  Abdominopelvic CT 09/24/2018.  PET-CT 07/22/2017. FINDINGS: Bones: There is patchy bone marrow edema within the sacrum bilaterally, corresponding with subtle insufficiency fractures on recent CT. There is also patchy T2 hyperintensity medially in the left superior acetabulum, without definite corresponding abnormality on recent CT, also favored to reflect an insufficiency fracture. No evidence of proximal femur fracture, dislocation or femoral head avascular necrosis. The sacroiliac joints are intact. Articular cartilage and labrum Articular cartilage:  Mild left hip degenerative changes. Labrum:  Bilateral labral degeneration without gross tear or significant paralabral cyst. Joint or bursal effusion Joint effusion: Small bilateral hip joint effusions. Bursae: A small amount of fluid is present in the iliopsoas bursa bilaterally. Muscles and tendons Muscles and tendons: The visualized gluteus, hamstring and  iliopsoas tendons appear normal. The piriformis muscles appear symmetric. Other findings Miscellaneous: There is asymmetric subcutaneous edema posterolaterally in the left buttocks. Previous hysterectomy. The visualized internal pelvic contents appear stable. There is a small amount of focal fluid medial to the right iliac vessels (coronal image 28/4) which is unchanged from recent CT and possibly within a small femoral hernia. There is no herniated bowel. IMPRESSION: 1. Nondisplaced bilateral sacral insufficiency fractures. 2. Less specific signal abnormality in the left superior acetabulum, also favored to reflect an insufficiency fracture. No findings to suggest metastatic disease. 3. Mild degenerative changes of both hips with small joint effusions and acetabular labral degeneration bilaterally. 4. Asymmetric edema in the subcutaneous fat of the left buttocks posterolaterally, possibly soft tissue contusion. Electronically Signed   By: Richardean Sale M.D.   On: 09/26/2018 15:35   Dg Hip Unilat  With Pelvis 2-3 Views Right  Result Date: 09/24/2018 CLINICAL DATA:  Acute bilateral hip pain without known injury. EXAM: DG HIP (WITH OR WITHOUT PELVIS) 2-3V RIGHT COMPARISON:  None. FINDINGS: There is no evidence of hip fracture or dislocation. No significant joint space narrowing is noted. Sclerosis is seen involving superior portion of both femoral heads concerning for possible avascular necrosis. IMPRESSION: No fracture or dislocation is noted. Sclerosis noted in superior portion of both femoral heads concerning for possible avascular necrosis. Electronically Signed   By: Marijo Conception, M.D.   On: 09/24/2018 17:06    (Echo, Carotid, EGD, Colonoscopy, ERCP)    Subjective:   Discharge Exam: Vitals:   09/27/18 1356 09/28/18 0500  BP: (!) 88/54 (!) 113/58  Pulse: 70 61  Resp: 16 16  Temp: 97.8 F (36.6 C) 98.3 F (36.8 C)  SpO2: 94% 93%   Vitals:   09/26/18 2007 09/27/18 0515 09/27/18 1356  09/28/18 0500  BP: (!) 105/58 (!) 128/49 (!) 88/54 (!) 113/58  Pulse: 64 66 70 61  Resp: 15 15 16 16   Temp: 99.3 F (37.4 C) 98.1 F (36.7 C) 97.8 F (36.6 C) 98.3 F (36.8 C)  TempSrc: Oral Oral Oral Oral  SpO2: 93% 95% 94% 93%  Weight:      Height:        General: Pt is alert, awake, not in acute distress Cardiovascular: RRR, S1/S2 +, no rubs, no gallops Respiratory: CTA bilaterally, no wheezing, no rhonchi Abdominal: Soft, NT, ND, bowel sounds + Extremities: no edema, no cyanosis    The results of significant diagnostics from this hospitalization (including imaging, microbiology, ancillary and laboratory) are listed below for reference.     Microbiology: No results found for this or any previous visit (from the past 240 hour(s)).   Labs: BNP (last 3 results) No results for input(s): BNP in the last 8760 hours. Basic Metabolic Panel: Recent Labs  Lab 09/24/18 1517 09/28/18 0339  NA 139 137  K 4.1 3.9  CL 105 106  CO2 24 25  GLUCOSE 111* 101*  BUN 27* 23  CREATININE 0.64 0.68  CALCIUM 8.9 8.2*  MG  --  1.9   Liver Function Tests: Recent Labs  Lab 09/24/18 1517  AST 26  ALT 19  ALKPHOS 55  BILITOT 0.9  PROT 7.0  ALBUMIN 4.1   No  results for input(s): LIPASE, AMYLASE in the last 168 hours. No results for input(s): AMMONIA in the last 168 hours. CBC: Recent Labs  Lab 09/24/18 1517 09/25/18 0543 09/28/18 0339  WBC 10.7* 6.6 5.1  NEUTROABS 9.9*  --   --   HGB 13.4 12.8 11.9*  HCT 40.4 40.1 36.7  MCV 100.7* 103.4* 101.7*  PLT 156 160 162   Cardiac Enzymes: No results for input(s): CKTOTAL, CKMB, CKMBINDEX, TROPONINI in the last 168 hours. BNP: Invalid input(s): POCBNP CBG: No results for input(s): GLUCAP in the last 168 hours. D-Dimer No results for input(s): DDIMER in the last 72 hours. Hgb A1c No results for input(s): HGBA1C in the last 72 hours. Lipid Profile No results for input(s): CHOL, HDL, LDLCALC, TRIG, CHOLHDL, LDLDIRECT in the  last 72 hours. Thyroid function studies No results for input(s): TSH, T4TOTAL, T3FREE, THYROIDAB in the last 72 hours.  Invalid input(s): FREET3 Anemia work up No results for input(s): VITAMINB12, FOLATE, FERRITIN, TIBC, IRON, RETICCTPCT in the last 72 hours. Urinalysis    Component Value Date/Time   COLORURINE YELLOW 09/24/2018 1529   APPEARANCEUR CLEAR 09/24/2018 1529   LABSPEC 1.012 09/24/2018 1529   PHURINE 6.0 09/24/2018 1529   GLUCOSEU NEGATIVE 09/24/2018 1529   HGBUR NEGATIVE 09/24/2018 1529   BILIRUBINUR NEGATIVE 09/24/2018 1529   KETONESUR NEGATIVE 09/24/2018 1529   PROTEINUR NEGATIVE 09/24/2018 1529   UROBILINOGEN 0.2 03/05/2013 1143   NITRITE NEGATIVE 09/24/2018 1529   LEUKOCYTESUR NEGATIVE 09/24/2018 1529   Sepsis Labs Invalid input(s): PROCALCITONIN,  WBC,  LACTICIDVEN Microbiology No results found for this or any previous visit (from the past 240 hour(s)).   Time coordinating discharge: 33 minutes  SIGNED:   Georgette Shell, MD  [facility] 09/28/2018, 11:16 AM Pager   If 7PM-7AM, please contact night-coverage [email or web address] Password [email or web address]

## 2018-09-28 NOTE — Clinical Social Work Placement (Signed)
Pt admitting to [facility] for rehab room 12A report [phone] Approved for [facility] Medicare waiver as pt has not had qualifying stay Daughter Tanya Martinez at bedside aware Will arrange transportation   [place]  NOTE  Date:  09/28/2018  Patient Details  Name: Tanya Martinez MRN: [id number] Date of Birth: 1923/05/25  Clinical Social Work is seeking post-discharge placement for this patient at the [place] level of care (*CSW will initial, date and re-position this form in  chart as items are completed):  Yes   Patient/family provided with [place] Work Department's list of facilities offering this level of care within the geographic area requested by the patient (or if unable, by the patient's family).  Yes   Patient/family informed of their freedom to choose among providers that offer the needed level of care, that participate in Medicare, Medicaid or managed care program needed by the patient, have an available bed and are willing to accept the patient.  Yes   Patient/family informed of [place]'s ownership interest in [facility] and [facility], as well as of the fact that they are under no obligation to receive care at these facilities.  PASRR submitted to EDS on 09/26/18     PASRR number received on 09/26/18     Existing PASRR number confirmed on       FL2 transmitted to all facilities in geographic area requested by pt/family on 09/27/18     FL2 transmitted to all facilities within larger geographic area on       Patient informed that his/her managed care company has contracts with or will negotiate with certain facilities, including the following:        Yes   Patient/family informed of bed offers received.  Patient chooses bed at [facility]     Physician recommends and patient chooses bed at [facility]    Patient to be transferred to [facility] on 09/28/18.  Patient to be  transferred to facility by [facility]     Patient family notified on 09/28/18 of transfer.  Name of family member notified:  daughter Tanya Martinez     PHYSICIAN       Additional Comment:    _______________________________________________ Nila Nephew, LCSW 09/28/2018, 11:37 AM [phone]

## 2018-09-28 NOTE — Progress Notes (Signed)
Pt. Discharged to [facility] left with [facility]  via stretcher. No respiratory distress noted. Daughter at bedside.

## 2018-09-28 NOTE — Progress Notes (Signed)
Report called to Journalist, newspaper at [employer].[facility] here for transport. Pt. alert and oriented x 4 no respiratory distress noted

## 2018-09-30 DIAGNOSIS — K219 Gastro-esophageal reflux disease without esophagitis: Secondary | ICD-10-CM | POA: Diagnosis not present

## 2018-09-30 DIAGNOSIS — E039 Hypothyroidism, unspecified: Secondary | ICD-10-CM | POA: Diagnosis not present

## 2018-09-30 DIAGNOSIS — S3210XD Unspecified fracture of sacrum, subsequent encounter for fracture with routine healing: Secondary | ICD-10-CM | POA: Diagnosis not present

## 2018-09-30 DIAGNOSIS — K5900 Constipation, unspecified: Secondary | ICD-10-CM | POA: Diagnosis not present

## 2018-10-05 DIAGNOSIS — K219 Gastro-esophageal reflux disease without esophagitis: Secondary | ICD-10-CM | POA: Diagnosis not present

## 2018-10-05 DIAGNOSIS — L89159 Pressure ulcer of sacral region, unspecified stage: Secondary | ICD-10-CM | POA: Diagnosis not present

## 2018-10-05 DIAGNOSIS — R062 Wheezing: Secondary | ICD-10-CM | POA: Diagnosis not present

## 2018-10-05 DIAGNOSIS — R001 Bradycardia, unspecified: Secondary | ICD-10-CM | POA: Diagnosis not present

## 2018-10-05 DIAGNOSIS — E039 Hypothyroidism, unspecified: Secondary | ICD-10-CM | POA: Diagnosis not present

## 2018-10-05 DIAGNOSIS — M7918 Myalgia, other site: Secondary | ICD-10-CM | POA: Diagnosis not present

## 2018-10-05 DIAGNOSIS — H4050X0 Glaucoma secondary to other eye disorders, unspecified eye, stage unspecified: Secondary | ICD-10-CM | POA: Diagnosis not present

## 2018-10-05 DIAGNOSIS — R21 Rash and other nonspecific skin eruption: Secondary | ICD-10-CM | POA: Diagnosis not present

## 2018-10-05 DIAGNOSIS — Z8544 Personal history of malignant neoplasm of other female genital organs: Secondary | ICD-10-CM | POA: Diagnosis not present

## 2018-10-05 DIAGNOSIS — M25552 Pain in left hip: Secondary | ICD-10-CM | POA: Diagnosis not present

## 2018-10-05 DIAGNOSIS — M4848XG Fatigue fracture of vertebra, sacral and sacrococcygeal region, subsequent encounter for fracture with delayed healing: Secondary | ICD-10-CM | POA: Diagnosis not present

## 2018-10-05 DIAGNOSIS — M25551 Pain in right hip: Secondary | ICD-10-CM | POA: Diagnosis not present

## 2018-10-05 DIAGNOSIS — R918 Other nonspecific abnormal finding of lung field: Secondary | ICD-10-CM | POA: Diagnosis not present

## 2018-10-05 DIAGNOSIS — R2689 Other abnormalities of gait and mobility: Secondary | ICD-10-CM | POA: Diagnosis not present

## 2018-10-05 DIAGNOSIS — K449 Diaphragmatic hernia without obstruction or gangrene: Secondary | ICD-10-CM | POA: Diagnosis not present

## 2018-10-05 DIAGNOSIS — M545 Low back pain: Secondary | ICD-10-CM | POA: Diagnosis not present

## 2018-10-05 DIAGNOSIS — S3210XD Unspecified fracture of sacrum, subsequent encounter for fracture with routine healing: Secondary | ICD-10-CM | POA: Diagnosis not present

## 2018-10-05 DIAGNOSIS — F419 Anxiety disorder, unspecified: Secondary | ICD-10-CM | POA: Diagnosis not present

## 2018-10-05 DIAGNOSIS — J189 Pneumonia, unspecified organism: Secondary | ICD-10-CM | POA: Diagnosis not present

## 2018-10-05 DIAGNOSIS — K5900 Constipation, unspecified: Secondary | ICD-10-CM | POA: Diagnosis not present

## 2018-10-05 DIAGNOSIS — M6281 Muscle weakness (generalized): Secondary | ICD-10-CM | POA: Diagnosis not present

## 2018-10-05 DIAGNOSIS — R278 Other lack of coordination: Secondary | ICD-10-CM | POA: Diagnosis not present

## 2018-10-07 DIAGNOSIS — R001 Bradycardia, unspecified: Secondary | ICD-10-CM | POA: Diagnosis not present

## 2018-10-07 DIAGNOSIS — K5900 Constipation, unspecified: Secondary | ICD-10-CM | POA: Diagnosis not present

## 2018-10-07 DIAGNOSIS — R21 Rash and other nonspecific skin eruption: Secondary | ICD-10-CM | POA: Diagnosis not present

## 2018-10-07 DIAGNOSIS — K219 Gastro-esophageal reflux disease without esophagitis: Secondary | ICD-10-CM | POA: Diagnosis not present

## 2018-10-07 DIAGNOSIS — S3210XD Unspecified fracture of sacrum, subsequent encounter for fracture with routine healing: Secondary | ICD-10-CM | POA: Diagnosis not present

## 2018-10-07 DIAGNOSIS — R062 Wheezing: Secondary | ICD-10-CM | POA: Diagnosis not present

## 2018-10-07 DIAGNOSIS — M6281 Muscle weakness (generalized): Secondary | ICD-10-CM | POA: Diagnosis not present

## 2018-10-07 DIAGNOSIS — K449 Diaphragmatic hernia without obstruction or gangrene: Secondary | ICD-10-CM | POA: Diagnosis not present

## 2018-10-07 DIAGNOSIS — H4050X0 Glaucoma secondary to other eye disorders, unspecified eye, stage unspecified: Secondary | ICD-10-CM | POA: Diagnosis not present

## 2018-10-07 DIAGNOSIS — E039 Hypothyroidism, unspecified: Secondary | ICD-10-CM | POA: Diagnosis not present

## 2018-10-10 ENCOUNTER — Ambulatory Visit: Payer: Medicare Other | Attending: Radiation Oncology | Admitting: Radiation Oncology

## 2018-10-10 DIAGNOSIS — K5900 Constipation, unspecified: Secondary | ICD-10-CM | POA: Diagnosis not present

## 2018-10-10 DIAGNOSIS — R062 Wheezing: Secondary | ICD-10-CM | POA: Diagnosis not present

## 2018-10-10 DIAGNOSIS — K219 Gastro-esophageal reflux disease without esophagitis: Secondary | ICD-10-CM | POA: Diagnosis not present

## 2018-10-10 DIAGNOSIS — R21 Rash and other nonspecific skin eruption: Secondary | ICD-10-CM | POA: Diagnosis not present

## 2018-10-10 DIAGNOSIS — R001 Bradycardia, unspecified: Secondary | ICD-10-CM | POA: Diagnosis not present

## 2018-10-10 DIAGNOSIS — M6281 Muscle weakness (generalized): Secondary | ICD-10-CM | POA: Diagnosis not present

## 2018-10-10 DIAGNOSIS — L89159 Pressure ulcer of sacral region, unspecified stage: Secondary | ICD-10-CM | POA: Diagnosis not present

## 2018-10-10 DIAGNOSIS — J189 Pneumonia, unspecified organism: Secondary | ICD-10-CM | POA: Diagnosis not present

## 2018-10-10 DIAGNOSIS — H4050X0 Glaucoma secondary to other eye disorders, unspecified eye, stage unspecified: Secondary | ICD-10-CM | POA: Diagnosis not present

## 2018-10-10 DIAGNOSIS — K449 Diaphragmatic hernia without obstruction or gangrene: Secondary | ICD-10-CM | POA: Diagnosis not present

## 2018-10-10 DIAGNOSIS — E039 Hypothyroidism, unspecified: Secondary | ICD-10-CM | POA: Diagnosis not present

## 2018-10-10 DIAGNOSIS — S3210XD Unspecified fracture of sacrum, subsequent encounter for fracture with routine healing: Secondary | ICD-10-CM | POA: Diagnosis not present

## 2018-10-11 DIAGNOSIS — M4848XG Fatigue fracture of vertebra, sacral and sacrococcygeal region, subsequent encounter for fracture with delayed healing: Secondary | ICD-10-CM | POA: Diagnosis not present

## 2018-10-11 DIAGNOSIS — J189 Pneumonia, unspecified organism: Secondary | ICD-10-CM | POA: Diagnosis not present

## 2018-10-11 DIAGNOSIS — R2689 Other abnormalities of gait and mobility: Secondary | ICD-10-CM | POA: Diagnosis not present

## 2018-10-11 DIAGNOSIS — M6281 Muscle weakness (generalized): Secondary | ICD-10-CM | POA: Diagnosis not present

## 2018-10-11 DIAGNOSIS — K449 Diaphragmatic hernia without obstruction or gangrene: Secondary | ICD-10-CM | POA: Diagnosis not present

## 2018-10-11 DIAGNOSIS — H4050X0 Glaucoma secondary to other eye disorders, unspecified eye, stage unspecified: Secondary | ICD-10-CM | POA: Diagnosis not present

## 2018-10-11 DIAGNOSIS — E039 Hypothyroidism, unspecified: Secondary | ICD-10-CM | POA: Diagnosis not present

## 2018-10-11 DIAGNOSIS — K5900 Constipation, unspecified: Secondary | ICD-10-CM | POA: Diagnosis not present

## 2018-10-11 DIAGNOSIS — M25552 Pain in left hip: Secondary | ICD-10-CM | POA: Diagnosis not present

## 2018-10-11 DIAGNOSIS — R001 Bradycardia, unspecified: Secondary | ICD-10-CM | POA: Diagnosis not present

## 2018-10-11 DIAGNOSIS — K219 Gastro-esophageal reflux disease without esophagitis: Secondary | ICD-10-CM | POA: Diagnosis not present

## 2018-10-12 DIAGNOSIS — R062 Wheezing: Secondary | ICD-10-CM | POA: Diagnosis not present

## 2018-10-12 DIAGNOSIS — R001 Bradycardia, unspecified: Secondary | ICD-10-CM | POA: Diagnosis not present

## 2018-10-12 DIAGNOSIS — S3210XD Unspecified fracture of sacrum, subsequent encounter for fracture with routine healing: Secondary | ICD-10-CM | POA: Diagnosis not present

## 2018-10-12 DIAGNOSIS — E039 Hypothyroidism, unspecified: Secondary | ICD-10-CM | POA: Diagnosis not present

## 2018-10-12 DIAGNOSIS — K5900 Constipation, unspecified: Secondary | ICD-10-CM | POA: Diagnosis not present

## 2018-10-12 DIAGNOSIS — M6281 Muscle weakness (generalized): Secondary | ICD-10-CM | POA: Diagnosis not present

## 2018-10-12 DIAGNOSIS — K219 Gastro-esophageal reflux disease without esophagitis: Secondary | ICD-10-CM | POA: Diagnosis not present

## 2018-10-12 DIAGNOSIS — R21 Rash and other nonspecific skin eruption: Secondary | ICD-10-CM | POA: Diagnosis not present

## 2018-10-12 DIAGNOSIS — K449 Diaphragmatic hernia without obstruction or gangrene: Secondary | ICD-10-CM | POA: Diagnosis not present

## 2018-10-12 DIAGNOSIS — H4050X0 Glaucoma secondary to other eye disorders, unspecified eye, stage unspecified: Secondary | ICD-10-CM | POA: Diagnosis not present

## 2018-10-13 DIAGNOSIS — M7918 Myalgia, other site: Secondary | ICD-10-CM | POA: Diagnosis not present

## 2018-10-13 DIAGNOSIS — M25552 Pain in left hip: Secondary | ICD-10-CM | POA: Diagnosis not present

## 2018-10-14 DIAGNOSIS — R001 Bradycardia, unspecified: Secondary | ICD-10-CM | POA: Diagnosis not present

## 2018-10-14 DIAGNOSIS — K449 Diaphragmatic hernia without obstruction or gangrene: Secondary | ICD-10-CM | POA: Diagnosis not present

## 2018-10-14 DIAGNOSIS — K219 Gastro-esophageal reflux disease without esophagitis: Secondary | ICD-10-CM | POA: Diagnosis not present

## 2018-10-14 DIAGNOSIS — M4848XD Fatigue fracture of vertebra, sacral and sacrococcygeal region, subsequent encounter for fracture with routine healing: Secondary | ICD-10-CM | POA: Diagnosis not present

## 2018-10-14 DIAGNOSIS — E039 Hypothyroidism, unspecified: Secondary | ICD-10-CM | POA: Diagnosis not present

## 2018-10-14 DIAGNOSIS — Z923 Personal history of irradiation: Secondary | ICD-10-CM | POA: Diagnosis not present

## 2018-10-14 DIAGNOSIS — Z8542 Personal history of malignant neoplasm of other parts of uterus: Secondary | ICD-10-CM | POA: Diagnosis not present

## 2018-10-14 DIAGNOSIS — M545 Low back pain: Secondary | ICD-10-CM | POA: Diagnosis not present

## 2018-10-18 DIAGNOSIS — M545 Low back pain: Secondary | ICD-10-CM | POA: Diagnosis not present

## 2018-10-18 DIAGNOSIS — J8410 Pulmonary fibrosis, unspecified: Secondary | ICD-10-CM | POA: Diagnosis not present

## 2018-10-18 DIAGNOSIS — R001 Bradycardia, unspecified: Secondary | ICD-10-CM | POA: Diagnosis not present

## 2018-10-18 DIAGNOSIS — E039 Hypothyroidism, unspecified: Secondary | ICD-10-CM | POA: Diagnosis not present

## 2018-10-18 DIAGNOSIS — K449 Diaphragmatic hernia without obstruction or gangrene: Secondary | ICD-10-CM | POA: Diagnosis not present

## 2018-10-18 DIAGNOSIS — M5432 Sciatica, left side: Secondary | ICD-10-CM | POA: Diagnosis not present

## 2018-10-18 DIAGNOSIS — G8929 Other chronic pain: Secondary | ICD-10-CM | POA: Diagnosis not present

## 2018-10-18 DIAGNOSIS — M4848XD Fatigue fracture of vertebra, sacral and sacrococcygeal region, subsequent encounter for fracture with routine healing: Secondary | ICD-10-CM | POA: Diagnosis not present

## 2018-10-18 DIAGNOSIS — K219 Gastro-esophageal reflux disease without esophagitis: Secondary | ICD-10-CM | POA: Diagnosis not present

## 2018-10-18 DIAGNOSIS — M84350G Stress fracture, pelvis, subsequent encounter for fracture with delayed healing: Secondary | ICD-10-CM | POA: Diagnosis not present

## 2018-10-20 DIAGNOSIS — M545 Low back pain: Secondary | ICD-10-CM | POA: Diagnosis not present

## 2018-10-20 DIAGNOSIS — K449 Diaphragmatic hernia without obstruction or gangrene: Secondary | ICD-10-CM | POA: Diagnosis not present

## 2018-10-20 DIAGNOSIS — K219 Gastro-esophageal reflux disease without esophagitis: Secondary | ICD-10-CM | POA: Diagnosis not present

## 2018-10-20 DIAGNOSIS — E039 Hypothyroidism, unspecified: Secondary | ICD-10-CM | POA: Diagnosis not present

## 2018-10-20 DIAGNOSIS — R001 Bradycardia, unspecified: Secondary | ICD-10-CM | POA: Diagnosis not present

## 2018-10-20 DIAGNOSIS — M4848XD Fatigue fracture of vertebra, sacral and sacrococcygeal region, subsequent encounter for fracture with routine healing: Secondary | ICD-10-CM | POA: Diagnosis not present

## 2018-10-24 DIAGNOSIS — K219 Gastro-esophageal reflux disease without esophagitis: Secondary | ICD-10-CM | POA: Diagnosis not present

## 2018-10-24 DIAGNOSIS — K449 Diaphragmatic hernia without obstruction or gangrene: Secondary | ICD-10-CM | POA: Diagnosis not present

## 2018-10-24 DIAGNOSIS — M545 Low back pain: Secondary | ICD-10-CM | POA: Diagnosis not present

## 2018-10-24 DIAGNOSIS — R001 Bradycardia, unspecified: Secondary | ICD-10-CM | POA: Diagnosis not present

## 2018-10-24 DIAGNOSIS — E039 Hypothyroidism, unspecified: Secondary | ICD-10-CM | POA: Diagnosis not present

## 2018-10-24 DIAGNOSIS — M4848XD Fatigue fracture of vertebra, sacral and sacrococcygeal region, subsequent encounter for fracture with routine healing: Secondary | ICD-10-CM | POA: Diagnosis not present

## 2018-10-25 DIAGNOSIS — M545 Low back pain: Secondary | ICD-10-CM | POA: Diagnosis not present

## 2018-10-25 DIAGNOSIS — R001 Bradycardia, unspecified: Secondary | ICD-10-CM | POA: Diagnosis not present

## 2018-10-25 DIAGNOSIS — K449 Diaphragmatic hernia without obstruction or gangrene: Secondary | ICD-10-CM | POA: Diagnosis not present

## 2018-10-25 DIAGNOSIS — E039 Hypothyroidism, unspecified: Secondary | ICD-10-CM | POA: Diagnosis not present

## 2018-10-25 DIAGNOSIS — M4848XD Fatigue fracture of vertebra, sacral and sacrococcygeal region, subsequent encounter for fracture with routine healing: Secondary | ICD-10-CM | POA: Diagnosis not present

## 2018-10-25 DIAGNOSIS — K219 Gastro-esophageal reflux disease without esophagitis: Secondary | ICD-10-CM | POA: Diagnosis not present

## 2018-10-27 DIAGNOSIS — R630 Anorexia: Secondary | ICD-10-CM | POA: Diagnosis not present

## 2018-10-27 DIAGNOSIS — M84350G Stress fracture, pelvis, subsequent encounter for fracture with delayed healing: Secondary | ICD-10-CM | POA: Diagnosis not present

## 2018-10-27 DIAGNOSIS — M5432 Sciatica, left side: Secondary | ICD-10-CM | POA: Diagnosis not present

## 2018-10-27 DIAGNOSIS — F17290 Nicotine dependence, other tobacco product, uncomplicated: Secondary | ICD-10-CM | POA: Diagnosis not present

## 2018-10-27 DIAGNOSIS — R600 Localized edema: Secondary | ICD-10-CM | POA: Diagnosis not present

## 2018-10-27 DIAGNOSIS — Z7901 Long term (current) use of anticoagulants: Secondary | ICD-10-CM | POA: Diagnosis not present

## 2018-10-27 DIAGNOSIS — I731 Thromboangiitis obliterans [Buerger's disease]: Secondary | ICD-10-CM | POA: Diagnosis not present

## 2018-10-27 DIAGNOSIS — E039 Hypothyroidism, unspecified: Secondary | ICD-10-CM | POA: Diagnosis not present

## 2018-10-28 DIAGNOSIS — R001 Bradycardia, unspecified: Secondary | ICD-10-CM | POA: Diagnosis not present

## 2018-10-28 DIAGNOSIS — M545 Low back pain: Secondary | ICD-10-CM | POA: Diagnosis not present

## 2018-10-28 DIAGNOSIS — K219 Gastro-esophageal reflux disease without esophagitis: Secondary | ICD-10-CM | POA: Diagnosis not present

## 2018-10-28 DIAGNOSIS — M4848XD Fatigue fracture of vertebra, sacral and sacrococcygeal region, subsequent encounter for fracture with routine healing: Secondary | ICD-10-CM | POA: Diagnosis not present

## 2018-10-28 DIAGNOSIS — K449 Diaphragmatic hernia without obstruction or gangrene: Secondary | ICD-10-CM | POA: Diagnosis not present

## 2018-10-28 DIAGNOSIS — E039 Hypothyroidism, unspecified: Secondary | ICD-10-CM | POA: Diagnosis not present

## 2018-10-31 ENCOUNTER — Telehealth: Payer: Self-pay | Admitting: *Deleted

## 2018-10-31 NOTE — Telephone Encounter (Signed)
Patient's daughter called stating "Mom was in the hospital last month with pain in her hip. They did an xray, CT and MRI. We were told by one doctor that she has a hip fracture and another said she fractured her tailbone. One said there "may be cancer back in there in you just don't know". I was wondering if Dr. Denman George could look at those and call me with here her ideas of if there is cancer or not. Mom is still in a lot of pain." Explained that I would give the message to both Melissa APP and Dr. Denman George.

## 2018-10-31 NOTE — Telephone Encounter (Signed)
Please call the daughter Izora Gala at [phone]

## 2018-11-07 ENCOUNTER — Telehealth: Payer: Self-pay | Admitting: Oncology

## 2018-11-07 NOTE — Telephone Encounter (Signed)
Called Tanya Martinez and advised her that Dr. Sondra Come has reviewed Tanya Martinez's scans and does not see any signs of cancer in her bones. He also does not think the fractures were from radiation because she received a small amount to the area.  He thinks the fractures are from her age.  She verbalized understanding and agreement.

## 2018-11-10 DIAGNOSIS — I10 Essential (primary) hypertension: Secondary | ICD-10-CM | POA: Diagnosis not present

## 2018-11-10 DIAGNOSIS — E039 Hypothyroidism, unspecified: Secondary | ICD-10-CM | POA: Diagnosis not present

## 2018-11-10 DIAGNOSIS — G8929 Other chronic pain: Secondary | ICD-10-CM | POA: Diagnosis not present

## 2018-11-10 DIAGNOSIS — M84350G Stress fracture, pelvis, subsequent encounter for fracture with delayed healing: Secondary | ICD-10-CM | POA: Diagnosis not present

## 2018-11-10 DIAGNOSIS — M546 Pain in thoracic spine: Secondary | ICD-10-CM | POA: Diagnosis not present

## 2018-11-10 DIAGNOSIS — M5432 Sciatica, left side: Secondary | ICD-10-CM | POA: Diagnosis not present

## 2018-11-17 DIAGNOSIS — J8410 Pulmonary fibrosis, unspecified: Secondary | ICD-10-CM | POA: Diagnosis not present

## 2018-11-17 DIAGNOSIS — S329XXG Fracture of unspecified parts of lumbosacral spine and pelvis, subsequent encounter for fracture with delayed healing: Secondary | ICD-10-CM | POA: Diagnosis not present

## 2018-11-17 DIAGNOSIS — G8929 Other chronic pain: Secondary | ICD-10-CM | POA: Diagnosis not present

## 2018-11-17 DIAGNOSIS — M84352G Stress fracture, left femur, subsequent encounter for fracture with delayed healing: Secondary | ICD-10-CM | POA: Diagnosis not present

## 2018-11-25 DIAGNOSIS — S329XXG Fracture of unspecified parts of lumbosacral spine and pelvis, subsequent encounter for fracture with delayed healing: Secondary | ICD-10-CM | POA: Diagnosis not present

## 2018-11-25 DIAGNOSIS — M84352G Stress fracture, left femur, subsequent encounter for fracture with delayed healing: Secondary | ICD-10-CM | POA: Diagnosis not present

## 2018-12-01 DIAGNOSIS — M84352G Stress fracture, left femur, subsequent encounter for fracture with delayed healing: Secondary | ICD-10-CM | POA: Diagnosis not present

## 2018-12-01 DIAGNOSIS — S329XXG Fracture of unspecified parts of lumbosacral spine and pelvis, subsequent encounter for fracture with delayed healing: Secondary | ICD-10-CM | POA: Diagnosis not present

## 2018-12-22 DIAGNOSIS — M546 Pain in thoracic spine: Secondary | ICD-10-CM | POA: Diagnosis not present

## 2018-12-22 DIAGNOSIS — E039 Hypothyroidism, unspecified: Secondary | ICD-10-CM | POA: Diagnosis not present

## 2018-12-22 DIAGNOSIS — M5432 Sciatica, left side: Secondary | ICD-10-CM | POA: Diagnosis not present

## 2018-12-22 DIAGNOSIS — M84350G Stress fracture, pelvis, subsequent encounter for fracture with delayed healing: Secondary | ICD-10-CM | POA: Diagnosis not present

## 2018-12-22 DIAGNOSIS — I10 Essential (primary) hypertension: Secondary | ICD-10-CM | POA: Diagnosis not present

## 2018-12-24 DIAGNOSIS — M546 Pain in thoracic spine: Secondary | ICD-10-CM | POA: Diagnosis not present

## 2018-12-24 DIAGNOSIS — E039 Hypothyroidism, unspecified: Secondary | ICD-10-CM | POA: Diagnosis not present

## 2018-12-24 DIAGNOSIS — M5432 Sciatica, left side: Secondary | ICD-10-CM | POA: Diagnosis not present

## 2018-12-24 DIAGNOSIS — I10 Essential (primary) hypertension: Secondary | ICD-10-CM | POA: Diagnosis not present

## 2018-12-24 DIAGNOSIS — M84350G Stress fracture, pelvis, subsequent encounter for fracture with delayed healing: Secondary | ICD-10-CM | POA: Diagnosis not present

## 2019-01-04 ENCOUNTER — Telehealth: Payer: Self-pay | Admitting: *Deleted

## 2019-01-04 NOTE — Telephone Encounter (Addendum)
Called and moved the appt from 5/27 to 6/22

## 2019-01-11 ENCOUNTER — Ambulatory Visit: Payer: Medicare Other | Admitting: Gynecologic Oncology

## 2019-01-13 DIAGNOSIS — M546 Pain in thoracic spine: Secondary | ICD-10-CM | POA: Diagnosis not present

## 2019-01-13 DIAGNOSIS — I10 Essential (primary) hypertension: Secondary | ICD-10-CM | POA: Diagnosis not present

## 2019-01-13 DIAGNOSIS — M5432 Sciatica, left side: Secondary | ICD-10-CM | POA: Diagnosis not present

## 2019-01-13 DIAGNOSIS — E039 Hypothyroidism, unspecified: Secondary | ICD-10-CM | POA: Diagnosis not present

## 2019-01-25 DIAGNOSIS — H4051X3 Glaucoma secondary to other eye disorders, right eye, severe stage: Secondary | ICD-10-CM | POA: Diagnosis not present

## 2019-01-25 DIAGNOSIS — H40002 Preglaucoma, unspecified, left eye: Secondary | ICD-10-CM | POA: Diagnosis not present

## 2019-01-30 DIAGNOSIS — E039 Hypothyroidism, unspecified: Secondary | ICD-10-CM | POA: Diagnosis not present

## 2019-01-30 DIAGNOSIS — M5432 Sciatica, left side: Secondary | ICD-10-CM | POA: Diagnosis not present

## 2019-01-30 DIAGNOSIS — I10 Essential (primary) hypertension: Secondary | ICD-10-CM | POA: Diagnosis not present

## 2019-01-30 DIAGNOSIS — M546 Pain in thoracic spine: Secondary | ICD-10-CM | POA: Diagnosis not present

## 2019-01-31 DIAGNOSIS — S329XXG Fracture of unspecified parts of lumbosacral spine and pelvis, subsequent encounter for fracture with delayed healing: Secondary | ICD-10-CM | POA: Diagnosis not present

## 2019-01-31 DIAGNOSIS — J8410 Pulmonary fibrosis, unspecified: Secondary | ICD-10-CM | POA: Diagnosis not present

## 2019-01-31 DIAGNOSIS — G8929 Other chronic pain: Secondary | ICD-10-CM | POA: Diagnosis not present

## 2019-01-31 DIAGNOSIS — M84352G Stress fracture, left femur, subsequent encounter for fracture with delayed healing: Secondary | ICD-10-CM | POA: Diagnosis not present

## 2019-02-06 ENCOUNTER — Inpatient Hospital Stay: Payer: Medicare Other | Attending: Gynecologic Oncology | Admitting: Gynecologic Oncology

## 2019-02-06 ENCOUNTER — Telehealth: Payer: Self-pay | Admitting: *Deleted

## 2019-02-06 ENCOUNTER — Other Ambulatory Visit: Payer: Self-pay

## 2019-02-06 ENCOUNTER — Encounter: Payer: Self-pay | Admitting: Gynecologic Oncology

## 2019-02-06 VITALS — BP 148/84 | HR 79 | Temp 99.0°F | Resp 17 | Ht 64.0 in | Wt 115.4 lb

## 2019-02-06 DIAGNOSIS — C7982 Secondary malignant neoplasm of genital organs: Secondary | ICD-10-CM | POA: Insufficient documentation

## 2019-02-06 DIAGNOSIS — Z90710 Acquired absence of both cervix and uterus: Secondary | ICD-10-CM | POA: Diagnosis not present

## 2019-02-06 DIAGNOSIS — Z90722 Acquired absence of ovaries, bilateral: Secondary | ICD-10-CM | POA: Diagnosis not present

## 2019-02-06 DIAGNOSIS — Z923 Personal history of irradiation: Secondary | ICD-10-CM

## 2019-02-06 DIAGNOSIS — C541 Malignant neoplasm of endometrium: Secondary | ICD-10-CM

## 2019-02-06 NOTE — Telephone Encounter (Signed)
Called and left Enid Derry a message to schedule the patient for September

## 2019-02-06 NOTE — Telephone Encounter (Signed)
CALLED PATIENT TO INFORM OF FU APPT. WITH DR. kINARD ON 05-15-19 @ 11 AM, LVM FOR A RETURN CALL

## 2019-02-06 NOTE — Patient Instructions (Signed)
Please notify Dr Denman George at phone number [phone] if you notice vaginal bleeding, new pelvic or abdominal pains, bloating, feeling full easy, or a change in bladder or bowel function.   Please have Dr Clabe Seal office contact Dr Serita Grit office (at [phone]) in September, 2020 to request an appointment with her for December, 2020.  Dr Denman George has scheduled follow-up for you with Dr Sondra Come in September, 2020.

## 2019-02-06 NOTE — Progress Notes (Signed)
Follow-up Note: Gyn-Onc  Consult was requested by Dr. Ulanda Edison for the evaluation of Tanya Martinez 85 y.o. female  CC:  Chief Complaint  Patient presents with  . endometrial cancer    follow-up    Assessment/Plan:  Tanya Martinez  is a 85 y.o.  year old with history of recurrent endometrial carcinoma (grade 1) at the distal vagina and bladder dome treated with transurethral resection and whole pelvic RT completed 11/03/17.  She has had an enduring complete clinical response to therapy.  I recommend she follow-up with Dr Sondra Come in 3 months and myself in 6 months.  Will transition to 6 monthly exams in March, 2021.  If relapse develops, would consider hormonal therapy.   HPI: Tanya Martinez is a 85 year old P1 who is seen in consultation at the request of Dr Daiva Huge for endometrial cancer recurrence.  The patient has a remote history of a TAH BSO in 1989 with Dr Ulanda Edison for grade 1, stage IA (clinical staging) endometrial cancer.  She was managed expectantly and did well.  In mid October, 2018 she noticed vaginal bleeding and saw Dr Ulanda Edison. On his exam a polypoid outgrowth was noted in the distal third of the left vaginal sidewall. Her vagina is substantially narrowed from atrophy and exam was difficult. The lesion broke off spontaneously with no gross residual remaining. It was sent for pathology which revealed a grade 1 endometrioid adenocarcinoma.   The patient is fairly healthy despite her advanced age. She has pulmonary fibrosis. She has had 1 cesarean section.  PET on 07/22/17 showed a 3cm hypermetabolic pelvic mass involving the posterior dome of the urinary bladder and adjacent fat consistent with recurrent endometrial carcinoma.   She underwent a transurethral resection of the bladder mass with Dr Tresa Moore on 08/25/17.  Final pathology revealed adenocarcinoma with squamous differentiation consistent with endometrial primary.   She then went on to complete whole pelvic XRT: Radiation  treatment dates:   09/29/2017 - 11/03/2017 Site/dose:   Pelvis/ 45 Gy delivered in 25 fractions of 1.8 Gy  She had substantial GU and GI toxicity with radiation.   She had a complete clinical response.   Interval Hx.  In March, 2020 she developed pelvic fractures. Imaging did not show evidence of recurrent disease and these were felt to not be secondary to radiation changes. They were treated conservatively and she is now doing well.  Today we discussed survivorship issues including weight gain and nutrition.   Current Meds:  Outpatient Encounter Medications as of 02/06/2019  Medication Sig  . acetaminophen (TYLENOL) 325 MG tablet Take 2 tablets (650 mg total) by mouth every 6 (six) hours as needed for mild pain.  Marland Kitchen albuterol (PROVENTIL HFA;VENTOLIN HFA) 108 (90 Base) MCG/ACT inhaler Inhale 1-2 puffs into the lungs every 4 (four) hours as needed for wheezing or shortness of breath.  . Calcium Carbonate-Vit D-Min (CALTRATE 600+D PLUS MINERALS) 600-800 MG-UNIT TABS Take 1 tablet by mouth daily.   Marland Kitchen latanoprost (XALATAN) 0.005 % ophthalmic solution Place 1 drop into both eyes at bedtime.   Marland Kitchen levothyroxine (SYNTHROID, LEVOTHROID) 50 MCG tablet Take 50 mcg by mouth daily before breakfast.   . lidocaine (LIDODERM) 5 % Place 1 patch onto the skin daily. Remove & Discard patch within 12 hours or as directed by MD  . omeprazole (PRILOSEC OTC) 20 MG tablet Take 20 mg by mouth daily.  . polyethylene glycol (MIRALAX / GLYCOLAX) packet Take 17 g by mouth daily as needed for moderate constipation  or severe constipation (use first).  Marland Kitchen senna-docusate (SENOKOT-S) 8.6-50 MG tablet Take 2 tablets by mouth at bedtime as needed for mild constipation or moderate constipation.   No facility-administered encounter medications on file as of 02/06/2019.     Allergy:  Allergies  Allergen Reactions  . Nystatin-Triamcinolone Other (See Comments)    Redness, burning in the external vaginal area, broke out all over  bottom and vaginal external area.  . Azithromycin     GI Upset (intolerance)  . Shellfish Allergy Nausea Only  . Sulfa Antibiotics Other (See Comments)    "Lips felt numb"    Social Hx:   Social History   Socioeconomic History  . Marital status: Widowed    Spouse name: Not on file  . Number of children: 1  . Years of education: Not on file  . Highest education level: Not on file  Occupational History  . Occupation: retired  Scientific laboratory technician  . Financial resource strain: Not on file  . Food insecurity    Worry: Not on file    Inability: Not on file  . Transportation needs    Medical: Not on file    Non-medical: Not on file  Tobacco Use  . Smoking status: Never Smoker  . Smokeless tobacco: Never Used  Substance and Sexual Activity  . Alcohol use: Yes    Comment: occasionally  . Drug use: No  . Sexual activity: Not Currently  Lifestyle  . Physical activity    Days per week: 7 days    Minutes per session: 10 min  . Stress: Not on file  Relationships  . Social Herbalist on phone: Not on file    Gets together: Not on file    Attends religious service: Not on file    Active member of club or organization: Not on file    Attends meetings of clubs or organizations: Not on file    Relationship status: Not on file  . Intimate partner violence    Fear of current or ex partner: Not on file    Emotionally abused: Not on file    Physically abused: Not on file    Forced sexual activity: Not on file  Other Topics Concern  . Not on file  Social History Narrative  . Not on file    Past Surgical Hx:  Past Surgical History:  Procedure Laterality Date  . BREAST LUMPECTOMY     2 lumps removed from rt breast  . CESAREAN SECTION    . COLONOSCOPY    . CYSTOSCOPY W/ RETROGRADES Bilateral 08/25/2017   Procedure: CYSTOSCOPY WITH BILATERAL RETROGRADE PYELOGRAM;  Surgeon: Alexis Frock, MD;  Location: [facility];  Service: Urology;  Laterality: Bilateral;  . EYE SURGERY      detached retina  . HEMORRHOID SURGERY    . HYSTERECTOMY ABDOMINAL WITH SALPINGECTOMY  1989  . THYROID SURGERY     growth removed  . TRANSURETHRAL RESECTION OF BLADDER TUMOR N/A 08/25/2017   Procedure: TRANSURETHRAL RESECTION OF BLADDER TUMOR (TURBT);  Surgeon: Alexis Frock, MD;  Location: [facility];  Service: Urology;  Laterality: N/A;    Past Medical Hx:  Past Medical History:  Diagnosis Date  . Acid reflux   . Colon polyps   . Glaucoma   . History of radiation therapy 09/29/17-11/03/17   45 Gy to the pelvis in 25 fractions  . Hypothyroidism   . Pneumonia   . Uterine cancer [facility])     Past Gynecological History:  C/s x 1, history of stage I endometrial cancer in 1989. No LMP recorded. Patient is postmenopausal.  Family Hx:  Family History  Problem Relation Age of Onset  . Heart attack Mother   . Heart disease Mother   . Heart attack Brother   . Heart disease Brother   . Heart attack Sister   . Heart disease Sister   . Stroke Father     Review of Systems:  Constitutional  Feels well,    ENT Normal appearing ears and nares bilaterally Skin/Breast  No rash, sores, jaundice, itching, dryness Cardiovascular  No chest pain, shortness of breath, or edema  Pulmonary  No cough or wheeze.  Gastro Intestinal  No nausea, vomitting, or diarrhoea. No bright red blood per rectum, no abdominal pain, change in bowel movement, or constipation.  Genito Urinary  No frequency, urgency, dysuria, no vaginal spotting, + vulvar irritation. Musculo Skeletal  No myalgia, arthralgia, joint swelling or pain  Neurologic  No weakness, numbness, change in gait,  Psychology  No depression, anxiety, insomnia.   Vitals:  Blood pressure (!) 148/84, pulse 79, temperature 99 F (37.2 C), temperature source Oral, resp. rate 17, height 5\' 4"  (1.626 m), weight 115 lb 6.4 oz (52.3 kg), SpO2 99 %.  Physical Exam: WD in NAD Neck  Supple NROM, without any enlargements.  Lymph Node Survey No  cervical supraclavicular or inguinal adenopathy Cardiovascular  Pulse normal rate, regularity and rhythm. S1 and S2 normal.  Lungs  Clear to auscultation bilateraly, without wheezes/crackles/rhonchi. Good air movement.  Skin  No rash/lesions/breakdown  Psychiatry  Alert and oriented to person, place, and time  Abdomen  Normoactive bowel sounds, abdomen soft, non-tender and thin without evidence of hernia.  Back No CVA tenderness Genito Urinary  Vulva/vagina: radiation changes and wet desquamation to labia majora. No cellulitis.   Bladder/urethra:  No lesions or masses, well supported bladder  Vagina: very narrow introitus, accomodates narrow speculum and single digit on exam. No visible or palpable residual disease on left sidewall.   Cervix and uterus surgically absent  Adnexa: no palpable masses. Rectal  deferred Extremities  No bilateral cyanosis, clubbing or edema.   Thereasa Solo, MD  02/06/2019, 2:07 PM

## 2019-02-14 DIAGNOSIS — R262 Difficulty in walking, not elsewhere classified: Secondary | ICD-10-CM | POA: Diagnosis not present

## 2019-02-21 DIAGNOSIS — R262 Difficulty in walking, not elsewhere classified: Secondary | ICD-10-CM | POA: Diagnosis not present

## 2019-03-02 DIAGNOSIS — R262 Difficulty in walking, not elsewhere classified: Secondary | ICD-10-CM | POA: Diagnosis not present

## 2019-03-06 DIAGNOSIS — M8589 Other specified disorders of bone density and structure, multiple sites: Secondary | ICD-10-CM | POA: Diagnosis not present

## 2019-03-06 DIAGNOSIS — J449 Chronic obstructive pulmonary disease, unspecified: Secondary | ICD-10-CM | POA: Diagnosis not present

## 2019-03-06 DIAGNOSIS — E039 Hypothyroidism, unspecified: Secondary | ICD-10-CM | POA: Diagnosis not present

## 2019-03-06 DIAGNOSIS — K219 Gastro-esophageal reflux disease without esophagitis: Secondary | ICD-10-CM | POA: Diagnosis not present

## 2019-03-07 DIAGNOSIS — R262 Difficulty in walking, not elsewhere classified: Secondary | ICD-10-CM | POA: Diagnosis not present

## 2019-03-08 DIAGNOSIS — M546 Pain in thoracic spine: Secondary | ICD-10-CM | POA: Diagnosis not present

## 2019-03-08 DIAGNOSIS — I739 Peripheral vascular disease, unspecified: Secondary | ICD-10-CM | POA: Diagnosis not present

## 2019-03-08 DIAGNOSIS — M5432 Sciatica, left side: Secondary | ICD-10-CM | POA: Diagnosis not present

## 2019-03-08 DIAGNOSIS — I10 Essential (primary) hypertension: Secondary | ICD-10-CM | POA: Diagnosis not present

## 2019-03-11 DIAGNOSIS — I739 Peripheral vascular disease, unspecified: Secondary | ICD-10-CM | POA: Diagnosis not present

## 2019-03-11 DIAGNOSIS — M546 Pain in thoracic spine: Secondary | ICD-10-CM | POA: Diagnosis not present

## 2019-03-11 DIAGNOSIS — I10 Essential (primary) hypertension: Secondary | ICD-10-CM | POA: Diagnosis not present

## 2019-03-11 DIAGNOSIS — M169 Osteoarthritis of hip, unspecified: Secondary | ICD-10-CM | POA: Diagnosis not present

## 2019-04-18 DIAGNOSIS — F39 Unspecified mood [affective] disorder: Secondary | ICD-10-CM | POA: Diagnosis not present

## 2019-04-18 DIAGNOSIS — Z23 Encounter for immunization: Secondary | ICD-10-CM | POA: Diagnosis not present

## 2019-04-18 DIAGNOSIS — J8410 Pulmonary fibrosis, unspecified: Secondary | ICD-10-CM | POA: Diagnosis not present

## 2019-04-18 DIAGNOSIS — L720 Epidermal cyst: Secondary | ICD-10-CM | POA: Diagnosis not present

## 2019-05-15 ENCOUNTER — Encounter: Payer: Self-pay | Admitting: Radiation Oncology

## 2019-05-15 ENCOUNTER — Other Ambulatory Visit: Payer: Self-pay

## 2019-05-15 ENCOUNTER — Ambulatory Visit
Admission: RE | Admit: 2019-05-15 | Discharge: 2019-05-15 | Disposition: A | Discharge status: Home / Self Care | Payer: Medicare Other | Source: Ambulatory Visit | Attending: Radiation Oncology | Admitting: Radiation Oncology

## 2019-05-15 VITALS — BP 126/63 | HR 69 | Temp 98.3°F | Resp 18 | Ht 59.0 in | Wt 119.0 lb

## 2019-05-15 DIAGNOSIS — Z79899 Other long term (current) drug therapy: Secondary | ICD-10-CM | POA: Insufficient documentation

## 2019-05-15 DIAGNOSIS — K5900 Constipation, unspecified: Secondary | ICD-10-CM | POA: Diagnosis not present

## 2019-05-15 DIAGNOSIS — I739 Peripheral vascular disease, unspecified: Secondary | ICD-10-CM | POA: Diagnosis not present

## 2019-05-15 DIAGNOSIS — Z923 Personal history of irradiation: Secondary | ICD-10-CM | POA: Diagnosis not present

## 2019-05-15 DIAGNOSIS — Z08 Encounter for follow-up examination after completed treatment for malignant neoplasm: Secondary | ICD-10-CM | POA: Diagnosis not present

## 2019-05-15 DIAGNOSIS — M546 Pain in thoracic spine: Secondary | ICD-10-CM | POA: Diagnosis not present

## 2019-05-15 DIAGNOSIS — Z23 Encounter for immunization: Secondary | ICD-10-CM | POA: Diagnosis not present

## 2019-05-15 DIAGNOSIS — C541 Malignant neoplasm of endometrium: Secondary | ICD-10-CM

## 2019-05-15 DIAGNOSIS — Z8542 Personal history of malignant neoplasm of other parts of uterus: Secondary | ICD-10-CM | POA: Diagnosis not present

## 2019-05-15 DIAGNOSIS — I10 Essential (primary) hypertension: Secondary | ICD-10-CM | POA: Diagnosis not present

## 2019-05-15 NOTE — Patient Instructions (Signed)
Coronavirus (COVID-19) Are you at risk?  Are you at risk for the Coronavirus (COVID-19)?  To be considered HIGH RISK for Coronavirus (COVID-19), you have to meet the following criteria:  . Traveled to [place], [place], [place], [place] or [place]; or in the [place] to [place], [place], [place], or [place]; and have fever, cough, and shortness of breath within the last 2 weeks of travel OR . Been in close contact with a person diagnosed with COVID-19 within the last 2 weeks and have fever, cough, and shortness of breath . IF YOU DO NOT MEET THESE CRITERIA, YOU ARE CONSIDERED LOW RISK FOR COVID-19.  What to do if you are HIGH RISK for COVID-19?  . If you are having a medical emergency, call 911. . Seek medical care right away. Before you go to a doctor's office, urgent care or emergency department, call ahead and tell them about your recent travel, contact with someone diagnosed with COVID-19, and your symptoms. You should receive instructions from your physician's office regarding next steps of care.  . When you arrive at healthcare provider, tell the healthcare staff immediately you have returned from visiting [place], [place], [place], [place] or [place]; or traveled in the [place] to [place], [place], [place], or [place]; in the last two weeks or you have been in close contact with a person diagnosed with COVID-19 in the last 2 weeks.   . Tell the health care staff about your symptoms: fever, cough and shortness of breath. . After you have been seen by a medical provider, you will be either: o Tested for (COVID-19) and discharged home on quarantine except to seek medical care if symptoms worsen, and asked to  - Stay home and avoid contact with others until you get your results (4-5 days)  - Avoid travel on public transportation if possible (such as bus, train, or airplane) or o Sent to the Emergency Department by EMS for evaluation, COVID-19 testing, and possible  admission depending on your condition and test results.  What to do if you are LOW RISK for COVID-19?  Reduce your risk of any infection by using the same precautions used for avoiding the common cold or flu:  . Wash your hands often with soap and warm water for at least 20 seconds.  If soap and water are not readily available, use an alcohol-based hand sanitizer with at least 60% alcohol.  . If coughing or sneezing, cover your mouth and nose by coughing or sneezing into the elbow areas of your shirt or coat, into a tissue or into your sleeve (not your hands). . Avoid shaking hands with others and consider head nods or verbal greetings only. . Avoid touching your eyes, nose, or mouth with unwashed hands.  . Avoid close contact with people who are sick. . Avoid places or events with large numbers of people in one location, like concerts or sporting events. . Carefully consider travel plans you have or are making. . If you are planning any travel outside or inside the [place], visit the CDC's Travelers' Health webpage for the latest health notices. . If you have some symptoms but not all symptoms, continue to monitor at home and seek medical attention if your symptoms worsen. . If you are having a medical emergency, call 911.   ADDITIONAL HEALTHCARE OPTIONS FOR PATIENTS  [facility] / e-Visit: [email or web address]         [facility]: [phone]  [facility]: [phone]                   [facility]: [phone]   

## 2019-05-15 NOTE — Progress Notes (Signed)
Radiation Oncology         [phone] ________________________________  Name: Tanya Martinez MRN: [id number]  Date: 05/15/2019  DOB: 07-07-1923   Follow-Up Visit Note  CC: Alroy Dust, L.Marlou Sa, MD  Everitt Amber, MD    ICD-10-CM   1. Recurrent carcinoma of endometrium (HCC)  C54.1     Diagnosis:   Recurrent endometrial carcinoma (grade 1) at themid vaginaarea as well as posterior dome of the bladder     Interval Since Last Radiation:  5 months 09/29/2017 - 11/03/2017 Pelvis/ 45 Gy delivered in 25 fractions of 1.8 Gy, she was unable to proceed with vaginal brachytherapy given her persistent pain in the vaginal region.  Narrative:  The patient returns today for routine follow-up. She is doing well overall. She is accompanied by her daughter today. In 10/2018, she developed pelvic fractures. Imaging at that time showed no evidence of recurrent disease. She last saw Dr. Denman George on 02/06/2019 with no evidence of recurrence on exam.  On review of systems, she reports occasional constipation, which is relieved by prune juice. She denies pain, dysuria or hematuria, vaginal discharge or bleeding, rectal bleeding, diarrhea, abdominal bloating, and nausea or vomiting.  Overall she feels the best she has her diagnosis and treatment.                 ALLERGIES:  is allergic to nystatin-triamcinolone; azithromycin; shellfish allergy; and sulfa antibiotics.  Meds: Current Outpatient Medications  Medication Sig Dispense Refill  . acetaminophen (TYLENOL) 325 MG tablet Take 2 tablets (650 mg total) by mouth every 6 (six) hours as needed for mild pain.    Marland Kitchen albuterol (PROVENTIL HFA;VENTOLIN HFA) 108 (90 Base) MCG/ACT inhaler Inhale 1-2 puffs into the lungs every 4 (four) hours as needed for wheezing or shortness of breath.    . Calcium Carbonate-Vit D-Min (CALTRATE 600+D PLUS MINERALS) 600-800 MG-UNIT TABS Take 1 tablet by mouth daily.     Marland Kitchen gabapentin (NEURONTIN) 100 MG capsule     . latanoprost (XALATAN)  0.005 % ophthalmic solution Place 1 drop into both eyes at bedtime.     Marland Kitchen levothyroxine (SYNTHROID, LEVOTHROID) 50 MCG tablet Take 50 mcg by mouth daily before breakfast.     . mirtazapine (REMERON) 15 MG tablet     . omeprazole (PRILOSEC OTC) 20 MG tablet Take 20 mg by mouth daily.    Marland Kitchen lidocaine (LIDODERM) 5 % Place 1 patch onto the skin daily. Remove & Discard patch within 12 hours or as directed by MD (Patient not taking: Reported on 05/15/2019) 30 patch 0  . polyethylene glycol (MIRALAX / GLYCOLAX) packet Take 17 g by mouth daily as needed for moderate constipation or severe constipation (use first). (Patient not taking: Reported on 05/15/2019) 14 each 0  . senna-docusate (SENOKOT-S) 8.6-50 MG tablet Take 2 tablets by mouth at bedtime as needed for mild constipation or moderate constipation. (Patient not taking: Reported on 05/15/2019)     No current facility-administered medications for this encounter.     Physical Findings: The patient is in no acute distress. Patient is alert and oriented.  height is 4\' 11"  (1.499 m) and weight is 119 lb (54 kg). Her temporal temperature is 98.3 F (36.8 C). Her blood pressure is 126/63 and her pulse is 69. Her respiration is 18 and oxygen saturation is 98%. .   Lungs with mild crackles in bilateral lower bases. Heart has regular rate and rhythm. No palpable cervical, supraclavicular, or axillary adenopathy. Abdomen soft, non-tender, normal bowel sounds.  On pelvic examination the external genitalia are unremarkable.  A speculum exam was performed.  Exam with a small speculum today.  No mucosal lesions noted in the vaginal vault.  Digital exam with index finger revealed no vaginal masses.  Bimanual examination revealed no pelvic masses.  Lab Findings: Lab Results  Component Value Date   WBC 5.1 09/28/2018   HGB 11.9 (L) 09/28/2018   HCT 36.7 09/28/2018   MCV 101.7 (H) 09/28/2018   PLT 162 09/28/2018    Radiographic Findings: No results found.   Impression:  Recurrent endometrial carcinoma (grade 1) at themid vaginaarea as well as posterior dome of the bladder    Clinically stable.  No evidence of recurrence on clinical exam today.  As above the patient is feeling quite well and her advanced age of 19.  Plan:  Patient will see Dr. Denman George in 3 months, and follow up with radiation oncology in 6 months.   ____________________________________   Blair Promise, PhD, MD   This document serves as a record of services personally performed by Gery Pray, MD. It was created on his behalf by Wilburn Mylar, a trained medical scribe. The creation of this record is based on the scribe's personal observations and the provider's statements to them. This document has been checked and approved by the attending provider.

## 2019-05-15 NOTE — Progress Notes (Signed)
Pt presents today for f/u with Dr. Sondra Come. Pt denies c/o pain. Pt is accompanied by daughter. Pt denies dysuria/hematuria. Pt denies vaginal bleeding/discharge. Pt denies rectal bleeding, diarrhea. Pt reports occasional constipation that is relieved by prune juice. Pt denies abdominal bloating, N/V.  BP 126/63 (BP Location: Left Arm, Patient Position: Sitting)   Pulse 69   Temp 98.3 F (36.8 C) (Temporal)   Resp 18   Ht 4\' 11"  (1.499 m)   Wt 119 lb (54 kg)   SpO2 98%   BMI 24.04 kg/m   Wt Readings from Last 3 Encounters:  05/15/19 119 lb (54 kg)  02/06/19 115 lb 6.4 oz (52.3 kg)  09/24/18 120 lb 5.9 oz (54.6 kg)   Loma Sousa, RN BSN

## 2019-05-16 ENCOUNTER — Telehealth: Payer: Self-pay | Admitting: *Deleted

## 2019-05-16 NOTE — Telephone Encounter (Signed)
Shirley from radiation called and scheduled a follow up appt for the patient. She will contact the patient

## 2019-05-16 NOTE — Telephone Encounter (Signed)
Called patient to inform of fu with Dr. Denman George on 07-28-19 - arrival time- 11:45 am , appt. with Dr. Denman George is at 12:15 pm, spoke with patient and she is aware of this appt.

## 2019-06-01 DIAGNOSIS — I739 Peripheral vascular disease, unspecified: Secondary | ICD-10-CM | POA: Diagnosis not present

## 2019-06-01 DIAGNOSIS — M25811 Other specified joint disorders, right shoulder: Secondary | ICD-10-CM | POA: Diagnosis not present

## 2019-06-01 DIAGNOSIS — Z0001 Encounter for general adult medical examination with abnormal findings: Secondary | ICD-10-CM | POA: Diagnosis not present

## 2019-06-01 DIAGNOSIS — I10 Essential (primary) hypertension: Secondary | ICD-10-CM | POA: Diagnosis not present

## 2019-07-26 ENCOUNTER — Telehealth: Payer: Self-pay | Admitting: *Deleted

## 2019-07-26 NOTE — Telephone Encounter (Signed)
Returned the patient's call and moved her appt from Friday to January 14th

## 2019-07-28 ENCOUNTER — Inpatient Hospital Stay: Payer: Medicare Other | Admitting: Gynecologic Oncology

## 2019-08-31 ENCOUNTER — Ambulatory Visit: Payer: Medicare Other | Admitting: Gynecologic Oncology

## 2019-09-11 IMAGING — CT NM PET TUM IMG INITIAL (PI) SKULL BASE T - THIGH
1 of 8 series · 1 of 25 positions shown · non-contrast
Comparison: None.

CLINICAL DATA: Initial treatment strategy for recurrent endometrial
carcinoma..

EXAM:
NUCLEAR MEDICINE PET SKULL BASE TO THIGH
TECHNIQUE: 6.7 mCi F-18 FDG was injected intravenously. Full-ring PET imaging
was performed from the skull base to thigh after the radiotracer. CT
data was obtained and used for attenuation correction and anatomic
localization.
FASTING BLOOD GLUCOSE:  Value: 96 mg/dl

[Series 4: ct sk_thigh 5.0 b31f · axial · 5.0mm · 0.98mm/px · 1 of 233 slices shown]
[im 233/233  brain]
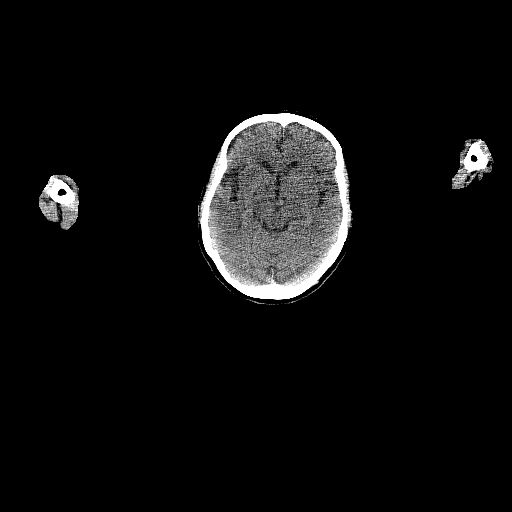

[1 of 25 positions shown; findings below may reference images not displayed]

FINDINGS: NECK:  No hypermetabolic lymph nodes or masses.

CHEST: No hypermetabolic masses or lymphadenopathy. No suspicious
pulmonary nodules seen on CT images. Chronic interstitial lung
disease with bibasilar predominant interstitial fibrosis is seen,
most consistent with usual interstitial pneumonia.

Aortic and coronary artery atherosclerosis. A moderate hiatal hernia
is also seen.

ABDOMEN/PELVIS: No abnormal hypermetabolic activity within the
liver, pancreas, adrenal glands, or spleen. No hypermetabolic lymph
nodes in the abdomen or pelvis.

Previous hysterectomy noted. A soft tissue mass is seen which
involves the posterior dome of the urinary bladder and adjacent
perivesical fat. This measures approximately 3.1 x 2.5 cm on image
165/4, and is hypermetabolic with activity nearly matching that
within the urinary bladder. This is consistent with recurrent
endometrial carcinoma. No other hypermetabolic masses identified. No
evidence of ascites. Sigmoid colon diverticulosis noted, without
evidence of diverticulitis.

SKELETON: No focal hypermetabolic bone lesions to suggest skeletal
metastasis.
IMPRESSION: 3 cm hypermetabolic pelvic mass involving the posterior dome of the
urinary bladder and adjacent fat, consistent with recurrent
endometrial carcinoma.

No other sites of recurrent or metastatic carcinoma identified.

Chronic interstitial lung disease with bibasilar predominant
fibrosis; suspect usual interstitial pneumonia.

Moderate hiatal hernia.

## 2019-09-29 DIAGNOSIS — H4051X3 Glaucoma secondary to other eye disorders, right eye, severe stage: Secondary | ICD-10-CM | POA: Diagnosis not present

## 2019-09-29 DIAGNOSIS — H40022 Open angle with borderline findings, high risk, left eye: Secondary | ICD-10-CM | POA: Diagnosis not present

## 2019-09-29 DIAGNOSIS — H52203 Unspecified astigmatism, bilateral: Secondary | ICD-10-CM | POA: Diagnosis not present

## 2019-09-29 DIAGNOSIS — Z961 Presence of intraocular lens: Secondary | ICD-10-CM | POA: Diagnosis not present

## 2019-09-29 DIAGNOSIS — H5203 Hypermetropia, bilateral: Secondary | ICD-10-CM | POA: Diagnosis not present

## 2019-09-29 DIAGNOSIS — H524 Presbyopia: Secondary | ICD-10-CM | POA: Diagnosis not present

## 2019-10-10 ENCOUNTER — Encounter: Payer: Self-pay | Admitting: Gynecologic Oncology

## 2019-10-10 ENCOUNTER — Inpatient Hospital Stay: Payer: Medicare Other | Attending: Gynecologic Oncology | Admitting: Gynecologic Oncology

## 2019-10-10 ENCOUNTER — Other Ambulatory Visit: Payer: Self-pay

## 2019-10-10 VITALS — BP 132/75 | HR 81 | Temp 98.7°F | Resp 16 | Ht 59.0 in | Wt 122.2 lb

## 2019-10-10 DIAGNOSIS — Z90722 Acquired absence of ovaries, bilateral: Secondary | ICD-10-CM | POA: Diagnosis not present

## 2019-10-10 DIAGNOSIS — C541 Malignant neoplasm of endometrium: Secondary | ICD-10-CM | POA: Diagnosis not present

## 2019-10-10 DIAGNOSIS — C7982 Secondary malignant neoplasm of genital organs: Secondary | ICD-10-CM | POA: Diagnosis not present

## 2019-10-10 DIAGNOSIS — Z923 Personal history of irradiation: Secondary | ICD-10-CM | POA: Diagnosis not present

## 2019-10-10 DIAGNOSIS — Z90710 Acquired absence of both cervix and uterus: Secondary | ICD-10-CM | POA: Diagnosis not present

## 2019-10-10 NOTE — Progress Notes (Signed)
Follow-up Note: Gyn-Onc  Consult was requested by Dr. Ulanda Edison for the evaluation of Tanya Martinez 86 y.o. female  CC:  Chief Complaint  Patient presents with  . endometrial cancer    Follow up  . Secondary malignant neoplasm of vagina [facility])    Assessment/Plan:  Tanya Martinez  is a 86 y.o.  year old with history of recurrent endometrial carcinoma (grade 1) at the distal vagina and bladder dome treated with transurethral resection and whole pelvic RT completed 11/03/17.  She has had an enduring complete clinical response to therapy.  I recommend she follow-up with Dr Sondra Come in 3 months and myself in November.  If relapse develops, would consider hormonal therapy.   HPI: Tanya Martinez is a 86 year old P1 who is seen in consultation at the request of Dr Daiva Huge for endometrial cancer recurrence.  The patient has a remote history of a TAH BSO in 1989 with Dr Ulanda Edison for grade 1, stage IA (clinical staging) endometrial cancer.  She was managed expectantly and did well.  In mid October, 2018 she noticed vaginal bleeding and saw Dr Ulanda Edison. On his exam a polypoid outgrowth was noted in the distal third of the left vaginal sidewall. Her vagina is substantially narrowed from atrophy and exam was difficult. The lesion broke off spontaneously with no gross residual remaining. It was sent for pathology which revealed a grade 1 endometrioid adenocarcinoma.   The patient is fairly healthy despite her advanced age. She has pulmonary fibrosis. She has had 1 cesarean section.  PET on 07/22/17 showed a 3cm hypermetabolic pelvic mass involving the posterior dome of the urinary bladder and adjacent fat consistent with recurrent endometrial carcinoma.   She underwent a transurethral resection of the bladder mass with Dr Tresa Moore on 08/25/17.  Final pathology revealed adenocarcinoma with squamous differentiation consistent with endometrial primary.   She then went on to complete whole pelvic XRT: Radiation  treatment dates:   09/29/2017 - 11/03/2017 Site/dose:   Pelvis/ 45 Gy delivered in 25 fractions of 1.8 Gy  She had substantial GU and GI toxicity with radiation.   She had a complete clinical response.   Interval Hx.  In March, 2020 she developed pelvic fractures. Imaging did not show evidence of recurrent disease and these were felt to not be secondary to radiation changes.   They were treated conservatively and she is now doing well. She is continuing to gain weight.  Today we discussed survivorship issues including weight gain and nutrition.   Current Meds:  Outpatient Encounter Medications as of 10/10/2019  Medication Sig  . acetaminophen (TYLENOL) 325 MG tablet Take 2 tablets (650 mg total) by mouth every 6 (six) hours as needed for mild pain.  Marland Kitchen albuterol (PROVENTIL HFA;VENTOLIN HFA) 108 (90 Base) MCG/ACT inhaler Inhale 1-2 puffs into the lungs every 4 (four) hours as needed for wheezing or shortness of breath.  . Calcium Carbonate-Vit D-Min (CALTRATE 600+D PLUS MINERALS) 600-800 MG-UNIT TABS Take 1 tablet by mouth daily.   Marland Kitchen gabapentin (NEURONTIN) 100 MG capsule   . latanoprost (XALATAN) 0.005 % ophthalmic solution Place 1 drop into both eyes at bedtime.   Marland Kitchen levothyroxine (SYNTHROID, LEVOTHROID) 50 MCG tablet Take 50 mcg by mouth daily before breakfast.   . mirtazapine (REMERON) 15 MG tablet   . omeprazole (PRILOSEC OTC) 20 MG tablet Take 20 mg by mouth daily.  Marland Kitchen UNABLE TO FIND as needed. Hemp cream  . [DISCONTINUED] lidocaine (LIDODERM) 5 % Place 1 patch onto the skin  daily. Remove & Discard patch within 12 hours or as directed by MD (Patient not taking: Reported on 05/15/2019)  . [DISCONTINUED] polyethylene glycol (MIRALAX / GLYCOLAX) packet Take 17 g by mouth daily as needed for moderate constipation or severe constipation (use first). (Patient not taking: Reported on 05/15/2019)  . [DISCONTINUED] senna-docusate (SENOKOT-S) 8.6-50 MG tablet Take 2 tablets by mouth at bedtime as  needed for mild constipation or moderate constipation. (Patient not taking: Reported on 05/15/2019)   No facility-administered encounter medications on file as of 10/10/2019.    Allergy:  Allergies  Allergen Reactions  . Nystatin-Triamcinolone Other (See Comments)    Redness, burning in the external vaginal area, broke out all over bottom and vaginal external area.  . Azithromycin     GI Upset (intolerance)  . Shellfish Allergy Nausea Only  . Sulfa Antibiotics Other (See Comments)    "Lips felt numb"    Social Hx:   Social History   Socioeconomic History  . Marital status: Widowed    Spouse name: Not on file  . Number of children: 1  . Years of education: Not on file  . Highest education level: Not on file  Occupational History  . Occupation: retired  Tobacco Use  . Smoking status: Never Smoker  . Smokeless tobacco: Never Used  Substance and Sexual Activity  . Alcohol use: Yes    Comment: occasionally  . Drug use: No  . Sexual activity: Not Currently  Other Topics Concern  . Not on file  Social History Narrative  . Not on file   Social Determinants of Health   Financial Resource Strain:   . Difficulty of Paying Living Expenses: Not on file  Food Insecurity:   . Worried About Charity fundraiser in the Last Year: Not on file  . Ran Out of Food in the Last Year: Not on file  Transportation Needs:   . Lack of Transportation (Medical): Not on file  . Lack of Transportation (Non-Medical): Not on file  Physical Activity:   . Days of Exercise per Week: Not on file  . Minutes of Exercise per Session: Not on file  Stress:   . Feeling of Stress : Not on file  Social Connections:   . Frequency of Communication with Friends and Family: Not on file  . Frequency of Social Gatherings with Friends and Family: Not on file  . Attends Religious Services: Not on file  . Active Member of Clubs or Organizations: Not on file  . Attends Archivist Meetings: Not on file   . Marital Status: Not on file  Intimate Partner Violence:   . Fear of Current or Ex-Partner: Not on file  . Emotionally Abused: Not on file  . Physically Abused: Not on file  . Sexually Abused: Not on file    Past Surgical Hx:  Past Surgical History:  Procedure Laterality Date  . BREAST LUMPECTOMY     2 lumps removed from rt breast  . CESAREAN SECTION    . COLONOSCOPY    . CYSTOSCOPY W/ RETROGRADES Bilateral 08/25/2017   Procedure: CYSTOSCOPY WITH BILATERAL RETROGRADE PYELOGRAM;  Surgeon: Alexis Frock, MD;  Location: [facility];  Service: Urology;  Laterality: Bilateral;  . EYE SURGERY     detached retina  . HEMORRHOID SURGERY    . HYSTERECTOMY ABDOMINAL WITH SALPINGECTOMY  1989  . THYROID SURGERY     growth removed  . TRANSURETHRAL RESECTION OF BLADDER TUMOR N/A 08/25/2017   Procedure: TRANSURETHRAL RESECTION OF  BLADDER TUMOR (TURBT);  Surgeon: Alexis Frock, MD;  Location: [facility];  Service: Urology;  Laterality: N/A;    Past Medical Hx:  Past Medical History:  Diagnosis Date  . Acid reflux   . Colon polyps   . Glaucoma   . History of radiation therapy 09/29/17-11/03/17   45 Gy to the pelvis in 25 fractions  . Hypothyroidism   . Pneumonia   . Uterine cancer [facility])     Past Gynecological History:  C/s x 1, history of stage I endometrial cancer in 1989. No LMP recorded. Patient is postmenopausal.  Family Hx:  Family History  Problem Relation Age of Onset  . Heart attack Mother   . Heart disease Mother   . Heart attack Brother   . Heart disease Brother   . Heart attack Sister   . Heart disease Sister   . Stroke Father     Review of Systems:  Constitutional  Feels well,    ENT Normal appearing ears and nares bilaterally Skin/Breast  No rash, sores, jaundice, itching, dryness Cardiovascular  No chest pain, shortness of breath, or edema  Pulmonary  No cough or wheeze.  Gastro Intestinal  No nausea, vomitting, or diarrhoea. No bright red blood per rectum, no  abdominal pain, change in bowel movement, or constipation.  Genito Urinary  No frequency, urgency, dysuria, no vaginal spotting, + vulvar irritation. Musculo Skeletal  No myalgia, arthralgia, joint swelling or pain  Neurologic  No weakness, numbness, change in gait,  Psychology  No depression, anxiety, insomnia.   Vitals:  Blood pressure 132/75, pulse 81, temperature 98.7 F (37.1 C), temperature source Temporal, resp. rate 16, height 4\' 11"  (1.499 m), weight 122 lb 3.2 oz (55.4 kg), SpO2 98 %.  Physical Exam: WD in NAD Neck  Supple NROM, without any enlargements.  Lymph Node Survey No cervical supraclavicular or inguinal adenopathy Cardiovascular  Pulse normal rate, regularity and rhythm. S1 and S2 normal.  Lungs  Clear to auscultation bilateraly, without wheezes/crackles/rhonchi. Good air movement.  Skin  No rash/lesions/breakdown  Psychiatry  Alert and oriented to person, place, and time  Abdomen  Normoactive bowel sounds, abdomen soft, non-tender and thin without evidence of hernia.  Back No CVA tenderness Genito Urinary  Vulva/vagina: radiation changes and wet desquamation to labia majora. No cellulitis.   Bladder/urethra:  No lesions or masses, well supported bladder  Vagina: very narrow introitus, accomodates narrow speculum and single digit on exam. No visible or palpable residual disease on left sidewall.   Cervix and uterus surgically absent  Adnexa: no palpable masses. Rectal  deferred Extremities  No bilateral cyanosis, clubbing or edema.   Thereasa Solo, MD  10/10/2019, 2:40 PM

## 2019-10-10 NOTE — Patient Instructions (Signed)
Please notify Dr Denman George at phone number [phone] if you notice vaginal bleeding, new pelvic or abdominal pains, bloating, feeling full easy, or a change in bladder or bowel function.   You are now more than 2 years out from finishing treatments and can transition to 6 monthly checkups.  Please follow-up with Dr Sondra Come as scheduled.  Please contact Dr Serita Grit office (at [phone]) in April or afterwards to request an appointment with her for November, 2021.

## 2019-10-16 DIAGNOSIS — L720 Epidermal cyst: Secondary | ICD-10-CM | POA: Diagnosis not present

## 2019-10-16 DIAGNOSIS — E559 Vitamin D deficiency, unspecified: Secondary | ICD-10-CM | POA: Diagnosis not present

## 2019-10-16 DIAGNOSIS — F39 Unspecified mood [affective] disorder: Secondary | ICD-10-CM | POA: Diagnosis not present

## 2019-10-16 DIAGNOSIS — E039 Hypothyroidism, unspecified: Secondary | ICD-10-CM | POA: Diagnosis not present

## 2019-10-16 DIAGNOSIS — J8410 Pulmonary fibrosis, unspecified: Secondary | ICD-10-CM | POA: Diagnosis not present

## 2019-10-16 DIAGNOSIS — G8929 Other chronic pain: Secondary | ICD-10-CM | POA: Diagnosis not present

## 2019-11-07 ENCOUNTER — Telehealth: Payer: Self-pay | Admitting: *Deleted

## 2019-11-07 NOTE — Telephone Encounter (Signed)
RETURNED PATIENT'S PHONE CALL, SPOKE WITH PATIENT. ?

## 2019-11-09 ENCOUNTER — Ambulatory Visit: Payer: Self-pay | Admitting: Radiation Oncology

## 2019-11-09 ENCOUNTER — Ambulatory Visit: Payer: Medicare Other | Admitting: Radiation Oncology

## 2020-01-03 NOTE — Progress Notes (Signed)
Patient presents today for a f/u visit with Dr. Sondra Come.  Pain: 5 ongoing back and leg pain  Fatigue: Mild  Nausea/vomiting/diarrhea: No  Bladder issues:No  Vaginal or rectal bleeding: No  Skin irritation: None  BP 128/83 (BP Location: Right Arm, Patient Position: Sitting, Cuff Size: Normal)   Pulse 70   Temp 97.7 F (36.5 C)   Resp 20   Ht 5\' 4"  (1.626 m)   Wt 121 lb 6.4 oz (55.1 kg)   SpO2 99%   BMI 20.84 kg/m    Wt Readings from Last 3 Encounters:  01/04/20 121 lb 6.4 oz (55.1 kg)  10/10/19 122 lb 3.2 oz (55.4 kg)  05/15/19 119 lb (54 kg)

## 2020-01-03 NOTE — Progress Notes (Signed)
Radiation Oncology         [phone] ________________________________  Name: Tanya Martinez MRN: [id number]  Date: 01/04/2020  DOB: Oct 17, 1922   Follow-Up Visit Note  CC: Alroy Dust, L.Marlou Sa, MD  Everitt Amber, MD    ICD-10-CM   1. Secondary malignant neoplasm of vagina (HCC)  C79.82   2. Recurrent carcinoma of endometrium (HCC)  C54.1     Diagnosis:  Recurrent endometrial carcinoma (grade 1) at themid vaginaarea as well as posterior dome of the bladder     Interval Since Last Radiation: Two years and two months.  09/29/2017 - 11/03/2017 Pelvis / 45 Gy delivered in 25 fractions of 1.8 Gy, she was unable to proceed with vaginal brachytherapy given her persistent pain in the vaginal region.  Narrative:  The patient returns today for routine follow-up. She is doing well overall. She was last seen by Dr. Denman George on 10/10/2019, during which time she was noted to have a complete response to therapy.  On review of systems, she reports mild fatigue and ongoing back and leg pain. She denies nausea, vomiting, diarrhea, bladder issues, vaginal bleeding, rectal bleeding, and skin irritation.                 ALLERGIES:  is allergic to nystatin-triamcinolone; azithromycin; shellfish allergy; and sulfa antibiotics.  Meds: Current Outpatient Medications  Medication Sig Dispense Refill  . acetaminophen (TYLENOL) 325 MG tablet Take 2 tablets (650 mg total) by mouth every 6 (six) hours as needed for mild pain.    Marland Kitchen albuterol (PROVENTIL HFA;VENTOLIN HFA) 108 (90 Base) MCG/ACT inhaler Inhale 1-2 puffs into the lungs every 4 (four) hours as needed for wheezing or shortness of breath.    . Calcium Carbonate-Vit D-Min (CALTRATE 600+D PLUS MINERALS) 600-800 MG-UNIT TABS Take 1 tablet by mouth daily.     Marland Kitchen gabapentin (NEURONTIN) 100 MG capsule     . latanoprost (XALATAN) 0.005 % ophthalmic solution Place 1 drop into both eyes at bedtime.     Marland Kitchen levothyroxine (SYNTHROID, LEVOTHROID) 50 MCG tablet Take 50 mcg  by mouth daily before breakfast.     . mirtazapine (REMERON) 15 MG tablet     . omeprazole (PRILOSEC OTC) 20 MG tablet Take 20 mg by mouth daily.    Marland Kitchen UNABLE TO FIND as needed. Hemp cream     No current facility-administered medications for this encounter.    Physical Findings: The patient is in no acute distress. Patient is alert and oriented.  height is 5\' 4"  (1.626 m) and weight is 121 lb 6.4 oz (55.1 kg). Her temperature is 97.7 F (36.5 C). Her blood pressure is 128/83 and her pulse is 70. Her respiration is 20 and oxygen saturation is 99%.  Lungs with mild crackles throughout both lungs. heart has regular rate and rhythm. No palpable cervical, supraclavicular, or axillary adenopathy. Abdomen soft, non-tender, normal bowel sounds. On pelvic examination the external genitalia are unremarkable. A speculum exam was performed (pediatric speculum) No mucosal lesions noted in the vaginal vault. Digital exam with 5th digit  revealed no vaginal masses. Bimanual examination revealed no pelvic masses.   Lab Findings: Lab Results  Component Value Date   WBC 5.1 09/28/2018   HGB 11.9 (L) 09/28/2018   HCT 36.7 09/28/2018   MCV 101.7 (H) 09/28/2018   PLT 162 09/28/2018    Radiographic Findings: No results found.  Impression:  Recurrent endometrial carcinoma (grade 1) at themid vaginaarea as well as posterior dome of the bladder    Clinically  stable. No evidence of recurrence on clinical exam today.  Plan:  The patient will follow-up with Dr. Denman George in six months and with radiation oncology in one year.   ____________________________________   Blair Promise, PhD, MD  This document serves as a record of services personally performed by Gery Pray, MD. It was created on his behalf by Clerance Lav, a trained medical scribe. The creation of this record is based on the scribe's personal observations and the provider's statements to them. This document has been checked and approved by the  attending provider.

## 2020-01-04 ENCOUNTER — Encounter: Payer: Self-pay | Admitting: Radiation Oncology

## 2020-01-04 ENCOUNTER — Other Ambulatory Visit: Payer: Self-pay

## 2020-01-04 ENCOUNTER — Ambulatory Visit
Admission: RE | Admit: 2020-01-04 | Discharge: 2020-01-04 | Disposition: A | Discharge status: Home / Self Care | Payer: Medicare Other | Source: Ambulatory Visit | Attending: Radiation Oncology | Admitting: Radiation Oncology

## 2020-01-04 VITALS — BP 128/83 | HR 70 | Temp 97.7°F | Resp 20 | Ht 64.0 in | Wt 121.4 lb

## 2020-01-04 DIAGNOSIS — Z8544 Personal history of malignant neoplasm of other female genital organs: Secondary | ICD-10-CM | POA: Diagnosis not present

## 2020-01-04 DIAGNOSIS — Z79899 Other long term (current) drug therapy: Secondary | ICD-10-CM | POA: Insufficient documentation

## 2020-01-04 DIAGNOSIS — Z923 Personal history of irradiation: Secondary | ICD-10-CM | POA: Insufficient documentation

## 2020-01-04 DIAGNOSIS — Z08 Encounter for follow-up examination after completed treatment for malignant neoplasm: Secondary | ICD-10-CM | POA: Diagnosis not present

## 2020-01-04 DIAGNOSIS — C541 Malignant neoplasm of endometrium: Secondary | ICD-10-CM

## 2020-01-04 DIAGNOSIS — R5383 Other fatigue: Secondary | ICD-10-CM | POA: Insufficient documentation

## 2020-01-04 DIAGNOSIS — M79606 Pain in leg, unspecified: Secondary | ICD-10-CM | POA: Diagnosis not present

## 2020-01-04 DIAGNOSIS — Z8542 Personal history of malignant neoplasm of other parts of uterus: Secondary | ICD-10-CM | POA: Insufficient documentation

## 2020-01-04 DIAGNOSIS — C7982 Secondary malignant neoplasm of genital organs: Secondary | ICD-10-CM

## 2020-01-08 ENCOUNTER — Ambulatory Visit: Payer: Medicare Other | Admitting: Radiation Oncology

## 2020-02-29 ENCOUNTER — Other Ambulatory Visit: Payer: Self-pay | Admitting: Family Medicine

## 2020-02-29 DIAGNOSIS — R112 Nausea with vomiting, unspecified: Secondary | ICD-10-CM | POA: Diagnosis not present

## 2020-02-29 DIAGNOSIS — E785 Hyperlipidemia, unspecified: Secondary | ICD-10-CM | POA: Diagnosis not present

## 2020-02-29 DIAGNOSIS — E559 Vitamin D deficiency, unspecified: Secondary | ICD-10-CM | POA: Diagnosis not present

## 2020-02-29 DIAGNOSIS — J8410 Pulmonary fibrosis, unspecified: Secondary | ICD-10-CM | POA: Diagnosis not present

## 2020-02-29 DIAGNOSIS — R109 Unspecified abdominal pain: Secondary | ICD-10-CM

## 2020-02-29 DIAGNOSIS — R5383 Other fatigue: Secondary | ICD-10-CM | POA: Diagnosis not present

## 2020-02-29 DIAGNOSIS — D518 Other vitamin B12 deficiency anemias: Secondary | ICD-10-CM | POA: Diagnosis not present

## 2020-02-29 DIAGNOSIS — L720 Epidermal cyst: Secondary | ICD-10-CM | POA: Diagnosis not present

## 2020-02-29 DIAGNOSIS — D649 Anemia, unspecified: Secondary | ICD-10-CM | POA: Diagnosis not present

## 2020-02-29 DIAGNOSIS — E039 Hypothyroidism, unspecified: Secondary | ICD-10-CM | POA: Diagnosis not present

## 2020-03-05 DIAGNOSIS — M8589 Other specified disorders of bone density and structure, multiple sites: Secondary | ICD-10-CM | POA: Diagnosis not present

## 2020-03-05 DIAGNOSIS — K219 Gastro-esophageal reflux disease without esophagitis: Secondary | ICD-10-CM | POA: Diagnosis not present

## 2020-03-05 DIAGNOSIS — E039 Hypothyroidism, unspecified: Secondary | ICD-10-CM | POA: Diagnosis not present

## 2020-03-05 DIAGNOSIS — J449 Chronic obstructive pulmonary disease, unspecified: Secondary | ICD-10-CM | POA: Diagnosis not present

## 2020-03-07 ENCOUNTER — Ambulatory Visit
Admission: RE | Admit: 2020-03-07 | Discharge: 2020-03-07 | Disposition: A | Discharge status: Home / Self Care | Payer: Medicare Other | Source: Ambulatory Visit | Attending: Family Medicine | Admitting: Family Medicine

## 2020-03-07 DIAGNOSIS — N281 Cyst of kidney, acquired: Secondary | ICD-10-CM | POA: Diagnosis not present

## 2020-03-07 DIAGNOSIS — K828 Other specified diseases of gallbladder: Secondary | ICD-10-CM | POA: Diagnosis not present

## 2020-03-07 DIAGNOSIS — R109 Unspecified abdominal pain: Secondary | ICD-10-CM

## 2020-03-07 DIAGNOSIS — R112 Nausea with vomiting, unspecified: Secondary | ICD-10-CM | POA: Diagnosis not present

## 2020-03-18 DIAGNOSIS — D510 Vitamin B12 deficiency anemia due to intrinsic factor deficiency: Secondary | ICD-10-CM | POA: Diagnosis not present

## 2020-03-18 DIAGNOSIS — L720 Epidermal cyst: Secondary | ICD-10-CM | POA: Diagnosis not present

## 2020-03-18 DIAGNOSIS — J8410 Pulmonary fibrosis, unspecified: Secondary | ICD-10-CM | POA: Diagnosis not present

## 2020-03-18 DIAGNOSIS — E039 Hypothyroidism, unspecified: Secondary | ICD-10-CM | POA: Diagnosis not present

## 2020-03-25 DIAGNOSIS — D510 Vitamin B12 deficiency anemia due to intrinsic factor deficiency: Secondary | ICD-10-CM | POA: Diagnosis not present

## 2020-03-25 DIAGNOSIS — L720 Epidermal cyst: Secondary | ICD-10-CM | POA: Diagnosis not present

## 2020-03-25 DIAGNOSIS — E039 Hypothyroidism, unspecified: Secondary | ICD-10-CM | POA: Diagnosis not present

## 2020-03-25 DIAGNOSIS — J8410 Pulmonary fibrosis, unspecified: Secondary | ICD-10-CM | POA: Diagnosis not present

## 2020-03-27 ENCOUNTER — Telehealth: Payer: Self-pay | Admitting: *Deleted

## 2020-03-27 NOTE — Telephone Encounter (Signed)
Shirley from [employer] called and scheduled the patient for a f/u appt in November. She will call the patient

## 2020-03-27 NOTE — Telephone Encounter (Signed)
CALLED PATIENT TO INFORM OF FU WITH DR. Denman George ON 07-03-20 - ARRIVAL TIME- 1:45 PM, SPOKE WITH PATIENT AND SHE IS AWARE OF THIS APPT.

## 2020-03-29 DIAGNOSIS — H4051X3 Glaucoma secondary to other eye disorders, right eye, severe stage: Secondary | ICD-10-CM | POA: Diagnosis not present

## 2020-03-29 DIAGNOSIS — H40022 Open angle with borderline findings, high risk, left eye: Secondary | ICD-10-CM | POA: Diagnosis not present

## 2020-04-01 DIAGNOSIS — J8410 Pulmonary fibrosis, unspecified: Secondary | ICD-10-CM | POA: Diagnosis not present

## 2020-04-01 DIAGNOSIS — E559 Vitamin D deficiency, unspecified: Secondary | ICD-10-CM | POA: Diagnosis not present

## 2020-04-01 DIAGNOSIS — L720 Epidermal cyst: Secondary | ICD-10-CM | POA: Diagnosis not present

## 2020-04-01 DIAGNOSIS — D510 Vitamin B12 deficiency anemia due to intrinsic factor deficiency: Secondary | ICD-10-CM | POA: Diagnosis not present

## 2020-04-08 DIAGNOSIS — F39 Unspecified mood [affective] disorder: Secondary | ICD-10-CM | POA: Diagnosis not present

## 2020-04-08 DIAGNOSIS — J8410 Pulmonary fibrosis, unspecified: Secondary | ICD-10-CM | POA: Diagnosis not present

## 2020-04-08 DIAGNOSIS — L720 Epidermal cyst: Secondary | ICD-10-CM | POA: Diagnosis not present

## 2020-04-08 DIAGNOSIS — G8929 Other chronic pain: Secondary | ICD-10-CM | POA: Diagnosis not present

## 2020-04-08 DIAGNOSIS — R112 Nausea with vomiting, unspecified: Secondary | ICD-10-CM | POA: Diagnosis not present

## 2020-04-08 DIAGNOSIS — R7309 Other abnormal glucose: Secondary | ICD-10-CM | POA: Diagnosis not present

## 2020-04-08 DIAGNOSIS — D510 Vitamin B12 deficiency anemia due to intrinsic factor deficiency: Secondary | ICD-10-CM | POA: Diagnosis not present

## 2020-04-08 DIAGNOSIS — E039 Hypothyroidism, unspecified: Secondary | ICD-10-CM | POA: Diagnosis not present

## 2020-04-08 DIAGNOSIS — E559 Vitamin D deficiency, unspecified: Secondary | ICD-10-CM | POA: Diagnosis not present

## 2020-04-08 DIAGNOSIS — M5432 Sciatica, left side: Secondary | ICD-10-CM | POA: Diagnosis not present

## 2020-05-13 DIAGNOSIS — D510 Vitamin B12 deficiency anemia due to intrinsic factor deficiency: Secondary | ICD-10-CM | POA: Diagnosis not present

## 2020-05-13 DIAGNOSIS — L720 Epidermal cyst: Secondary | ICD-10-CM | POA: Diagnosis not present

## 2020-05-13 DIAGNOSIS — J8410 Pulmonary fibrosis, unspecified: Secondary | ICD-10-CM | POA: Diagnosis not present

## 2020-05-13 DIAGNOSIS — E538 Deficiency of other specified B group vitamins: Secondary | ICD-10-CM | POA: Diagnosis not present

## 2020-05-28 DIAGNOSIS — Z23 Encounter for immunization: Secondary | ICD-10-CM | POA: Diagnosis not present

## 2020-05-28 DIAGNOSIS — J8410 Pulmonary fibrosis, unspecified: Secondary | ICD-10-CM | POA: Diagnosis not present

## 2020-05-28 DIAGNOSIS — D510 Vitamin B12 deficiency anemia due to intrinsic factor deficiency: Secondary | ICD-10-CM | POA: Diagnosis not present

## 2020-05-28 DIAGNOSIS — L720 Epidermal cyst: Secondary | ICD-10-CM | POA: Diagnosis not present

## 2020-05-31 ENCOUNTER — Other Ambulatory Visit: Payer: Self-pay | Admitting: Family Medicine

## 2020-05-31 DIAGNOSIS — R1084 Generalized abdominal pain: Secondary | ICD-10-CM

## 2020-06-13 DIAGNOSIS — J8410 Pulmonary fibrosis, unspecified: Secondary | ICD-10-CM | POA: Diagnosis not present

## 2020-06-13 DIAGNOSIS — L720 Epidermal cyst: Secondary | ICD-10-CM | POA: Diagnosis not present

## 2020-06-13 DIAGNOSIS — E538 Deficiency of other specified B group vitamins: Secondary | ICD-10-CM | POA: Diagnosis not present

## 2020-06-13 DIAGNOSIS — D510 Vitamin B12 deficiency anemia due to intrinsic factor deficiency: Secondary | ICD-10-CM | POA: Diagnosis not present

## 2020-07-03 ENCOUNTER — Ambulatory Visit: Payer: Medicare Other | Admitting: Gynecologic Oncology

## 2020-07-04 ENCOUNTER — Telehealth: Payer: Self-pay | Admitting: *Deleted

## 2020-07-04 NOTE — Telephone Encounter (Signed)
Patient's daughter called and moved her appt from 11/22 to 12/2

## 2020-07-08 ENCOUNTER — Ambulatory Visit: Payer: Medicare Other | Admitting: Gynecologic Oncology

## 2020-07-17 ENCOUNTER — Encounter: Payer: Self-pay | Admitting: Gynecologic Oncology

## 2020-07-18 ENCOUNTER — Other Ambulatory Visit: Payer: Self-pay

## 2020-07-18 ENCOUNTER — Inpatient Hospital Stay: Payer: Medicare Other | Attending: Gynecologic Oncology | Admitting: Gynecologic Oncology

## 2020-07-18 VITALS — BP 137/64 | HR 75 | Temp 97.5°F | Resp 18 | Ht 63.0 in | Wt 119.0 lb

## 2020-07-18 DIAGNOSIS — E039 Hypothyroidism, unspecified: Secondary | ICD-10-CM | POA: Diagnosis not present

## 2020-07-18 DIAGNOSIS — Z90722 Acquired absence of ovaries, bilateral: Secondary | ICD-10-CM | POA: Insufficient documentation

## 2020-07-18 DIAGNOSIS — Z90710 Acquired absence of both cervix and uterus: Secondary | ICD-10-CM | POA: Insufficient documentation

## 2020-07-18 DIAGNOSIS — C541 Malignant neoplasm of endometrium: Secondary | ICD-10-CM

## 2020-07-18 DIAGNOSIS — K219 Gastro-esophageal reflux disease without esophagitis: Secondary | ICD-10-CM | POA: Diagnosis not present

## 2020-07-18 DIAGNOSIS — J8410 Pulmonary fibrosis, unspecified: Secondary | ICD-10-CM | POA: Diagnosis not present

## 2020-07-18 DIAGNOSIS — Z79899 Other long term (current) drug therapy: Secondary | ICD-10-CM | POA: Diagnosis not present

## 2020-07-18 DIAGNOSIS — Z923 Personal history of irradiation: Secondary | ICD-10-CM | POA: Insufficient documentation

## 2020-07-18 NOTE — Patient Instructions (Signed)
Please notify Dr Denman George at phone number [phone] if you notice vaginal bleeding, new pelvic or abdominal pains, bloating, feeling full easy, or a change in bladder or bowel function.    Please return to see Dr Sondra Come in 6 months.  Please have Dr Clabe Seal office contact Dr Serita Grit office (at [phone]) in May after your appointment with him to request an appointment with Dr Denman George for December, 2022.

## 2020-07-18 NOTE — Progress Notes (Signed)
Follow-up Note: Gyn-Onc  Consult was requested by Dr. Ulanda Edison for the evaluation of Tanya Martinez 86 y.o. female  CC:  Chief Complaint  Patient presents with   Endometrial cancer, grade I [facility])    Assessment/Plan:  Tanya Martinez  is a 86 y.o.  year old with history of recurrent endometrial carcinoma (grade 1) at the distal vagina and bladder dome treated with transurethral resection and whole pelvic RT completed 11/03/17.  She has had an enduring complete clinical response to therapy.  I recommend she follow-up with Dr Sondra Come in 6 months and myself in December, 2022.   If relapse develops, would consider hormonal therapy.   HPI: Tanya Martinez is a 86 year old P1 who is seen in consultation at the request of Dr Daiva Huge for endometrial cancer recurrence.  The patient has a remote history of a TAH BSO in 1989 with Dr Ulanda Edison for grade 1, stage IA (clinical staging) endometrial cancer.  She was managed expectantly and did well.  In mid October, 2018 she noticed vaginal bleeding and saw Dr Ulanda Edison. On his exam a polypoid outgrowth was noted in the distal third of the left vaginal sidewall. Her vagina is substantially narrowed from atrophy and exam was difficult. The lesion broke off spontaneously with no gross residual remaining. It was sent for pathology which revealed a grade 1 endometrioid adenocarcinoma.   The patient is fairly healthy despite her advanced age. She has pulmonary fibrosis. She has had 1 cesarean section.  PET on 07/22/17 showed a 3cm hypermetabolic pelvic mass involving the posterior dome of the urinary bladder and adjacent fat consistent with recurrent endometrial carcinoma.   She underwent a transurethral resection of the bladder mass with Dr Tresa Moore on 08/25/17.  Final pathology revealed adenocarcinoma with squamous differentiation consistent with endometrial primary.   She then went on to complete whole pelvic XRT: Radiation treatment dates:   09/29/2017 -  11/03/2017 Site/dose:   Pelvis/ 45 Gy delivered in 25 fractions of 1.8 Gy  She had substantial GU and GI toxicity with radiation.   She had a complete clinical response.   Interval Hx.  In March, 2020 she developed pelvic fractures. Imaging did not show evidence of recurrent disease and these were felt to not be secondary to radiation changes.   They were treated conservatively and she is now doing well. She is continuing to gain weight.  Current Meds:  Outpatient Encounter Medications as of 07/18/2020  Medication Sig   Calcium Carbonate-Vit D-Min (CALTRATE 600+D PLUS MINERALS) 600-800 MG-UNIT TABS Take 1 tablet by mouth daily.    cyanocobalamin (,VITAMIN B-12,) 1000 MCG/ML injection Inject 1,000 mcg into the muscle every 30 (thirty) days.    famotidine (PEPCID) 40 MG tablet Take 40 mg by mouth daily.   gabapentin (NEURONTIN) 100 MG capsule Take 300 mg by mouth at bedtime.    latanoprost (XALATAN) 0.005 % ophthalmic solution Place 1 drop into both eyes at bedtime.    levothyroxine (SYNTHROID, LEVOTHROID) 50 MCG tablet Take 50 mcg by mouth daily before breakfast.    mirtazapine (REMERON) 15 MG tablet Take 15 mg by mouth at bedtime.    UNABLE TO FIND as needed. Hemp cream   acetaminophen (TYLENOL) 325 MG tablet Take 2 tablets (650 mg total) by mouth every 6 (six) hours as needed for mild pain. (Patient not taking: Reported on 07/17/2020)   albuterol (PROVENTIL HFA;VENTOLIN HFA) 108 (90 Base) MCG/ACT inhaler Inhale 1-2 puffs into the lungs every 4 (four) hours as needed for  wheezing or shortness of breath. (Patient not taking: Reported on 07/17/2020)   [DISCONTINUED] omeprazole (PRILOSEC OTC) 20 MG tablet Take 20 mg by mouth daily.   [DISCONTINUED] Prenatal Vit-Fe Fumarate-FA (M-NATAL PLUS) 27-1 MG TABS Take 1 tablet by mouth daily.   No facility-administered encounter medications on file as of 07/18/2020.    Allergy:  Allergies  Allergen Reactions   Nystatin-Triamcinolone  Other (See Comments)    Redness, burning in the external vaginal area, broke out all over bottom and vaginal external area.   Azithromycin     GI Upset (intolerance)   Shellfish Allergy Nausea Only   Sulfa Antibiotics Other (See Comments)    "Lips felt numb"    Social Hx:   Social History   Socioeconomic History   Marital status: Widowed    Spouse name: Not on file   Number of children: 1   Years of education: Not on file   Highest education level: Not on file  Occupational History   Occupation: retired  Tobacco Use   Smoking status: Never Smoker   Smokeless tobacco: Never Used  Scientific laboratory technician Use: Never used  Substance and Sexual Activity   Alcohol use: Yes    Comment: occasionally   Drug use: No   Sexual activity: Not Currently  Other Topics Concern   Not on file  Social History Narrative   Not on file   Social Determinants of Health   Financial Resource Strain:    Difficulty of Paying Living Expenses: Not on file  Food Insecurity:    Worried About Charity fundraiser in the Last Year: Not on file   [place] in the Last Year: Not on file  Transportation Needs:    Lack of Transportation (Medical): Not on file   Lack of Transportation (Non-Medical): Not on file  Physical Activity:    Days of Exercise per Week: Not on file   Minutes of Exercise per Session: Not on file  Stress:    Feeling of Stress : Not on file  Social Connections:    Frequency of Communication with Friends and Family: Not on file   Frequency of Social Gatherings with Friends and Family: Not on file   Attends Religious Services: Not on file   Active Member of Clubs or Organizations: Not on file   Attends Archivist Meetings: Not on file   Marital Status: Not on file  Intimate Partner Violence:    Fear of Current or Ex-Partner: Not on file   Emotionally Abused: Not on file   Physically Abused: Not on file   Sexually Abused: Not on file     Past Surgical Hx:  Past Surgical History:  Procedure Laterality Date   BREAST LUMPECTOMY     2 lumps removed from rt breast   CESAREAN SECTION     COLONOSCOPY     CYSTOSCOPY W/ RETROGRADES Bilateral 08/25/2017   Procedure: CYSTOSCOPY WITH BILATERAL RETROGRADE PYELOGRAM;  Surgeon: Alexis Frock, MD;  Location: [facility];  Service: Urology;  Laterality: Bilateral;   EYE SURGERY     detached retina   HEMORRHOID SURGERY     HYSTERECTOMY ABDOMINAL WITH SALPINGECTOMY  1989   THYROID SURGERY     growth removed   TRANSURETHRAL RESECTION OF BLADDER TUMOR N/A 08/25/2017   Procedure: TRANSURETHRAL RESECTION OF BLADDER TUMOR (TURBT);  Surgeon: Alexis Frock, MD;  Location: [facility];  Service: Urology;  Laterality: N/A;    Past Medical Hx:  Past Medical  History:  Diagnosis Date   Acid reflux    Colon polyps    Glaucoma    History of radiation therapy 09/29/17-11/03/17   45 Gy to the pelvis in 25 fractions   Hypothyroidism    Pneumonia    Uterine cancer [facility])     Past Gynecological History:  C/s x 1, history of stage I endometrial cancer in 1989. No LMP recorded. Patient is postmenopausal.  Family Hx:  Family History  Problem Relation Age of Onset   Heart attack Mother    Heart disease Mother    Heart attack Brother    Heart disease Brother    Heart attack Sister    Heart disease Sister    Stroke Father     Review of Systems:  Constitutional  Feels well,    ENT Normal appearing ears and nares bilaterally Skin/Breast  No rash, sores, jaundice, itching, dryness Cardiovascular  No chest pain, shortness of breath, or edema  Pulmonary  No cough or wheeze.  Gastro Intestinal  No nausea, vomitting, or diarrhoea. No bright red blood per rectum, no abdominal pain, change in bowel movement, or constipation.  Genito Urinary  No frequency, urgency, dysuria, no vaginal spotting, + vulvar irritation. Musculo Skeletal  No myalgia, arthralgia, joint swelling  or pain  Neurologic  No weakness, numbness, change in gait,  Psychology  No depression, anxiety, insomnia.   Vitals:  Blood pressure 137/64, pulse 75, temperature (!) 97.5 F (36.4 C), temperature source Tympanic, resp. rate 18, height 5\' 3"  (1.6 m), weight 119 lb (54 kg), SpO2 99 %.  Physical Exam: WD in NAD Neck  Supple NROM, without any enlargements.  Lymph Node Survey No cervical supraclavicular or inguinal adenopathy Cardiovascular  Pulse normal rate, regularity and rhythm. S1 and S2 normal.  Lungs  Clear to auscultation bilateraly, without wheezes/crackles/rhonchi. Good air movement.  Skin  No rash/lesions/breakdown  Psychiatry  Alert and oriented to person, place, and time  Abdomen  Normoactive bowel sounds, abdomen soft, non-tender and thin without evidence of hernia.  Back No CVA tenderness Genito Urinary  Vulva/vagina: radiation changes and wet desquamation to labia majora. No cellulitis.   Bladder/urethra:  No lesions or masses, well supported bladder  Vagina: very narrow introitus, accomodates narrow speculum and single digit on exam. No visible or palpable residual disease on left sidewall.   Cervix and uterus surgically absent  Adnexa: no palpable masses. Rectal  deferred Extremities  No bilateral cyanosis, clubbing or edema.   Thereasa Solo, MD  07/18/2020, 3:26 PM

## 2020-08-06 DIAGNOSIS — Z23 Encounter for immunization: Secondary | ICD-10-CM | POA: Diagnosis not present

## 2020-09-09 DIAGNOSIS — M169 Osteoarthritis of hip, unspecified: Secondary | ICD-10-CM | POA: Diagnosis not present

## 2020-09-09 DIAGNOSIS — E039 Hypothyroidism, unspecified: Secondary | ICD-10-CM | POA: Diagnosis not present

## 2020-09-09 DIAGNOSIS — D689 Coagulation defect, unspecified: Secondary | ICD-10-CM | POA: Diagnosis not present

## 2020-09-09 DIAGNOSIS — I10 Essential (primary) hypertension: Secondary | ICD-10-CM | POA: Diagnosis not present

## 2020-10-14 DIAGNOSIS — H40022 Open angle with borderline findings, high risk, left eye: Secondary | ICD-10-CM | POA: Diagnosis not present

## 2020-10-14 DIAGNOSIS — Z961 Presence of intraocular lens: Secondary | ICD-10-CM | POA: Diagnosis not present

## 2020-10-14 DIAGNOSIS — H4051X3 Glaucoma secondary to other eye disorders, right eye, severe stage: Secondary | ICD-10-CM | POA: Diagnosis not present

## 2020-10-17 DIAGNOSIS — F39 Unspecified mood [affective] disorder: Secondary | ICD-10-CM | POA: Diagnosis not present

## 2020-10-17 DIAGNOSIS — E559 Vitamin D deficiency, unspecified: Secondary | ICD-10-CM | POA: Diagnosis not present

## 2020-10-17 DIAGNOSIS — D510 Vitamin B12 deficiency anemia due to intrinsic factor deficiency: Secondary | ICD-10-CM | POA: Diagnosis not present

## 2020-10-17 DIAGNOSIS — J8410 Pulmonary fibrosis, unspecified: Secondary | ICD-10-CM | POA: Diagnosis not present

## 2020-10-17 DIAGNOSIS — E039 Hypothyroidism, unspecified: Secondary | ICD-10-CM | POA: Diagnosis not present

## 2020-10-17 DIAGNOSIS — Z0001 Encounter for general adult medical examination with abnormal findings: Secondary | ICD-10-CM | POA: Diagnosis not present

## 2020-10-17 DIAGNOSIS — E538 Deficiency of other specified B group vitamins: Secondary | ICD-10-CM | POA: Diagnosis not present

## 2020-10-17 DIAGNOSIS — Z0000 Encounter for general adult medical examination without abnormal findings: Secondary | ICD-10-CM | POA: Diagnosis not present

## 2020-10-17 DIAGNOSIS — R600 Localized edema: Secondary | ICD-10-CM | POA: Diagnosis not present

## 2020-10-17 DIAGNOSIS — R7301 Impaired fasting glucose: Secondary | ICD-10-CM | POA: Diagnosis not present

## 2020-12-13 DIAGNOSIS — H6122 Impacted cerumen, left ear: Secondary | ICD-10-CM | POA: Diagnosis not present

## 2020-12-13 DIAGNOSIS — M79605 Pain in left leg: Secondary | ICD-10-CM | POA: Diagnosis not present

## 2020-12-13 DIAGNOSIS — M810 Age-related osteoporosis without current pathological fracture: Secondary | ICD-10-CM | POA: Diagnosis not present

## 2020-12-13 DIAGNOSIS — W19XXXA Unspecified fall, initial encounter: Secondary | ICD-10-CM | POA: Diagnosis not present

## 2020-12-13 DIAGNOSIS — S0990XA Unspecified injury of head, initial encounter: Secondary | ICD-10-CM | POA: Diagnosis not present

## 2020-12-16 DIAGNOSIS — H4051X3 Glaucoma secondary to other eye disorders, right eye, severe stage: Secondary | ICD-10-CM | POA: Diagnosis not present

## 2020-12-17 DIAGNOSIS — M810 Age-related osteoporosis without current pathological fracture: Secondary | ICD-10-CM | POA: Diagnosis not present

## 2020-12-17 DIAGNOSIS — H6122 Impacted cerumen, left ear: Secondary | ICD-10-CM | POA: Diagnosis not present

## 2020-12-17 DIAGNOSIS — R42 Dizziness and giddiness: Secondary | ICD-10-CM | POA: Diagnosis not present

## 2020-12-17 DIAGNOSIS — S8992XD Unspecified injury of left lower leg, subsequent encounter: Secondary | ICD-10-CM | POA: Diagnosis not present

## 2020-12-17 DIAGNOSIS — E039 Hypothyroidism, unspecified: Secondary | ICD-10-CM | POA: Diagnosis not present

## 2020-12-17 DIAGNOSIS — I951 Orthostatic hypotension: Secondary | ICD-10-CM | POA: Diagnosis not present

## 2020-12-17 DIAGNOSIS — Z9181 History of falling: Secondary | ICD-10-CM | POA: Diagnosis not present

## 2020-12-17 DIAGNOSIS — M5442 Lumbago with sciatica, left side: Secondary | ICD-10-CM | POA: Diagnosis not present

## 2020-12-17 DIAGNOSIS — S069X0D Unspecified intracranial injury without loss of consciousness, subsequent encounter: Secondary | ICD-10-CM | POA: Diagnosis not present

## 2020-12-17 DIAGNOSIS — F39 Unspecified mood [affective] disorder: Secondary | ICD-10-CM | POA: Diagnosis not present

## 2020-12-17 DIAGNOSIS — F339 Major depressive disorder, recurrent, unspecified: Secondary | ICD-10-CM | POA: Diagnosis not present

## 2020-12-17 DIAGNOSIS — J8410 Pulmonary fibrosis, unspecified: Secondary | ICD-10-CM | POA: Diagnosis not present

## 2020-12-17 DIAGNOSIS — G8929 Other chronic pain: Secondary | ICD-10-CM | POA: Diagnosis not present

## 2020-12-17 DIAGNOSIS — D510 Vitamin B12 deficiency anemia due to intrinsic factor deficiency: Secondary | ICD-10-CM | POA: Diagnosis not present

## 2020-12-17 DIAGNOSIS — K219 Gastro-esophageal reflux disease without esophagitis: Secondary | ICD-10-CM | POA: Diagnosis not present

## 2020-12-27 DIAGNOSIS — M810 Age-related osteoporosis without current pathological fracture: Secondary | ICD-10-CM | POA: Diagnosis not present

## 2020-12-27 DIAGNOSIS — M5442 Lumbago with sciatica, left side: Secondary | ICD-10-CM | POA: Diagnosis not present

## 2020-12-27 DIAGNOSIS — D510 Vitamin B12 deficiency anemia due to intrinsic factor deficiency: Secondary | ICD-10-CM | POA: Diagnosis not present

## 2020-12-27 DIAGNOSIS — S069X0D Unspecified intracranial injury without loss of consciousness, subsequent encounter: Secondary | ICD-10-CM | POA: Diagnosis not present

## 2020-12-27 DIAGNOSIS — S8992XD Unspecified injury of left lower leg, subsequent encounter: Secondary | ICD-10-CM | POA: Diagnosis not present

## 2020-12-27 DIAGNOSIS — G8929 Other chronic pain: Secondary | ICD-10-CM | POA: Diagnosis not present

## 2020-12-30 DIAGNOSIS — M810 Age-related osteoporosis without current pathological fracture: Secondary | ICD-10-CM | POA: Diagnosis not present

## 2020-12-30 DIAGNOSIS — S8992XD Unspecified injury of left lower leg, subsequent encounter: Secondary | ICD-10-CM | POA: Diagnosis not present

## 2020-12-30 DIAGNOSIS — D510 Vitamin B12 deficiency anemia due to intrinsic factor deficiency: Secondary | ICD-10-CM | POA: Diagnosis not present

## 2020-12-30 DIAGNOSIS — S069X0D Unspecified intracranial injury without loss of consciousness, subsequent encounter: Secondary | ICD-10-CM | POA: Diagnosis not present

## 2020-12-30 DIAGNOSIS — G8929 Other chronic pain: Secondary | ICD-10-CM | POA: Diagnosis not present

## 2020-12-30 DIAGNOSIS — M5442 Lumbago with sciatica, left side: Secondary | ICD-10-CM | POA: Diagnosis not present

## 2021-01-01 DIAGNOSIS — S069X0D Unspecified intracranial injury without loss of consciousness, subsequent encounter: Secondary | ICD-10-CM | POA: Diagnosis not present

## 2021-01-01 DIAGNOSIS — G8929 Other chronic pain: Secondary | ICD-10-CM | POA: Diagnosis not present

## 2021-01-01 DIAGNOSIS — S8992XD Unspecified injury of left lower leg, subsequent encounter: Secondary | ICD-10-CM | POA: Diagnosis not present

## 2021-01-01 DIAGNOSIS — D510 Vitamin B12 deficiency anemia due to intrinsic factor deficiency: Secondary | ICD-10-CM | POA: Diagnosis not present

## 2021-01-01 DIAGNOSIS — M810 Age-related osteoporosis without current pathological fracture: Secondary | ICD-10-CM | POA: Diagnosis not present

## 2021-01-01 DIAGNOSIS — M5442 Lumbago with sciatica, left side: Secondary | ICD-10-CM | POA: Diagnosis not present

## 2021-01-03 DIAGNOSIS — M5442 Lumbago with sciatica, left side: Secondary | ICD-10-CM | POA: Diagnosis not present

## 2021-01-03 DIAGNOSIS — M810 Age-related osteoporosis without current pathological fracture: Secondary | ICD-10-CM | POA: Diagnosis not present

## 2021-01-03 DIAGNOSIS — D510 Vitamin B12 deficiency anemia due to intrinsic factor deficiency: Secondary | ICD-10-CM | POA: Diagnosis not present

## 2021-01-03 DIAGNOSIS — S069X0D Unspecified intracranial injury without loss of consciousness, subsequent encounter: Secondary | ICD-10-CM | POA: Diagnosis not present

## 2021-01-03 DIAGNOSIS — S8992XD Unspecified injury of left lower leg, subsequent encounter: Secondary | ICD-10-CM | POA: Diagnosis not present

## 2021-01-03 DIAGNOSIS — G8929 Other chronic pain: Secondary | ICD-10-CM | POA: Diagnosis not present

## 2021-01-06 DIAGNOSIS — M810 Age-related osteoporosis without current pathological fracture: Secondary | ICD-10-CM | POA: Diagnosis not present

## 2021-01-06 DIAGNOSIS — G8929 Other chronic pain: Secondary | ICD-10-CM | POA: Diagnosis not present

## 2021-01-06 DIAGNOSIS — M5442 Lumbago with sciatica, left side: Secondary | ICD-10-CM | POA: Diagnosis not present

## 2021-01-06 DIAGNOSIS — D510 Vitamin B12 deficiency anemia due to intrinsic factor deficiency: Secondary | ICD-10-CM | POA: Diagnosis not present

## 2021-01-06 DIAGNOSIS — S8992XD Unspecified injury of left lower leg, subsequent encounter: Secondary | ICD-10-CM | POA: Diagnosis not present

## 2021-01-06 DIAGNOSIS — S069X0D Unspecified intracranial injury without loss of consciousness, subsequent encounter: Secondary | ICD-10-CM | POA: Diagnosis not present

## 2021-01-08 DIAGNOSIS — S8992XD Unspecified injury of left lower leg, subsequent encounter: Secondary | ICD-10-CM | POA: Diagnosis not present

## 2021-01-08 DIAGNOSIS — S069X0D Unspecified intracranial injury without loss of consciousness, subsequent encounter: Secondary | ICD-10-CM | POA: Diagnosis not present

## 2021-01-08 DIAGNOSIS — G8929 Other chronic pain: Secondary | ICD-10-CM | POA: Diagnosis not present

## 2021-01-08 DIAGNOSIS — M5442 Lumbago with sciatica, left side: Secondary | ICD-10-CM | POA: Diagnosis not present

## 2021-01-08 DIAGNOSIS — M810 Age-related osteoporosis without current pathological fracture: Secondary | ICD-10-CM | POA: Diagnosis not present

## 2021-01-08 DIAGNOSIS — D510 Vitamin B12 deficiency anemia due to intrinsic factor deficiency: Secondary | ICD-10-CM | POA: Diagnosis not present

## 2021-01-08 NOTE — Progress Notes (Signed)
Radiation Oncology         [phone] ________________________________  Name: Tanya Martinez MRN: [id number]  Date: 01/09/2021  DOB: 03-01-1923  Follow-Up Visit Note  CC: Alroy Dust, L.Marlou Sa, MD  Everitt Amber, MD    ICD-10-CM   1. Recurrent carcinoma of endometrium ([place])  C54.1   2. Secondary malignant neoplasm of vagina ([facility])  C79.82     Diagnosis:   Recurrent endometrial carcinoma (grade 1) at themid vaginaarea as well as posterior dome of the bladder  Interval Since Last Radiation:  3 years, 2 months, 6 days    Radiation treatment dates: 09/29/2017 - 11/03/2017  Dose/rate: Pelvis / 45 Gy delivered in 25 fractions of 1.8 Gy, the patient was unable to tolerate vaginal brachytherapy  Narrative:  The patient returns today for routine follow-up.  The patient was last seen for follow-up on 01/04/20.                 The patient was referred to Dr. Denman George for consult on 07/18/20 by Dr. Ulanda Edison, during which she was referred to radiation oncology.  She did see Dr. Denman George approximately 6 months ago with the patient being in clinical remission at that time.  Patient has had no additional imaging done since last follow-up. Of note: the patient had multiple visits with Dr. Darron Doom regarding her hypothyroidism in June and August of 2021.  Patient also reports problems with dizziness and is undergoing physical therapy concerning this issue.  Primarily occurs when she is standing.                  Allergies:  is allergic to nystatin-triamcinolone, azithromycin, shellfish allergy, and sulfa antibiotics.  Meds: Current Outpatient Medications  Medication Sig Dispense Refill  . albuterol (PROVENTIL HFA;VENTOLIN HFA) 108 (90 Base) MCG/ACT inhaler Inhale 1-2 puffs into the lungs every 4 (four) hours as needed for wheezing or shortness of breath.    . Calcium Carbonate-Vit D-Min (CALTRATE 600+D PLUS MINERALS) 600-800 MG-UNIT TABS Take 1 tablet by mouth daily.     Marland Kitchen gabapentin (NEURONTIN) 100 MG  capsule Take 300 mg by mouth at bedtime.     Marland Kitchen latanoprost (XALATAN) 0.005 % ophthalmic solution Place 1 drop into both eyes at bedtime.     Marland Kitchen levothyroxine (SYNTHROID, LEVOTHROID) 50 MCG tablet Take 50 mcg by mouth daily before breakfast.    . mirtazapine (REMERON) 15 MG tablet Take 15 mg by mouth at bedtime.     Marland Kitchen UNABLE TO FIND as needed. Hemp cream    . acetaminophen (TYLENOL) 325 MG tablet Take 2 tablets (650 mg total) by mouth every 6 (six) hours as needed for mild pain. (Patient not taking: No sig reported)    . cyanocobalamin (,VITAMIN B-12,) 1000 MCG/ML injection Inject 1,000 mcg into the muscle every 30 (thirty) days.  (Patient not taking: Reported on 01/09/2021)    . famotidine (PEPCID) 40 MG tablet Take 40 mg by mouth daily. (Patient not taking: Reported on 01/09/2021)     No current facility-administered medications for this encounter.    Physical Findings: The patient is in no acute distress. Patient is alert and oriented.  height is 5\' 4"  (1.626 m) and weight is 117 lb 12.8 oz (53.4 kg). Her temperature is 97.8 F (36.6 C). Her blood pressure is 129/73 and her pulse is 71. Her respiration is 20 and oxygen saturation is 99%. .   Lungs are clear to auscultation bilaterally. Heart has regular rate and rhythm. No palpable cervical, supraclavicular, or  axillary adenopathy. Abdomen soft, non-tender, normal bowel sounds.  On pelvic examination the external genitalia are unremarkable.  Pediatric speculum was used for the exam.  No mucosal lesions noted in the vaginal vault.  I had difficulty reaching the vaginal cuff in light of the patient's intolerance of the exam. Examine would only permit use of a pinky finger for the exam.  She could not tolerate index finger.   Lab Findings: Lab Results  Component Value Date   WBC 5.1 09/28/2018   HGB 11.9 (L) 09/28/2018   HCT 36.7 09/28/2018   MCV 101.7 (H) 09/28/2018   PLT 162 09/28/2018    Radiographic Findings: No results  found.  Impression:   Recurrent endometrial carcinoma (grade 1) at themid vaginaarea as well as posterior dome of the bladderstatus post external beam radiation treatments only  No evidence of recurrence on clinical exam.   Plan: Patient will follow-up with Dr. Denman George in 6 months and radiation oncology in 1 year.  Given her age of almost 38 the patient is contemplating discontinuing these exams. she will call Dr. Serita Grit office if she decides not to proceed with this follow-up.  She knows she can call at anytime for new issues.   25 minutes of total time was spent for this patient encounter, including preparation, face-to-face counseling with the patient and coordination of care, physical exam, and documentation of the encounter. ____________________________________  Blair Promise, PhD, MD   This document serves as a record of services personally performed by Gery Pray, MD. It was created on his behalf by Roney Mans, a trained medical scribe. The creation of this record is based on the scribe's personal observations and the provider's statements to them. This document has been checked and approved by the attending provider.

## 2021-01-09 ENCOUNTER — Ambulatory Visit
Admission: RE | Admit: 2021-01-09 | Discharge: 2021-01-09 | Disposition: A | Discharge status: Home / Self Care | Payer: Medicare Other | Source: Ambulatory Visit | Attending: Radiation Oncology | Admitting: Radiation Oncology

## 2021-01-09 ENCOUNTER — Other Ambulatory Visit: Payer: Self-pay

## 2021-01-09 ENCOUNTER — Encounter: Payer: Self-pay | Admitting: Radiation Oncology

## 2021-01-09 VITALS — BP 129/73 | HR 71 | Temp 97.8°F | Resp 20 | Ht 64.0 in | Wt 117.8 lb

## 2021-01-09 DIAGNOSIS — C7982 Secondary malignant neoplasm of genital organs: Secondary | ICD-10-CM | POA: Diagnosis not present

## 2021-01-09 DIAGNOSIS — Z8544 Personal history of malignant neoplasm of other female genital organs: Secondary | ICD-10-CM | POA: Diagnosis not present

## 2021-01-09 DIAGNOSIS — E039 Hypothyroidism, unspecified: Secondary | ICD-10-CM | POA: Diagnosis not present

## 2021-01-09 DIAGNOSIS — Z923 Personal history of irradiation: Secondary | ICD-10-CM | POA: Insufficient documentation

## 2021-01-09 DIAGNOSIS — C541 Malignant neoplasm of endometrium: Secondary | ICD-10-CM | POA: Insufficient documentation

## 2021-01-09 DIAGNOSIS — Z08 Encounter for follow-up examination after completed treatment for malignant neoplasm: Secondary | ICD-10-CM | POA: Diagnosis not present

## 2021-01-09 DIAGNOSIS — Z8542 Personal history of malignant neoplasm of other parts of uterus: Secondary | ICD-10-CM | POA: Diagnosis not present

## 2021-01-09 NOTE — Progress Notes (Signed)
Tanya Martinez is here today for follow up post radiation to the pelvic.  They completed their radiation on: 11/03/2017   Does the patient complain of any of the following:  . Pain: denies . Abdominal bloating: denies . Diarrhea/Constipation: denies diarrhea, reports constipation . Nausea/Vomiting: denies . Vaginal Discharge: denies . Blood in Urine or Stool: denies . Urinary Issues (dysuria/incomplete emptying/ incontinence/ increased frequency/urgency): reports frequency, nocturia x 7-8 times nightly, occasional incontinence, urgency. Denies dysuria or incomplete emptying . Does patient report using vaginal dilator 2-3 times a week and/or sexually active 2-3 weeks: no longer uses dilator . Post radiation skin changes: denies   Additional comments if applicable: experiencing lightheadedness and dizziness, feels off balance at times  Vitals:   01/09/21 1011  BP: 129/73  Pulse: 71  Resp: 20  Temp: 97.8 F (36.6 C)  SpO2: 99%  Weight: 117 lb 12.8 oz (53.4 kg)  Height: 5\' 4"  (1.626 m)

## 2021-01-10 DIAGNOSIS — S8992XD Unspecified injury of left lower leg, subsequent encounter: Secondary | ICD-10-CM | POA: Diagnosis not present

## 2021-01-10 DIAGNOSIS — D510 Vitamin B12 deficiency anemia due to intrinsic factor deficiency: Secondary | ICD-10-CM | POA: Diagnosis not present

## 2021-01-10 DIAGNOSIS — S069X0D Unspecified intracranial injury without loss of consciousness, subsequent encounter: Secondary | ICD-10-CM | POA: Diagnosis not present

## 2021-01-10 DIAGNOSIS — G8929 Other chronic pain: Secondary | ICD-10-CM | POA: Diagnosis not present

## 2021-01-10 DIAGNOSIS — M5442 Lumbago with sciatica, left side: Secondary | ICD-10-CM | POA: Diagnosis not present

## 2021-01-10 DIAGNOSIS — M810 Age-related osteoporosis without current pathological fracture: Secondary | ICD-10-CM | POA: Diagnosis not present

## 2021-01-14 DIAGNOSIS — D510 Vitamin B12 deficiency anemia due to intrinsic factor deficiency: Secondary | ICD-10-CM | POA: Diagnosis not present

## 2021-01-14 DIAGNOSIS — G8929 Other chronic pain: Secondary | ICD-10-CM | POA: Diagnosis not present

## 2021-01-14 DIAGNOSIS — S8992XD Unspecified injury of left lower leg, subsequent encounter: Secondary | ICD-10-CM | POA: Diagnosis not present

## 2021-01-14 DIAGNOSIS — S069X0D Unspecified intracranial injury without loss of consciousness, subsequent encounter: Secondary | ICD-10-CM | POA: Diagnosis not present

## 2021-01-14 DIAGNOSIS — M5442 Lumbago with sciatica, left side: Secondary | ICD-10-CM | POA: Diagnosis not present

## 2021-01-14 DIAGNOSIS — M810 Age-related osteoporosis without current pathological fracture: Secondary | ICD-10-CM | POA: Diagnosis not present

## 2021-01-16 DIAGNOSIS — J8410 Pulmonary fibrosis, unspecified: Secondary | ICD-10-CM | POA: Diagnosis not present

## 2021-01-16 DIAGNOSIS — Z9181 History of falling: Secondary | ICD-10-CM | POA: Diagnosis not present

## 2021-01-16 DIAGNOSIS — E039 Hypothyroidism, unspecified: Secondary | ICD-10-CM | POA: Diagnosis not present

## 2021-01-16 DIAGNOSIS — F339 Major depressive disorder, recurrent, unspecified: Secondary | ICD-10-CM | POA: Diagnosis not present

## 2021-01-16 DIAGNOSIS — D510 Vitamin B12 deficiency anemia due to intrinsic factor deficiency: Secondary | ICD-10-CM | POA: Diagnosis not present

## 2021-01-16 DIAGNOSIS — R42 Dizziness and giddiness: Secondary | ICD-10-CM | POA: Diagnosis not present

## 2021-01-16 DIAGNOSIS — F39 Unspecified mood [affective] disorder: Secondary | ICD-10-CM | POA: Diagnosis not present

## 2021-01-16 DIAGNOSIS — S8992XD Unspecified injury of left lower leg, subsequent encounter: Secondary | ICD-10-CM | POA: Diagnosis not present

## 2021-01-16 DIAGNOSIS — H6122 Impacted cerumen, left ear: Secondary | ICD-10-CM | POA: Diagnosis not present

## 2021-01-16 DIAGNOSIS — M5442 Lumbago with sciatica, left side: Secondary | ICD-10-CM | POA: Diagnosis not present

## 2021-01-16 DIAGNOSIS — K219 Gastro-esophageal reflux disease without esophagitis: Secondary | ICD-10-CM | POA: Diagnosis not present

## 2021-01-16 DIAGNOSIS — I951 Orthostatic hypotension: Secondary | ICD-10-CM | POA: Diagnosis not present

## 2021-01-16 DIAGNOSIS — M810 Age-related osteoporosis without current pathological fracture: Secondary | ICD-10-CM | POA: Diagnosis not present

## 2021-01-16 DIAGNOSIS — G8929 Other chronic pain: Secondary | ICD-10-CM | POA: Diagnosis not present

## 2021-01-16 DIAGNOSIS — S069X0D Unspecified intracranial injury without loss of consciousness, subsequent encounter: Secondary | ICD-10-CM | POA: Diagnosis not present

## 2021-01-17 DIAGNOSIS — D510 Vitamin B12 deficiency anemia due to intrinsic factor deficiency: Secondary | ICD-10-CM | POA: Diagnosis not present

## 2021-01-17 DIAGNOSIS — S8992XD Unspecified injury of left lower leg, subsequent encounter: Secondary | ICD-10-CM | POA: Diagnosis not present

## 2021-01-17 DIAGNOSIS — S069X0D Unspecified intracranial injury without loss of consciousness, subsequent encounter: Secondary | ICD-10-CM | POA: Diagnosis not present

## 2021-01-17 DIAGNOSIS — M810 Age-related osteoporosis without current pathological fracture: Secondary | ICD-10-CM | POA: Diagnosis not present

## 2021-01-17 DIAGNOSIS — G8929 Other chronic pain: Secondary | ICD-10-CM | POA: Diagnosis not present

## 2021-01-17 DIAGNOSIS — M5442 Lumbago with sciatica, left side: Secondary | ICD-10-CM | POA: Diagnosis not present

## 2021-01-20 DIAGNOSIS — E039 Hypothyroidism, unspecified: Secondary | ICD-10-CM | POA: Diagnosis not present

## 2021-01-20 DIAGNOSIS — R42 Dizziness and giddiness: Secondary | ICD-10-CM | POA: Diagnosis not present

## 2021-01-20 DIAGNOSIS — R296 Repeated falls: Secondary | ICD-10-CM | POA: Diagnosis not present

## 2021-01-20 DIAGNOSIS — W19XXXD Unspecified fall, subsequent encounter: Secondary | ICD-10-CM | POA: Diagnosis not present

## 2021-01-20 DIAGNOSIS — S0990XD Unspecified injury of head, subsequent encounter: Secondary | ICD-10-CM | POA: Diagnosis not present

## 2021-01-22 DIAGNOSIS — M5442 Lumbago with sciatica, left side: Secondary | ICD-10-CM | POA: Diagnosis not present

## 2021-01-22 DIAGNOSIS — S069X0D Unspecified intracranial injury without loss of consciousness, subsequent encounter: Secondary | ICD-10-CM | POA: Diagnosis not present

## 2021-01-22 DIAGNOSIS — G8929 Other chronic pain: Secondary | ICD-10-CM | POA: Diagnosis not present

## 2021-01-22 DIAGNOSIS — M810 Age-related osteoporosis without current pathological fracture: Secondary | ICD-10-CM | POA: Diagnosis not present

## 2021-01-22 DIAGNOSIS — S8992XD Unspecified injury of left lower leg, subsequent encounter: Secondary | ICD-10-CM | POA: Diagnosis not present

## 2021-01-22 DIAGNOSIS — D510 Vitamin B12 deficiency anemia due to intrinsic factor deficiency: Secondary | ICD-10-CM | POA: Diagnosis not present

## 2021-01-23 ENCOUNTER — Other Ambulatory Visit: Payer: Self-pay | Admitting: Family Medicine

## 2021-01-23 DIAGNOSIS — W19XXXA Unspecified fall, initial encounter: Secondary | ICD-10-CM

## 2021-01-25 DIAGNOSIS — W19XXXA Unspecified fall, initial encounter: Secondary | ICD-10-CM | POA: Diagnosis not present

## 2021-01-25 DIAGNOSIS — R42 Dizziness and giddiness: Secondary | ICD-10-CM | POA: Diagnosis not present

## 2021-01-25 DIAGNOSIS — S0990XA Unspecified injury of head, initial encounter: Secondary | ICD-10-CM | POA: Diagnosis not present

## 2021-01-29 DIAGNOSIS — D510 Vitamin B12 deficiency anemia due to intrinsic factor deficiency: Secondary | ICD-10-CM | POA: Diagnosis not present

## 2021-01-29 DIAGNOSIS — M5442 Lumbago with sciatica, left side: Secondary | ICD-10-CM | POA: Diagnosis not present

## 2021-01-29 DIAGNOSIS — G8929 Other chronic pain: Secondary | ICD-10-CM | POA: Diagnosis not present

## 2021-01-29 DIAGNOSIS — S069X0D Unspecified intracranial injury without loss of consciousness, subsequent encounter: Secondary | ICD-10-CM | POA: Diagnosis not present

## 2021-01-29 DIAGNOSIS — M810 Age-related osteoporosis without current pathological fracture: Secondary | ICD-10-CM | POA: Diagnosis not present

## 2021-01-29 DIAGNOSIS — S8992XD Unspecified injury of left lower leg, subsequent encounter: Secondary | ICD-10-CM | POA: Diagnosis not present

## 2021-02-01 ENCOUNTER — Inpatient Hospital Stay: Admission: RE | Admit: 2021-02-01 | Payer: Medicare Other | Source: Ambulatory Visit

## 2021-02-14 ENCOUNTER — Other Ambulatory Visit: Payer: Self-pay

## 2021-02-14 ENCOUNTER — Ambulatory Visit
Admission: RE | Admit: 2021-02-14 | Discharge: 2021-02-14 | Disposition: A | Discharge status: Home / Self Care | Payer: Medicare Other | Source: Ambulatory Visit | Attending: Family Medicine | Admitting: Family Medicine

## 2021-02-14 DIAGNOSIS — W19XXXA Unspecified fall, initial encounter: Secondary | ICD-10-CM

## 2021-02-14 DIAGNOSIS — S0990XA Unspecified injury of head, initial encounter: Secondary | ICD-10-CM | POA: Diagnosis not present

## 2021-03-05 DIAGNOSIS — S51812A Laceration without foreign body of left forearm, initial encounter: Secondary | ICD-10-CM | POA: Diagnosis not present

## 2021-03-05 DIAGNOSIS — E039 Hypothyroidism, unspecified: Secondary | ICD-10-CM | POA: Diagnosis not present

## 2021-03-05 DIAGNOSIS — N3281 Overactive bladder: Secondary | ICD-10-CM | POA: Diagnosis not present

## 2021-03-05 DIAGNOSIS — R42 Dizziness and giddiness: Secondary | ICD-10-CM | POA: Diagnosis not present

## 2021-03-05 DIAGNOSIS — R8290 Unspecified abnormal findings in urine: Secondary | ICD-10-CM | POA: Diagnosis not present

## 2021-03-05 DIAGNOSIS — J449 Chronic obstructive pulmonary disease, unspecified: Secondary | ICD-10-CM | POA: Diagnosis not present

## 2021-03-05 DIAGNOSIS — M150 Primary generalized (osteo)arthritis: Secondary | ICD-10-CM | POA: Diagnosis not present

## 2021-03-05 DIAGNOSIS — M8589 Other specified disorders of bone density and structure, multiple sites: Secondary | ICD-10-CM | POA: Diagnosis not present

## 2021-03-26 DIAGNOSIS — R350 Frequency of micturition: Secondary | ICD-10-CM | POA: Diagnosis not present

## 2021-07-21 DIAGNOSIS — I951 Orthostatic hypotension: Secondary | ICD-10-CM | POA: Diagnosis not present

## 2021-07-21 DIAGNOSIS — M810 Age-related osteoporosis without current pathological fracture: Secondary | ICD-10-CM | POA: Diagnosis not present

## 2021-07-21 DIAGNOSIS — J8410 Pulmonary fibrosis, unspecified: Secondary | ICD-10-CM | POA: Diagnosis not present

## 2021-07-21 DIAGNOSIS — E559 Vitamin D deficiency, unspecified: Secondary | ICD-10-CM | POA: Diagnosis not present

## 2021-07-21 DIAGNOSIS — E039 Hypothyroidism, unspecified: Secondary | ICD-10-CM | POA: Diagnosis not present

## 2021-07-21 DIAGNOSIS — D510 Vitamin B12 deficiency anemia due to intrinsic factor deficiency: Secondary | ICD-10-CM | POA: Diagnosis not present

## 2021-07-21 DIAGNOSIS — R42 Dizziness and giddiness: Secondary | ICD-10-CM | POA: Diagnosis not present

## 2021-07-21 DIAGNOSIS — F339 Major depressive disorder, recurrent, unspecified: Secondary | ICD-10-CM | POA: Diagnosis not present

## 2021-07-21 DIAGNOSIS — E538 Deficiency of other specified B group vitamins: Secondary | ICD-10-CM | POA: Diagnosis not present

## 2021-07-21 DIAGNOSIS — R7309 Other abnormal glucose: Secondary | ICD-10-CM | POA: Diagnosis not present

## 2021-07-22 DIAGNOSIS — Z961 Presence of intraocular lens: Secondary | ICD-10-CM | POA: Diagnosis not present

## 2021-07-22 DIAGNOSIS — H4050X0 Glaucoma secondary to other eye disorders, unspecified eye, stage unspecified: Secondary | ICD-10-CM | POA: Diagnosis not present

## 2021-07-22 DIAGNOSIS — H3581 Retinal edema: Secondary | ICD-10-CM | POA: Diagnosis not present

## 2021-07-25 DIAGNOSIS — H90A21 Sensorineural hearing loss, unilateral, right ear, with restricted hearing on the contralateral side: Secondary | ICD-10-CM | POA: Diagnosis not present

## 2021-08-06 DIAGNOSIS — D510 Vitamin B12 deficiency anemia due to intrinsic factor deficiency: Secondary | ICD-10-CM | POA: Diagnosis not present

## 2021-08-06 DIAGNOSIS — E538 Deficiency of other specified B group vitamins: Secondary | ICD-10-CM | POA: Diagnosis not present

## 2021-09-09 DIAGNOSIS — D510 Vitamin B12 deficiency anemia due to intrinsic factor deficiency: Secondary | ICD-10-CM | POA: Diagnosis not present

## 2022-01-07 NOTE — Progress Notes (Signed)
Radiation Oncology         [phone] ________________________________  Name: Tanya Martinez MRN: [id number]  Date: 01/08/2022  DOB: 1923/04/29  Follow-Up Visit Note  CC: Alroy Dust, L.Marlou Sa, MD  Everitt Amber, MD  No diagnosis found.  Diagnosis: Recurrent endometrial carcinoma (grade 1) at the mid vagina area as well as posterior dome of the bladder   Interval Since Last Radiation: 4 years, 2 months, and 5 days   Radiation treatment dates: 09/29/2017 - 11/03/2017   Dose/rate: Pelvis / 45 Gy delivered in 25 fractions of 1.8 Gy, the patient was unable to tolerate vaginal brachytherapy  Narrative:  The patient returns today for routine annual follow-up, she was last seen here for follow-up on 01/09/21.   Imaging performed since her last visit includes a CT of the head on 02/14/21 (for evaluation of a fall occurring in late May 2022) which showed no acute intracranial abnormalities.   Otherwise, no significant interval history since the patient was last seen.   ***  Allergies:  is allergic to nystatin-triamcinolone, azithromycin, shellfish allergy, and sulfa antibiotics.  Meds: Current Outpatient Medications  Medication Sig Dispense Refill   acetaminophen (TYLENOL) 325 MG tablet Take 2 tablets (650 mg total) by mouth every 6 (six) hours as needed for mild pain. (Patient not taking: No sig reported)     albuterol (PROVENTIL HFA;VENTOLIN HFA) 108 (90 Base) MCG/ACT inhaler Inhale 1-2 puffs into the lungs every 4 (four) hours as needed for wheezing or shortness of breath.     Calcium Carbonate-Vit D-Min (CALTRATE 600+D PLUS MINERALS) 600-800 MG-UNIT TABS Take 1 tablet by mouth daily.      cyanocobalamin (,VITAMIN B-12,) 1000 MCG/ML injection Inject 1,000 mcg into the muscle every 30 (thirty) days.  (Patient not taking: Reported on 01/09/2021)     famotidine (PEPCID) 40 MG tablet Take 40 mg by mouth daily. (Patient not taking: Reported on 01/09/2021)     gabapentin (NEURONTIN) 100 MG capsule  Take 300 mg by mouth at bedtime.      latanoprost (XALATAN) 0.005 % ophthalmic solution Place 1 drop into both eyes at bedtime.      levothyroxine (SYNTHROID, LEVOTHROID) 50 MCG tablet Take 50 mcg by mouth daily before breakfast.     mirtazapine (REMERON) 15 MG tablet Take 15 mg by mouth at bedtime.      UNABLE TO FIND as needed. Hemp cream     No current facility-administered medications for this encounter.    Physical Findings: The patient is in no acute distress. Patient is alert and oriented.  vitals were not taken for this visit. .  No significant changes. Lungs are clear to auscultation bilaterally. Heart has regular rate and rhythm. No palpable cervical, supraclavicular, or axillary adenopathy. Abdomen soft, non-tender, normal bowel sounds.  On pelvic examination the external genitalia were unremarkable. A speculum exam was performed. There are no mucosal lesions noted in the vaginal vault. A Pap smear was obtained of the proximal vagina. On bimanual and rectovaginal examination there were no pelvic masses appreciated. ***    Lab Findings: Lab Results  Component Value Date   WBC 5.1 09/28/2018   HGB 11.9 (L) 09/28/2018   HCT 36.7 09/28/2018   MCV 101.7 (H) 09/28/2018   PLT 162 09/28/2018    Radiographic Findings: No results found.  Impression: Recurrent endometrial carcinoma (grade 1) at the mid vagina area as well as posterior dome of the bladder   The patient is recovering from the effects of radiation.  ***  Plan:  ***   *** minutes of total time was spent for this patient encounter, including preparation, face-to-face counseling with the patient and coordination of care, physical exam, and documentation of the encounter. ____________________________________  Blair Promise, PhD, MD  This document serves as a record of services personally performed by Gery Pray, MD. It was created on his behalf by Roney Mans, a trained medical scribe. The creation of this  record is based on the scribe's personal observations and the provider's statements to them. This document has been checked and approved by the attending provider.

## 2022-01-08 ENCOUNTER — Encounter: Payer: Self-pay | Admitting: Radiation Oncology

## 2022-01-08 ENCOUNTER — Ambulatory Visit
Admission: RE | Admit: 2022-01-08 | Discharge: 2022-01-08 | Disposition: A | Discharge status: Home / Self Care | Payer: Medicare Other | Source: Ambulatory Visit | Attending: Radiation Oncology | Admitting: Radiation Oncology

## 2022-01-08 VITALS — BP 143/68 | HR 64 | Temp 97.2°F | Resp 20 | Ht 62.0 in | Wt 114.8 lb

## 2022-01-08 DIAGNOSIS — Z79899 Other long term (current) drug therapy: Secondary | ICD-10-CM | POA: Insufficient documentation

## 2022-01-08 DIAGNOSIS — C541 Malignant neoplasm of endometrium: Secondary | ICD-10-CM

## 2022-01-08 DIAGNOSIS — Z8542 Personal history of malignant neoplasm of other parts of uterus: Secondary | ICD-10-CM | POA: Diagnosis not present

## 2022-01-08 DIAGNOSIS — C7982 Secondary malignant neoplasm of genital organs: Secondary | ICD-10-CM

## 2022-01-08 NOTE — Progress Notes (Signed)
Tanya Martinez is here today for follow up post radiation to the pelvic.  They completed their radiation on: 11/03/17  Does the patient complain of any of the following:  Pain:No Abdominal bloating: No Diarrhea/Constipation: No Nausea/Vomiting: No Vaginal Discharge: No Blood in Urine or Stool: No Urinary Issues (dysuria/incomplete emptying/ incontinence/ increased frequency/urgency): Patient reports frequency.  Does patient report using vaginal dilator 2-3 times a week and/or sexually active 2-3 weeks: No Post radiation skin changes: No   Additional comments if applicable:   BP (!) 287/68 (BP Location: Right Arm, Patient Position: Sitting, Cuff Size: Normal)   Pulse 64   Temp (!) 97.2 F (36.2 C)   Resp 20   Ht '5\' 2"'$  (1.575 m)   Wt 114 lb 12.8 oz (52.1 kg)   SpO2 99%   BMI 21.00 kg/m

## 2022-01-19 DIAGNOSIS — G8929 Other chronic pain: Secondary | ICD-10-CM | POA: Diagnosis not present

## 2022-01-19 DIAGNOSIS — M810 Age-related osteoporosis without current pathological fracture: Secondary | ICD-10-CM | POA: Diagnosis not present

## 2022-01-19 DIAGNOSIS — Z0000 Encounter for general adult medical examination without abnormal findings: Secondary | ICD-10-CM | POA: Diagnosis not present

## 2022-01-19 DIAGNOSIS — E039 Hypothyroidism, unspecified: Secondary | ICD-10-CM | POA: Diagnosis not present

## 2022-01-19 DIAGNOSIS — K219 Gastro-esophageal reflux disease without esophagitis: Secondary | ICD-10-CM | POA: Diagnosis not present

## 2022-01-19 DIAGNOSIS — J8410 Pulmonary fibrosis, unspecified: Secondary | ICD-10-CM | POA: Diagnosis not present

## 2022-01-19 DIAGNOSIS — D510 Vitamin B12 deficiency anemia due to intrinsic factor deficiency: Secondary | ICD-10-CM | POA: Diagnosis not present

## 2022-01-19 DIAGNOSIS — E559 Vitamin D deficiency, unspecified: Secondary | ICD-10-CM | POA: Diagnosis not present

## 2022-01-19 DIAGNOSIS — F339 Major depressive disorder, recurrent, unspecified: Secondary | ICD-10-CM | POA: Diagnosis not present

## 2022-01-19 DIAGNOSIS — R7302 Impaired glucose tolerance (oral): Secondary | ICD-10-CM | POA: Diagnosis not present

## 2022-01-26 DIAGNOSIS — D510 Vitamin B12 deficiency anemia due to intrinsic factor deficiency: Secondary | ICD-10-CM | POA: Diagnosis not present

## 2022-01-26 DIAGNOSIS — H4050X0 Glaucoma secondary to other eye disorders, unspecified eye, stage unspecified: Secondary | ICD-10-CM | POA: Diagnosis not present

## 2022-02-04 DIAGNOSIS — D510 Vitamin B12 deficiency anemia due to intrinsic factor deficiency: Secondary | ICD-10-CM | POA: Diagnosis not present

## 2022-02-05 DIAGNOSIS — D510 Vitamin B12 deficiency anemia due to intrinsic factor deficiency: Secondary | ICD-10-CM | POA: Diagnosis not present

## 2022-02-10 DIAGNOSIS — D510 Vitamin B12 deficiency anemia due to intrinsic factor deficiency: Secondary | ICD-10-CM | POA: Diagnosis not present

## 2022-03-10 DIAGNOSIS — E039 Hypothyroidism, unspecified: Secondary | ICD-10-CM | POA: Diagnosis not present

## 2022-03-10 DIAGNOSIS — Z0001 Encounter for general adult medical examination with abnormal findings: Secondary | ICD-10-CM | POA: Diagnosis not present

## 2022-03-10 DIAGNOSIS — M810 Age-related osteoporosis without current pathological fracture: Secondary | ICD-10-CM | POA: Diagnosis not present

## 2022-03-10 DIAGNOSIS — D510 Vitamin B12 deficiency anemia due to intrinsic factor deficiency: Secondary | ICD-10-CM | POA: Diagnosis not present

## 2022-03-10 DIAGNOSIS — R0609 Other forms of dyspnea: Secondary | ICD-10-CM | POA: Diagnosis not present

## 2022-03-11 DIAGNOSIS — N3281 Overactive bladder: Secondary | ICD-10-CM | POA: Diagnosis not present

## 2022-03-11 DIAGNOSIS — J8410 Pulmonary fibrosis, unspecified: Secondary | ICD-10-CM | POA: Diagnosis not present

## 2022-03-11 DIAGNOSIS — H4050X0 Glaucoma secondary to other eye disorders, unspecified eye, stage unspecified: Secondary | ICD-10-CM | POA: Diagnosis not present

## 2022-03-11 DIAGNOSIS — K219 Gastro-esophageal reflux disease without esophagitis: Secondary | ICD-10-CM | POA: Diagnosis not present

## 2022-03-11 DIAGNOSIS — R531 Weakness: Secondary | ICD-10-CM | POA: Diagnosis not present

## 2022-03-11 DIAGNOSIS — Z0000 Encounter for general adult medical examination without abnormal findings: Secondary | ICD-10-CM | POA: Diagnosis not present

## 2022-03-11 DIAGNOSIS — E039 Hypothyroidism, unspecified: Secondary | ICD-10-CM | POA: Diagnosis not present

## 2022-04-02 DIAGNOSIS — D510 Vitamin B12 deficiency anemia due to intrinsic factor deficiency: Secondary | ICD-10-CM | POA: Diagnosis not present

## 2023-12-05 ENCOUNTER — Emergency Department (HOSPITAL_BASED_OUTPATIENT_CLINIC_OR_DEPARTMENT_OTHER)

## 2023-12-05 ENCOUNTER — Emergency Department (HOSPITAL_BASED_OUTPATIENT_CLINIC_OR_DEPARTMENT_OTHER)
Admission: EM | Admit: 2023-12-05 | Discharge: 2023-12-05 | Disposition: A | Discharge status: Home / Self Care | Attending: Emergency Medicine | Admitting: Emergency Medicine

## 2023-12-05 ENCOUNTER — Encounter (HOSPITAL_BASED_OUTPATIENT_CLINIC_OR_DEPARTMENT_OTHER): Payer: Self-pay | Admitting: Emergency Medicine

## 2023-12-05 DIAGNOSIS — R112 Nausea with vomiting, unspecified: Secondary | ICD-10-CM | POA: Diagnosis present

## 2023-12-05 DIAGNOSIS — E875 Hyperkalemia: Secondary | ICD-10-CM | POA: Diagnosis not present

## 2023-12-05 DIAGNOSIS — D72829 Elevated white blood cell count, unspecified: Secondary | ICD-10-CM | POA: Diagnosis not present

## 2023-12-05 DIAGNOSIS — K8020 Calculus of gallbladder without cholecystitis without obstruction: Secondary | ICD-10-CM | POA: Insufficient documentation

## 2023-12-05 DIAGNOSIS — R319 Hematuria, unspecified: Secondary | ICD-10-CM

## 2023-12-05 DIAGNOSIS — Z8542 Personal history of malignant neoplasm of other parts of uterus: Secondary | ICD-10-CM | POA: Diagnosis not present

## 2023-12-05 DIAGNOSIS — N3289 Other specified disorders of bladder: Secondary | ICD-10-CM | POA: Diagnosis not present

## 2023-12-05 LAB — CBC WITH DIFFERENTIAL/PLATELET
Abs Immature Granulocytes: 0.06 10*3/uL (ref 0.00–0.07)
Basophils Absolute: 0 10*3/uL (ref 0.0–0.1)
Basophils Relative: 0 %
Eosinophils Absolute: 0.2 10*3/uL (ref 0.0–0.5)
Eosinophils Relative: 1 %
HCT: 39.1 % (ref 36.0–46.0)
Hemoglobin: 13.2 g/dL (ref 12.0–15.0)
Immature Granulocytes: 1 %
Lymphocytes Relative: 5 %
Lymphs Abs: 0.6 10*3/uL — ABNORMAL LOW (ref 0.7–4.0)
MCH: 33.2 pg (ref 26.0–34.0)
MCHC: 33.8 g/dL (ref 30.0–36.0)
MCV: 98.5 fL (ref 80.0–100.0)
Monocytes Absolute: 0.5 10*3/uL (ref 0.1–1.0)
Monocytes Relative: 4 %
Neutro Abs: 11.1 10*3/uL — ABNORMAL HIGH (ref 1.7–7.7)
Neutrophils Relative %: 89 %
Platelets: 174 10*3/uL (ref 150–400)
RBC: 3.97 MIL/uL (ref 3.87–5.11)
RDW: 13.9 % (ref 11.5–15.5)
WBC: 12.4 10*3/uL — ABNORMAL HIGH (ref 4.0–10.5)
nRBC: 0 % (ref 0.0–0.2)

## 2023-12-05 LAB — RESP PANEL BY RT-PCR (RSV, FLU A&B, COVID)  RVPGX2
Influenza A by PCR: NEGATIVE
Influenza B by PCR: NEGATIVE
Resp Syncytial Virus by PCR: NEGATIVE
SARS Coronavirus 2 by RT PCR: NEGATIVE

## 2023-12-05 LAB — COMPREHENSIVE METABOLIC PANEL WITH GFR
ALT: 11 U/L (ref 0–44)
AST: 28 U/L (ref 15–41)
Albumin: 4.3 g/dL (ref 3.5–5.0)
Alkaline Phosphatase: 36 U/L — ABNORMAL LOW (ref 38–126)
Anion gap: 7 (ref 5–15)
BUN: 21 mg/dL (ref 8–23)
CO2: 27 mmol/L (ref 22–32)
Calcium: 9 mg/dL (ref 8.9–10.3)
Chloride: 104 mmol/L (ref 98–111)
Creatinine, Ser: 0.78 mg/dL (ref 0.44–1.00)
GFR, Estimated: >60 mL/min (ref >60)
Glucose, Bld: 100 mg/dL — ABNORMAL HIGH (ref 70–99)
Potassium: 5.9 mmol/L — ABNORMAL HIGH (ref 3.5–5.1)
Sodium: 138 mmol/L (ref 135–145)
Total Bilirubin: 0.8 mg/dL (ref 0.0–1.2)
Total Protein: 6.8 g/dL (ref 6.5–8.1)

## 2023-12-05 LAB — URINALYSIS, W/ REFLEX TO CULTURE (INFECTION SUSPECTED)
Bacteria, UA: NONE SEEN
Bilirubin Urine: NEGATIVE
Glucose, UA: NEGATIVE mg/dL
Ketones, ur: 40 mg/dL — AB
Leukocytes,Ua: NEGATIVE
Nitrite: NEGATIVE
Protein, ur: 100 mg/dL — AB
RBC / HPF: >50 RBC/hpf (ref 0–5)
Specific Gravity, Urine: 1.039 — ABNORMAL HIGH (ref 1.005–1.030)
pH: 6 (ref 5.0–8.0)

## 2023-12-05 LAB — LIPASE, BLOOD: Lipase: 34 U/L (ref 11–51)

## 2023-12-05 LAB — TROPONIN I (HIGH SENSITIVITY): Troponin I (High Sensitivity): 4 ng/L (ref <18)

## 2023-12-05 LAB — POTASSIUM: Potassium: 3.8 mmol/L (ref 3.5–5.1)

## 2023-12-05 MED ORDER — SODIUM CHLORIDE 0.9 % IV BOLUS
1000.0000 mL | Freq: Once | INTRAVENOUS | Status: AC
  Administered 2023-12-05: 1000 mL via INTRAVENOUS

## 2023-12-05 MED ORDER — ONDANSETRON 4 MG PO TBDP: 4.0000 mg | ORAL_TABLET | Freq: Three times a day (TID) | ORAL | 0 refills | Status: AC | PRN

## 2023-12-05 MED ORDER — IOHEXOL 300 MG/ML  SOLN
100.0000 mL | Freq: Once | INTRAMUSCULAR | Status: AC | PRN
  Administered 2023-12-05: 100 mL via INTRAVENOUS

## 2023-12-05 NOTE — ED Triage Notes (Signed)
 Nausea, vomiting on Tuesday Some improvement Today noticed blood in urine and again had some nausea

## 2023-12-05 NOTE — ED Notes (Signed)
 Patient transported to CT

## 2023-12-05 NOTE — ED Provider Notes (Signed)
 [place] EMERGENCY DEPARTMENT AT [facility] Provider Note   CSN: [id number] Arrival date & time: 12/05/23  1437     History {Add pertinent medical, surgical, social history, OB history to HPI:1} Chief Complaint  Patient presents with   Emesis    Tanya Martinez is a 89 y.o. female.  She is brought in by her daughter who is helping with a history.  She had nausea and vomiting on Tuesday.  Symptoms improved but then recurred again this morning.  Vomited 4 times today.  Nonbloody nonbilious.  She also noticed blood in her urine today.  No dysuria.  No chest or abdominal pain no fevers or chills.  She has tried nothing for her symptoms.  Daughter states she had a history of endometrial cancer status postresection and had a recurrence a few years back.  Was a tumor in her vaginal wall and bladder that was excised and she had radiation.  She has chronic urinary frequency.  The history is provided by the patient.  Emesis Severity:  Moderate Chronicity:  New Associated symptoms: no abdominal pain, no cough, no diarrhea and no fever   Risk factors: prior abdominal surgery   Risk factors: no sick contacts and no travel to endemic areas        Home Medications Prior to Admission medications   Medication Sig Start Date End Date Taking? Authorizing Provider  acetaminophen  (TYLENOL ) 325 MG tablet Take 2 tablets (650 mg total) by mouth every 6 (six) hours as needed for mild pain. Patient not taking: Reported on 01/08/2022 09/28/18   Barbee Lew, MD  albuterol  (PROVENTIL  HFA;VENTOLIN  HFA) 108 (90 Base) MCG/ACT inhaler Inhale 1-2 puffs into the lungs every 4 (four) hours as needed for wheezing or shortness of breath.    [provider]  Calcium  Carbonate-Vit D-Min (CALTRATE 600+D PLUS MINERALS) 600-800 MG-UNIT TABS Take 1 tablet by mouth daily.     [provider]  cyanocobalamin  (,VITAMIN B-12,) 1000 MCG/ML injection Inject 1,000 mcg into the muscle every 30  (thirty) days.  Patient not taking: Reported on 01/09/2021 04/14/20   [provider]  famotidine (PEPCID) 40 MG tablet Take 40 mg by mouth daily. 06/11/20   [provider]  gabapentin (NEURONTIN) 100 MG capsule Take 300 mg by mouth at bedtime.  04/17/19   [provider]  latanoprost  (XALATAN ) 0.005 % ophthalmic solution Place 1 drop into both eyes at bedtime.     [provider]  levothyroxine  (SYNTHROID , LEVOTHROID) 50 MCG tablet Take 50 mcg by mouth daily before breakfast.    [provider]  mirtazapine (REMERON) 15 MG tablet Take 15 mg by mouth at bedtime.  Patient not taking: Reported on 01/08/2022 04/17/19   [provider]  UNABLE TO FIND as needed. Hemp cream    [provider]      Allergies    Nystatin -triamcinolone , Azithromycin , Shellfish allergy, and Sulfa antibiotics    Review of Systems   Review of Systems  Constitutional:  Negative for fever.  Respiratory:  Negative for cough.   Cardiovascular:  Negative for chest pain.  Gastrointestinal:  Positive for nausea and vomiting. Negative for abdominal pain and diarrhea.  Genitourinary:  Positive for frequency and hematuria. Negative for dysuria.    Physical Exam Updated Vital Signs BP (!) 140/64 (BP Location: Left Arm)   Pulse 70   Temp 97.9 F (36.6 C) (Oral)   Resp 20   SpO2 98%  Physical Exam Vitals and nursing note reviewed.  Constitutional:      General: She is not in acute distress.    Appearance: Normal appearance. She is well-developed.  HENT:     Head: Normocephalic and atraumatic.  Eyes:     Conjunctiva/sclera: Conjunctivae normal.  Cardiovascular:     Rate and Rhythm: Normal rate and regular rhythm.     Heart sounds: No murmur heard. Pulmonary:     Effort: Pulmonary effort is normal. No respiratory distress.     Breath sounds: Normal breath sounds. No stridor. No wheezing.  Abdominal:     Palpations: Abdomen is soft.     Tenderness: There  is no abdominal tenderness. There is no guarding or rebound.  Musculoskeletal:        General: No tenderness or deformity. Normal range of motion.     Cervical back: Neck supple.  Skin:    General: Skin is warm and dry.  Neurological:     General: No focal deficit present.     Mental Status: She is alert.     GCS: GCS eye subscore is 4. GCS verbal subscore is 5. GCS motor subscore is 6.     Sensory: No sensory deficit.     Motor: No weakness.     ED Results / Procedures / Treatments   Labs (all labs ordered are listed, but only abnormal results are displayed) Labs Reviewed - No data to display  EKG None  Radiology No results found.  Procedures Procedures  {Document cardiac monitor, telemetry assessment procedure when appropriate:1}  Medications Ordered in ED Medications  sodium chloride  0.9 % bolus 1,000 mL (has no administration in time range)    ED Course/ Medical Decision Making/ A&P   {   Click here for ABCD2, HEART and other calculatorsREFRESH Note before signing :1}                              Medical Decision Making Amount and/or Complexity of Data Reviewed Labs: ordered. Radiology: ordered.   This patient complains of ***; this involves an extensive number of treatment Options and is a complaint that carries with it a high risk of complications and morbidity. The differential includes ***  I ordered, reviewed and interpreted labs, which included *** I ordered medication *** and reviewed PMP when indicated. I ordered imaging studies which included *** and I independently    visualized and interpreted imaging which showed *** Additional history obtained from *** Previous records obtained and reviewed *** I consulted *** and discussed lab and imaging findings and discussed disposition.  Cardiac monitoring reviewed, *** Social determinants considered, *** Critical Interventions: ***  After the interventions stated above, I reevaluated the patient and  found *** Admission and further testing considered, ***   {Document critical care time when appropriate:1} {Document review of labs and clinical decision tools ie heart score, Chads2Vasc2 etc:1}  {Document your independent review of radiology images, and any outside records:1} {Document your discussion with family members, caretakers, and with consultants:1} {Document social determinants of health affecting pt's care:1} {Document your decision making why or why not admission, treatments were needed:1} Final Clinical Impression(s) / ED Diagnoses Final diagnoses:  None    Rx / DC Orders ED Discharge Orders     None

## 2023-12-05 NOTE — Discharge Instructions (Signed)
 You were seen in the emergency department for nausea vomiting and some blood in your urine.  Your lab work did not show any significant abnormalities but your urine showed signs of blood but not infection.  Your CAT scan showed a mass in the bladder that will need outpatient follow-up with urology for possible cystoscopy.  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  We are prescribing some nausea medication.  Please stay well-hydrated.  Return if any worsening or concerning symptoms

## 2024-02-10 ENCOUNTER — Other Ambulatory Visit: Payer: Self-pay

## 2024-02-10 ENCOUNTER — Encounter (HOSPITAL_COMMUNITY): Payer: Self-pay

## 2024-02-10 ENCOUNTER — Inpatient Hospital Stay (HOSPITAL_COMMUNITY)
Admission: EM | Admit: 2024-02-10 | Discharge: 2024-02-12 | DRG: 872 | Disposition: A | Discharge status: Home Health | Attending: Internal Medicine | Admitting: Internal Medicine

## 2024-02-10 ENCOUNTER — Emergency Department (HOSPITAL_COMMUNITY)

## 2024-02-10 DIAGNOSIS — R112 Nausea with vomiting, unspecified: Secondary | ICD-10-CM | POA: Diagnosis not present

## 2024-02-10 DIAGNOSIS — Z8542 Personal history of malignant neoplasm of other parts of uterus: Secondary | ICD-10-CM

## 2024-02-10 DIAGNOSIS — Z882 Allergy status to sulfonamides status: Secondary | ICD-10-CM

## 2024-02-10 DIAGNOSIS — K5792 Diverticulitis of intestine, part unspecified, without perforation or abscess without bleeding: Secondary | ICD-10-CM | POA: Diagnosis not present

## 2024-02-10 DIAGNOSIS — Z860100 Personal history of colon polyps, unspecified: Secondary | ICD-10-CM

## 2024-02-10 DIAGNOSIS — E860 Dehydration: Secondary | ICD-10-CM | POA: Diagnosis present

## 2024-02-10 DIAGNOSIS — Z823 Family history of stroke: Secondary | ICD-10-CM

## 2024-02-10 DIAGNOSIS — Z8701 Personal history of pneumonia (recurrent): Secondary | ICD-10-CM

## 2024-02-10 DIAGNOSIS — Z79890 Hormone replacement therapy (postmenopausal): Secondary | ICD-10-CM

## 2024-02-10 DIAGNOSIS — A419 Sepsis, unspecified organism: Principal | ICD-10-CM | POA: Diagnosis present

## 2024-02-10 DIAGNOSIS — N3289 Other specified disorders of bladder: Secondary | ICD-10-CM | POA: Diagnosis not present

## 2024-02-10 DIAGNOSIS — G8929 Other chronic pain: Secondary | ICD-10-CM | POA: Diagnosis present

## 2024-02-10 DIAGNOSIS — N938 Other specified abnormal uterine and vaginal bleeding: Secondary | ICD-10-CM | POA: Diagnosis present

## 2024-02-10 DIAGNOSIS — Z888 Allergy status to other drugs, medicaments and biological substances status: Secondary | ICD-10-CM

## 2024-02-10 DIAGNOSIS — E872 Acidosis, unspecified: Secondary | ICD-10-CM | POA: Diagnosis present

## 2024-02-10 DIAGNOSIS — Z881 Allergy status to other antibiotic agents status: Secondary | ICD-10-CM

## 2024-02-10 DIAGNOSIS — Z66 Do not resuscitate: Secondary | ICD-10-CM | POA: Diagnosis present

## 2024-02-10 DIAGNOSIS — N329 Bladder disorder, unspecified: Secondary | ICD-10-CM | POA: Diagnosis present

## 2024-02-10 DIAGNOSIS — E039 Hypothyroidism, unspecified: Secondary | ICD-10-CM | POA: Diagnosis present

## 2024-02-10 DIAGNOSIS — Z923 Personal history of irradiation: Secondary | ICD-10-CM

## 2024-02-10 DIAGNOSIS — N3001 Acute cystitis with hematuria: Secondary | ICD-10-CM

## 2024-02-10 DIAGNOSIS — Z602 Problems related to living alone: Secondary | ICD-10-CM | POA: Diagnosis present

## 2024-02-10 DIAGNOSIS — Z8249 Family history of ischemic heart disease and other diseases of the circulatory system: Secondary | ICD-10-CM

## 2024-02-10 DIAGNOSIS — E538 Deficiency of other specified B group vitamins: Secondary | ICD-10-CM | POA: Diagnosis present

## 2024-02-10 DIAGNOSIS — N390 Urinary tract infection, site not specified: Secondary | ICD-10-CM | POA: Diagnosis present

## 2024-02-10 DIAGNOSIS — K5732 Diverticulitis of large intestine without perforation or abscess without bleeding: Secondary | ICD-10-CM | POA: Diagnosis present

## 2024-02-10 DIAGNOSIS — K219 Gastro-esophageal reflux disease without esophagitis: Secondary | ICD-10-CM | POA: Diagnosis present

## 2024-02-10 DIAGNOSIS — M545 Low back pain, unspecified: Secondary | ICD-10-CM | POA: Diagnosis present

## 2024-02-10 DIAGNOSIS — R197 Diarrhea, unspecified: Secondary | ICD-10-CM

## 2024-02-10 DIAGNOSIS — Z79899 Other long term (current) drug therapy: Secondary | ICD-10-CM

## 2024-02-10 DIAGNOSIS — Z90710 Acquired absence of both cervix and uterus: Secondary | ICD-10-CM

## 2024-02-10 DIAGNOSIS — Z91013 Allergy to seafood: Secondary | ICD-10-CM

## 2024-02-10 DIAGNOSIS — R531 Weakness: Secondary | ICD-10-CM

## 2024-02-10 LAB — URINALYSIS, ROUTINE W REFLEX MICROSCOPIC
Bilirubin Urine: NEGATIVE
Glucose, UA: NEGATIVE mg/dL
Ketones, ur: 5 mg/dL — AB
Leukocytes,Ua: NEGATIVE
Nitrite: NEGATIVE
Protein, ur: 100 mg/dL — AB
RBC / HPF: >50 RBC/hpf (ref 0–5)
Specific Gravity, Urine: 1.021 (ref 1.005–1.030)
pH: 5 (ref 5.0–8.0)

## 2024-02-10 LAB — CBC
HCT: 42 % (ref 36.0–46.0)
Hemoglobin: 13.7 g/dL (ref 12.0–15.0)
MCH: 33.3 pg (ref 26.0–34.0)
MCHC: 32.6 g/dL (ref 30.0–36.0)
MCV: 102.2 fL — ABNORMAL HIGH (ref 80.0–100.0)
Platelets: 216 10*3/uL (ref 150–400)
RBC: 4.11 MIL/uL (ref 3.87–5.11)
RDW: 14.8 % (ref 11.5–15.5)
WBC: 49.8 10*3/uL — ABNORMAL HIGH (ref 4.0–10.5)
nRBC: 0 % (ref 0.0–0.2)

## 2024-02-10 LAB — COMPREHENSIVE METABOLIC PANEL WITH GFR
ALT: 12 U/L (ref 0–44)
AST: 21 U/L (ref 15–41)
Albumin: 3.6 g/dL (ref 3.5–5.0)
Alkaline Phosphatase: 38 U/L (ref 38–126)
Anion gap: 10 (ref 5–15)
BUN: 23 mg/dL (ref 8–23)
CO2: 24 mmol/L (ref 22–32)
Calcium: 8.5 mg/dL — ABNORMAL LOW (ref 8.9–10.3)
Chloride: 106 mmol/L (ref 98–111)
Creatinine, Ser: 0.71 mg/dL (ref 0.44–1.00)
GFR, Estimated: >60 mL/min (ref >60)
Glucose, Bld: 136 mg/dL — ABNORMAL HIGH (ref 70–99)
Potassium: 4 mmol/L (ref 3.5–5.1)
Sodium: 140 mmol/L (ref 135–145)
Total Bilirubin: 0.8 mg/dL (ref 0.0–1.2)
Total Protein: 6.2 g/dL — ABNORMAL LOW (ref 6.5–8.1)

## 2024-02-10 LAB — I-STAT CG4 LACTIC ACID, ED: Lactic Acid, Venous: 2.9 mmol/L (ref 0.5–1.9)

## 2024-02-10 LAB — LIPASE, BLOOD: Lipase: 30 U/L (ref 11–51)

## 2024-02-10 LAB — LACTIC ACID, PLASMA: Lactic Acid, Venous: 2.9 mmol/L (ref 0.5–1.9)

## 2024-02-10 MED ORDER — ENOXAPARIN SODIUM 30 MG/0.3ML IJ SOSY
30.0000 mg | PREFILLED_SYRINGE | INTRAMUSCULAR | Status: DC
  Administered 2024-02-10 – 2024-02-11 (×2): 30 mg via SUBCUTANEOUS
  Filled 2024-02-10 (×2): qty 0.3

## 2024-02-10 MED ORDER — IOHEXOL 300 MG/ML  SOLN
80.0000 mL | Freq: Once | INTRAMUSCULAR | Status: AC | PRN
  Administered 2024-02-10: 80 mL via INTRAVENOUS

## 2024-02-10 MED ORDER — SENNOSIDES-DOCUSATE SODIUM 8.6-50 MG PO TABS: 1.0000 | ORAL_TABLET | Freq: Every evening | ORAL | Status: DC | PRN

## 2024-02-10 MED ORDER — METRONIDAZOLE 500 MG/100ML IV SOLN
500.0000 mg | Freq: Once | INTRAVENOUS | Status: AC
  Administered 2024-02-10: 500 mg via INTRAVENOUS
  Filled 2024-02-10: qty 100

## 2024-02-10 MED ORDER — ENSURE PLUS HIGH PROTEIN PO LIQD
237.0000 mL | Freq: Three times a day (TID) | ORAL | Status: DC
  Administered 2024-02-10 – 2024-02-12 (×5): 237 mL via ORAL

## 2024-02-10 MED ORDER — ONDANSETRON HCL 4 MG/2ML IJ SOLN
4.0000 mg | Freq: Four times a day (QID) | INTRAMUSCULAR | Status: DC | PRN
Start: 2024-02-10 — End: 2024-02-12

## 2024-02-10 MED ORDER — ONDANSETRON HCL 4 MG PO TABS: 4.0000 mg | ORAL_TABLET | Freq: Four times a day (QID) | ORAL | Status: DC | PRN

## 2024-02-10 MED ORDER — LATANOPROST 0.005 % OP SOLN
1.0000 [drp] | Freq: Every day | OPHTHALMIC | Status: DC
  Administered 2024-02-10 – 2024-02-11 (×2): 1 [drp] via OPHTHALMIC
  Filled 2024-02-10: qty 2.5

## 2024-02-10 MED ORDER — SODIUM CHLORIDE 0.9 % IV BOLUS
1000.0000 mL | Freq: Once | INTRAVENOUS | Status: AC
  Administered 2024-02-10: 1000 mL via INTRAVENOUS

## 2024-02-10 MED ORDER — LACTATED RINGERS IV BOLUS
1000.0000 mL | Freq: Once | INTRAVENOUS | Status: AC
  Administered 2024-02-10: 1000 mL via INTRAVENOUS

## 2024-02-10 MED ORDER — SODIUM CHLORIDE 0.9 % IV SOLN: INTRAVENOUS | Status: AC

## 2024-02-10 MED ORDER — LEVOTHYROXINE SODIUM 50 MCG PO TABS
50.0000 ug | ORAL_TABLET | Freq: Every day | ORAL | Status: DC
  Administered 2024-02-11 – 2024-02-12 (×2): 50 ug via ORAL
  Filled 2024-02-10 (×2): qty 1

## 2024-02-10 MED ORDER — ONDANSETRON HCL 4 MG/2ML IJ SOLN
4.0000 mg | Freq: Once | INTRAMUSCULAR | Status: AC
  Administered 2024-02-10: 4 mg via INTRAVENOUS
  Filled 2024-02-10: qty 2

## 2024-02-10 MED ORDER — ACETAMINOPHEN 650 MG RE SUPP: 650.0000 mg | Freq: Four times a day (QID) | RECTAL | Status: DC | PRN

## 2024-02-10 MED ORDER — SODIUM CHLORIDE 0.9 % IV SOLN
2.0000 g | Freq: Once | INTRAVENOUS | Status: AC
  Administered 2024-02-10: 2 g via INTRAVENOUS
  Filled 2024-02-10: qty 20

## 2024-02-10 MED ORDER — ACETAMINOPHEN 325 MG PO TABS
650.0000 mg | ORAL_TABLET | Freq: Four times a day (QID) | ORAL | Status: DC | PRN
  Administered 2024-02-11: 650 mg via ORAL
  Filled 2024-02-10: qty 2

## 2024-02-10 NOTE — Sepsis Progress Note (Signed)
 Elink will follow per sepsis protocol.

## 2024-02-10 NOTE — ED Provider Notes (Signed)
 Pt signed out pending labs.  CBC elevated at 49.8 (12.4 in April).  CMP nl.  Lip nl.  UA with hematuria (chronic), but many bacteria.  Lactic elevated to 2.9.  Code sepsis called.  Pt's temp has improved, so she's taken off the bair hugger.    CT abd/pelvis:  1. Sigmoid diverticulosis. Mild perisigmoid haziness may be chronic.  Correlation with clinical exam recommended to evaluate for  possibility of acute diverticulitis. No bowel obstruction. Normal  appendix.  2. Cholelithiasis.  3. Moderate size hiatal hernia.  4. Enhancing 2.1 x 2.7 cm lesion arising from the posterior bladder  wall suspicious for malignancy. Cystoscopy was previously  recommended.  5.  Aortic Atherosclerosis (ICD10-I70.0).    Pt has seen Dr. Alvaro (urology) about the bladder mass.  Nothing to do.  Pt does have lower abd pain and elevated wbc, so flagyl added.  Pt d/w Dr. Lou ([facility]) for admission.  CRITICAL CARE Performed by: Mliss Boyers   Total critical care time: 30 minutes  Critical care time was exclusive of separately billable procedures and treating other patients.  Critical care was necessary to treat or prevent imminent or life-threatening deterioration.  Critical care was time spent personally by me on the following activities: development of treatment plan with patient and/or surrogate as well as nursing, discussions with consultants, evaluation of patient's response to treatment, examination of patient, obtaining history from patient or surrogate, ordering and performing treatments and interventions, ordering and review of laboratory studies, ordering and review of radiographic studies, pulse oximetry and re-evaluation of patient's condition.    Boyers Mliss, MD 02/10/24 [phone]

## 2024-02-10 NOTE — H&P (Addendum)
 History and Physical  Tanya Martinez FMW:[id number] DOB: 28-Sep-1922 DOA: 02/10/2024  PCP: Burney Darice CROME, MD   Chief Complaint:, Nausea, vomiting, abdominal pain, generalized weakness  HPI: Tanya Martinez is a 89 y.o. female with medical history significant for hypothyroidism, vitamin B12 deficient anemia, chronic low back pain and GERD who presented to the ED for evaluation of nausea, vomiting and watery stools. Per daughter, patient started having some nausea and vomiting today as well as watery stools. She started getting weak so she brought her to the ED before she became dehydrated. Patient has a history of constipation and has had mild lower abdominal pain but no fevers, chills, dysuria, bloody stools, hematuria, chest pain, dizziness or shortness of breath.  ED Course: Initial vitals show temp 94.8, RR 24, HR 78, BP 131/68, SpO2 98% on room air. Initial labs significant for WBC 49.8 (from 12.4 over 2 months ago), unremarkable CMP, normal lipase, lactic acid 2.9->2.9, UA shows large hemoglobinuria, mild ketonuria, moderate proteinuria, negative nitrite, negative leuks, RBC >50, WBC 11-20, many bacteria. EKG shows sinus rhythm with LVH. CT A/P shows sigmoid diverticulosis with evidence of possible acute diverticulitis but no bowel obstruction and known posterior bladder mass. Pt received IV fluid 2 L bolus, IV Rocephin  and IV Flagyl .  Patient placed on Bair hugger until temperature improved to the normal range. [facility] was consulted for admission.   Review of Systems: Please see HPI for pertinent positives and negatives. A complete 10 system review of systems are otherwise negative.  Past Medical History:  Diagnosis Date   Acid reflux    Colon polyps    Glaucoma    History of radiation therapy 09/29/17-11/03/17   45 Gy to the pelvis in 25 fractions   Hypothyroidism    Pneumonia    Uterine cancer [facility])    Past Surgical History:  Procedure Laterality Date   BREAST LUMPECTOMY     2 lumps  removed from rt breast   CESAREAN SECTION     COLONOSCOPY     CYSTOSCOPY W/ RETROGRADES Bilateral 08/25/2017   Procedure: CYSTOSCOPY WITH BILATERAL RETROGRADE PYELOGRAM;  Surgeon: Alvaro Hummer, MD;  Location: [facility];  Service: Urology;  Laterality: Bilateral;   EYE SURGERY     detached retina   HEMORRHOID SURGERY     HYSTERECTOMY ABDOMINAL WITH SALPINGECTOMY  1989   THYROID  SURGERY     growth removed   TRANSURETHRAL RESECTION OF BLADDER TUMOR N/A 08/25/2017   Procedure: TRANSURETHRAL RESECTION OF BLADDER TUMOR (TURBT);  Surgeon: Alvaro Hummer, MD;  Location: [facility];  Service: Urology;  Laterality: N/A;   Social History:  reports that she has never smoked. She has never used smokeless tobacco. She reports current alcohol use. She reports that she does not use drugs.  Allergies  Allergen Reactions   Nystatin -Triamcinolone  Other (See Comments)    Redness, burning in the external vaginal area, broke out all over bottom and vaginal external area.   Azithromycin      GI Upset (intolerance)   Shellfish Allergy Nausea Only   Sulfa Antibiotics Other (See Comments)    Lips felt numb    Family History  Problem Relation Age of Onset   Heart attack Mother    Heart disease Mother    Heart attack Brother    Heart disease Brother    Heart attack Sister    Heart disease Sister    Stroke Father      Prior to Admission medications   Medication Sig Start Date End Date Taking?  Authorizing Provider  acetaminophen  (TYLENOL ) 325 MG tablet Take 2 tablets (650 mg total) by mouth every 6 (six) hours as needed for mild pain. Patient not taking: Reported on 01/08/2022 09/28/18   Will Almarie MATSU, MD  albuterol  (PROVENTIL  HFA;VENTOLIN  HFA) 108 (90 Base) MCG/ACT inhaler Inhale 1-2 puffs into the lungs every 4 (four) hours as needed for wheezing or shortness of breath.    [provider]  Calcium  Carbonate-Vit D-Min (CALTRATE 600+D PLUS MINERALS) 600-800 MG-UNIT TABS Take 1 tablet by mouth  daily.     [provider]  cyanocobalamin  (,VITAMIN B-12,) 1000 MCG/ML injection Inject 1,000 mcg into the muscle every 30 (thirty) days.  Patient not taking: Reported on 01/09/2021 04/14/20   [provider]  famotidine (PEPCID) 40 MG tablet Take 40 mg by mouth daily. 06/11/20   [provider]  gabapentin (NEURONTIN) 100 MG capsule Take 300 mg by mouth at bedtime.  04/17/19   [provider]  latanoprost  (XALATAN ) 0.005 % ophthalmic solution Place 1 drop into both eyes at bedtime.     [provider]  levothyroxine  (SYNTHROID , LEVOTHROID) 50 MCG tablet Take 50 mcg by mouth daily before breakfast.    [provider]  mirtazapine (REMERON) 15 MG tablet Take 15 mg by mouth at bedtime.  Patient not taking: Reported on 01/08/2022 04/17/19   [provider]  ondansetron  (ZOFRAN -ODT) 4 MG disintegrating tablet Take 1 tablet (4 mg total) by mouth every 8 (eight) hours as needed for nausea or vomiting. 12/05/23   Towana Ozell BROCKS, MD  UNABLE TO FIND as needed. Hemp cream    [provider]    Physical Exam: BP 112/62   Pulse 80   Temp 98.6 F (37 C) (Oral)   Resp (!) 23   Ht 5' (1.524 m)   Wt 49 kg   SpO2 96%   BMI 21.09 kg/m  General: Pleasant, chronically weak appearing elderly woman laying in bed. No acute distress. HEENT: /AT. Anicteric sclera. Dry mucous membrane. CV: RRR. No murmurs, rubs, or gallops. No LE edema Pulmonary: Lungs CTAB. Normal effort. No wheezing or rales. Abdominal: Soft, nontender, nondistended. Normal bowel sounds. Extremities: Palpable radial and DP pulses. Normal ROM. Skin: Warm and dry. No obvious rash or lesions. Neuro: A&Ox3. Moves all extremities. Normal sensation to light touch. No focal deficit. Psych: Normal mood and affect          Labs on Admission:  Basic Metabolic Panel: Recent Labs  Lab 02/10/24 1600  NA 140  K 4.0  CL 106  CO2 24  GLUCOSE 136*  BUN 23  CREATININE 0.71   CALCIUM  8.5*   Liver Function Tests: Recent Labs  Lab 02/10/24 1600  AST 21  ALT 12  ALKPHOS 38  BILITOT 0.8  PROT 6.2*  ALBUMIN 3.6   Recent Labs  Lab 02/10/24 1600  LIPASE 30   No results for input(s): AMMONIA in the last 168 hours. CBC: Recent Labs  Lab 02/10/24 1418  WBC 49.8*  HGB 13.7  HCT 42.0  MCV 102.2*  PLT 216   Cardiac Enzymes: No results for input(s): CKTOTAL, CKMB, CKMBINDEX, TROPONINI in the last 168 hours. BNP (last 3 results) No results for input(s): BNP in the last 8760 hours.  ProBNP (last 3 results) No results for input(s): PROBNP in the last 8760 hours.  CBG: No results for input(s): GLUCAP in the last 168 hours.  Radiological Exams on Admission: CT ABDOMEN PELVIS W CONTRAST Result Date: 02/10/2024 CLINICAL DATA:  Abdominal pain. EXAM: CT ABDOMEN AND PELVIS WITH CONTRAST TECHNIQUE: Multidetector CT imaging of the abdomen and pelvis was performed using the standard protocol following bolus administration of intravenous contrast. RADIATION DOSE REDUCTION: This exam was performed according to the departmental dose-optimization program which includes automated exposure control, adjustment of the mA and/or kV according to patient size and/or use of iterative reconstruction technique. CONTRAST:  80mL OMNIPAQUE  IOHEXOL  300 MG/ML  SOLN COMPARISON:  CT abdomen pelvis dated 12/05/2023. FINDINGS: Evaluation of this exam is limited due to respiratory motion. Lower chest: Chronic interstitial coarsening and fibrosis. No intra-abdominal free air or free fluid. Hepatobiliary: The liver is unremarkable. No biliary dilatation. Small gallstone. No pericholecystic fluid. Pancreas: Unremarkable. No pancreatic ductal dilatation or surrounding inflammatory changes. Spleen: Normal in size without focal abnormality. Adrenals/Urinary Tract: The adrenal glands unremarkable. There is no hydronephrosis on either side. Bilateral parapelvic cysts. There is symmetric  enhancement and excretion of contrast by both kidneys. Enhancing 2.1 x 2.7 cm lesion arising from the posterior bladder wall suspicious for malignancy. Cystoscopy was previously recommended. Stomach/Bowel: Moderate size hiatal hernia. There is sigmoid diverticulosis. Mild perisigmoid haziness may be chronic. Correlation with clinical exam recommended to evaluate for possibility of acute diverticulitis. There is no bowel obstruction. The appendix is normal. Vascular/Lymphatic: Advanced aortoiliac atherosclerotic disease. The IVC is unremarkable. No portal venous gas. 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 No adenopathy. Reproductive: Hysterectomy.  No suspicious adnexal masses. Other: Mild diffuse subcutaneous edema. Musculoskeletal: Osteopenia with degenerative changes of the spine. Similar appearance of fracture of S1. No acute osseous pathology. IMPRESSION: 1. Sigmoid diverticulosis. Mild perisigmoid haziness may be chronic. Correlation with clinical exam recommended to evaluate for possibility of acute diverticulitis. No bowel obstruction. Normal appendix. 2. Cholelithiasis. 3. Moderate size hiatal hernia. 4. Enhancing 2.1 x 2.7 cm lesion arising from the posterior bladder wall suspicious for malignancy. Cystoscopy was previously recommended. 5.  Aortic Atherosclerosis (ICD10-I70.0). Electronically Signed   By: Vanetta Chou M.D.   On: 02/10/2024 19:12   Assessment/Plan Tanya Martinez is a 89 y.o. female with medical history significant for hypothyroidism, vitamin B12 deficient anemia, chronic low back pain and GERD who presented to the ED for evaluation of nausea, vomiting and watery stools and admitted for secondary to diverticulitis and UTI.   # Sepsis # Acute diverticulitis - Patient with history of constipation and diverticulosis presented with acute onset of nausea, vomiting, watery stools and abdominal discomfort - Patient nontoxic and  hemodynamically stable on exam - Met sepsis criteria with hypothermia, elevated lactic acid, significant leukocytosis and evidence of intra-abdominal infection - Continue IV Rocephin  and Flagyl  - Start IV hydration with IV NS@100  cc /hr - Follow-up blood and urine cultures - Trend CBC, fever curve  # Acute cystitis - Pt presented with nausea, vomiting, mild abdominal discomfort and generalized weakness - Urinalysis shows signs of infection - Continue IV rocephin  - F/u urine culture - Trend CBC and fever curve  # Hypothyroidism - Continue Synthroid   # Bladder mass - CT showing enhancing 2.1 x 2.7 cm lesion arising from the posterior bladder wall suspicious for malignancy - This is a known bladder mass has been followed by urology with no plans for surgery - Has history of hematuria due to the bladder mass - Continue to monitor  # Generalized weakness - In the setting of acute infections - PT/OT eval and treat - Protein supplementation   DVT prophylaxis: Lovenox   Code Status: Limited: Do not attempt resuscitation (DNR) -DNR-LIMITED -Do Not Intubate/DNI   Consults called: None  Family Communication: Discussed admission with daughter at bedside  Severity of Illness: The appropriate patient status for this patient is OBSERVATION. Observation status is judged to be reasonable and necessary in order to provide the required intensity of service to ensure the patient's safety. The patient's presenting symptoms, physical exam findings, and initial radiographic and laboratory data in the context of their medical condition is felt to place them at decreased risk for further clinical deterioration. Furthermore, it is anticipated that the patient will be medically stable for discharge from the hospital within 2 midnights of admission.   Level of care: Telemetry   This record has been created using Conservation officer, historic buildings. Errors have been sought and corrected, but may not  always be located. Such creation errors do not reflect on the standard of care.   Lou Claretta HERO, MD 02/10/2024, 7:52 PM [facility] Pager: [phone] Isaiah 41:10   If 7PM-7AM, please contact night-coverage [email or web address] Password [email or web address]

## 2024-02-10 NOTE — Progress Notes (Signed)
 Date and time results received: 02/10/24 2253 (use smartphrase .now to insert current time)  Test: other chemical Critical Value: lactic acid = 2.9  Name of Provider Notified: on call MD  Orders Received? Or Actions Taken?: waiting for new orders.

## 2024-02-10 NOTE — ED Notes (Signed)
 Bare hugger placed

## 2024-02-10 NOTE — ED Notes (Signed)
 Bare hugger turned off

## 2024-02-10 NOTE — Discharge Instructions (Signed)
 It was our pleasure to provide your ER care today - we hope that you feel better.  Drink plenty of fluids/stay well hydrated.   Follow up closely with primary care doctor in 1-2 days if symptoms fail to improve/resolve.  Return to ER if worse, new symptoms, fevers, chest pain, trouble breathing, persistent vomiting, severe abdominal pain, or other concern.

## 2024-02-10 NOTE — ED Notes (Signed)
 Pt transported to CT ?

## 2024-02-10 NOTE — ED Provider Notes (Addendum)
 [facility] AT [facility] Provider Note   CSN: [id number] Arrival date & time: 02/10/24  1235     Patient presents with: Abdominal Pain   Tanya Martinez is a 89 y.o. female.   Pt c/o nausea/vomiting and diarrhea onset today. Several episodes of emesis, ?brownish. No abd distension or focal abd pain. 1-2 loose stools, not bloody. Hx endometrial cancer, family indicates is not-operable. No dysuria. No fever or chills. No known ill contacts or bad food ingestion, no antibiotic use.   The history is provided by the patient, a relative, medical records and the EMS personnel. The history is limited by the condition of the patient.  Abdominal Pain Associated symptoms: no chest pain, no chills, no cough, no dysuria, no fever and no shortness of breath        Prior to Admission medications   Medication Sig Start Date End Date Taking? Authorizing Provider  acetaminophen  (TYLENOL ) 325 MG tablet Take 2 tablets (650 mg total) by mouth every 6 (six) hours as needed for mild pain. Patient not taking: Reported on 01/08/2022 09/28/18   Will Almarie MATSU, MD  albuterol  (PROVENTIL  HFA;VENTOLIN  HFA) 108 (90 Base) MCG/ACT inhaler Inhale 1-2 puffs into the lungs every 4 (four) hours as needed for wheezing or shortness of breath.    [provider]  Calcium  Carbonate-Vit D-Min (CALTRATE 600+D PLUS MINERALS) 600-800 MG-UNIT TABS Take 1 tablet by mouth daily.     [provider]  cyanocobalamin  (,VITAMIN B-12,) 1000 MCG/ML injection Inject 1,000 mcg into the muscle every 30 (thirty) days.  Patient not taking: Reported on 01/09/2021 04/14/20   [provider]  famotidine (PEPCID) 40 MG tablet Take 40 mg by mouth daily. 06/11/20   [provider]  gabapentin (NEURONTIN) 100 MG capsule Take 300 mg by mouth at bedtime.  04/17/19   [provider]  latanoprost  (XALATAN ) 0.005 % ophthalmic solution Place 1 drop into both eyes at bedtime.      [provider]  levothyroxine  (SYNTHROID , LEVOTHROID) 50 MCG tablet Take 50 mcg by mouth daily before breakfast.    [provider]  mirtazapine (REMERON) 15 MG tablet Take 15 mg by mouth at bedtime.  Patient not taking: Reported on 01/08/2022 04/17/19   [provider]  ondansetron  (ZOFRAN -ODT) 4 MG disintegrating tablet Take 1 tablet (4 mg total) by mouth every 8 (eight) hours as needed for nausea or vomiting. 12/05/23   Towana Ozell BROCKS, MD  UNABLE TO FIND as needed. Hemp cream    [provider]    Allergies: Nystatin -triamcinolone , Azithromycin , Shellfish allergy, and Sulfa antibiotics    Review of Systems  Constitutional:  Negative for chills and fever.  Respiratory:  Negative for cough and shortness of breath.   Cardiovascular:  Negative for chest pain.  Gastrointestinal:  Positive for abdominal pain.  Genitourinary:  Negative for dysuria and flank pain.  Musculoskeletal:  Negative for back pain.  Neurological:  Negative for headaches.    Updated Vital Signs BP 131/68   Pulse 78   Temp (!) 94.8 F (34.9 C) (Rectal)   Resp (!) 24   Ht 1.524 m (5')   Wt 49 kg   SpO2 98%   BMI 21.09 kg/m   Physical Exam Vitals and nursing note reviewed.  Constitutional:      Appearance: Normal appearance. She is well-developed.  HENT:     Head: Atraumatic.     Nose: Nose normal.     Mouth/Throat:  Mouth: Mucous membranes are moist.   Eyes:     General: No scleral icterus.    Conjunctiva/sclera: Conjunctivae normal.   Neck:     Trachea: No tracheal deviation.   Cardiovascular:     Rate and Rhythm: Normal rate and regular rhythm.     Pulses: Normal pulses.     Heart sounds: Normal heart sounds. No murmur heard.    No friction rub. No gallop.  Pulmonary:     Effort: Pulmonary effort is normal. No respiratory distress.     Breath sounds: Normal breath sounds.  Abdominal:     General: Bowel sounds are normal. There is no distension.      Palpations: Abdomen is soft.     Tenderness: There is no abdominal tenderness. There is no guarding.  Genitourinary:    Comments: No cva tenderness.   Musculoskeletal:        General: No swelling.     Cervical back: Normal range of motion and neck supple. No rigidity. No muscular tenderness.   Skin:    General: Skin is warm and dry.     Findings: No rash.   Neurological:     Mental Status: She is alert.     Comments: Alert, speech normal.   Psychiatric:        Mood and Affect: Mood normal.     (all labs ordered are listed, but only abnormal results are displayed) Results for orders placed or performed during the hospital encounter of 02/10/24  CBC   Collection Time: 02/10/24  2:18 PM  Result Value Ref Range   WBC 49.8 (H) 4.0 - 10.5 K/uL   RBC 4.11 3.87 - 5.11 MIL/uL   Hemoglobin 13.7 12.0 - 15.0 g/dL   HCT 57.9 63.9 - 53.9 %   MCV 102.2 (H) 80.0 - 100.0 fL   MCH 33.3 26.0 - 34.0 pg   MCHC 32.6 30.0 - 36.0 g/dL   RDW 85.1 88.4 - 84.4 %   Platelets 216 150 - 400 K/uL   nRBC 0.0 0.0 - 0.2 %      EKG: EKG Interpretation Date/Time:  Thursday February 10 2024 13:46:33 EDT Ventricular Rate:  87 PR Interval:    QRS Duration:  98 QT Interval:  373 QTC Calculation: 449 R Axis:   -40  Text Interpretation: Sinus rhythm Left anterior fascicular block Confirmed by Bernard Drivers ([id number]) on 02/10/2024 3:06:52 PM  Radiology: No results found.   Procedures   Medications Ordered in the ED  lactated ringers  bolus 1,000 mL (1,000 mLs Intravenous New Bag/Given 02/10/24 1418)  ondansetron  (ZOFRAN ) injection 4 mg (4 mg Intravenous Given 02/10/24 1416)                                    Medical Decision Making Problems Addressed: Dehydration: acute illness or injury with systemic symptoms that poses a threat to life or bodily functions History of endometrial cancer: chronic illness or injury that poses a threat to life or bodily functions Nausea vomiting and diarrhea: acute  illness or injury with systemic symptoms that poses a threat to life or bodily functions  Amount and/or Complexity of Data Reviewed Independent Historian:     Details: Fam, hx External Data Reviewed: notes. Labs: ordered. Decision-making details documented in ED Course. ECG/medicine tests: ordered and independent interpretation performed. Decision-making details documented in ED Course.  Risk Prescription drug management. Decision regarding hospitalization.   Iv  ns. Continuous pulse ox and cardiac monitoring. Labs ordered/sent.   Differential diagnosis includes gastroenteritis, dehydration, food poisoning, sbo, etc. Dispo decision including potential need for admission considered - will get labs and reassess.   Reviewed nursing notes and prior charts for additional history. External reports reviewed. Additional history from: family, ems.   LR bolus. Zofran  iv.   Cardiac monitor: sinus rhythm, rate 78.  Labs reviewed/interpreted by me - wbc high, other labs pnd.   1503, labs and ivf pending.  Signed out to oncoming EDP to check pending labs, recheck patient, and dispo appropriately.  When chem/cr back, will need ct.         Final diagnoses:  Nausea vomiting and diarrhea  Dehydration  History of endometrial cancer    ED Discharge Orders     None           Bernard Drivers, MD 02/10/24 1531

## 2024-02-10 NOTE — ED Triage Notes (Addendum)
 Patient is 22 YOF hx CA c/o nausea/vomiting diarrhea today new onset.  Patient rec'd 4mg  Zofran  PTA.  Patient ambulates with walker and is hard of hearing no hearing aids with her.

## 2024-02-11 ENCOUNTER — Encounter (HOSPITAL_COMMUNITY): Payer: Self-pay | Admitting: Student

## 2024-02-11 DIAGNOSIS — Z602 Problems related to living alone: Secondary | ICD-10-CM | POA: Diagnosis present

## 2024-02-11 DIAGNOSIS — N938 Other specified abnormal uterine and vaginal bleeding: Secondary | ICD-10-CM | POA: Diagnosis present

## 2024-02-11 DIAGNOSIS — E872 Acidosis, unspecified: Secondary | ICD-10-CM | POA: Diagnosis present

## 2024-02-11 DIAGNOSIS — N3289 Other specified disorders of bladder: Secondary | ICD-10-CM

## 2024-02-11 DIAGNOSIS — Z91013 Allergy to seafood: Secondary | ICD-10-CM | POA: Diagnosis not present

## 2024-02-11 DIAGNOSIS — Z888 Allergy status to other drugs, medicaments and biological substances status: Secondary | ICD-10-CM | POA: Diagnosis not present

## 2024-02-11 DIAGNOSIS — Z823 Family history of stroke: Secondary | ICD-10-CM | POA: Diagnosis not present

## 2024-02-11 DIAGNOSIS — N390 Urinary tract infection, site not specified: Secondary | ICD-10-CM | POA: Diagnosis present

## 2024-02-11 DIAGNOSIS — K5792 Diverticulitis of intestine, part unspecified, without perforation or abscess without bleeding: Secondary | ICD-10-CM

## 2024-02-11 DIAGNOSIS — Z882 Allergy status to sulfonamides status: Secondary | ICD-10-CM | POA: Diagnosis not present

## 2024-02-11 DIAGNOSIS — E039 Hypothyroidism, unspecified: Secondary | ICD-10-CM | POA: Diagnosis present

## 2024-02-11 DIAGNOSIS — E860 Dehydration: Secondary | ICD-10-CM | POA: Diagnosis present

## 2024-02-11 DIAGNOSIS — R112 Nausea with vomiting, unspecified: Principal | ICD-10-CM

## 2024-02-11 DIAGNOSIS — E538 Deficiency of other specified B group vitamins: Secondary | ICD-10-CM | POA: Diagnosis present

## 2024-02-11 DIAGNOSIS — Z90710 Acquired absence of both cervix and uterus: Secondary | ICD-10-CM | POA: Diagnosis not present

## 2024-02-11 DIAGNOSIS — Z79899 Other long term (current) drug therapy: Secondary | ICD-10-CM | POA: Diagnosis not present

## 2024-02-11 DIAGNOSIS — A419 Sepsis, unspecified organism: Secondary | ICD-10-CM | POA: Diagnosis present

## 2024-02-11 DIAGNOSIS — K5732 Diverticulitis of large intestine without perforation or abscess without bleeding: Secondary | ICD-10-CM | POA: Diagnosis present

## 2024-02-11 DIAGNOSIS — Z860100 Personal history of colon polyps, unspecified: Secondary | ICD-10-CM | POA: Diagnosis not present

## 2024-02-11 DIAGNOSIS — G8929 Other chronic pain: Secondary | ICD-10-CM | POA: Diagnosis present

## 2024-02-11 DIAGNOSIS — Z79890 Hormone replacement therapy (postmenopausal): Secondary | ICD-10-CM | POA: Diagnosis not present

## 2024-02-11 DIAGNOSIS — Z66 Do not resuscitate: Secondary | ICD-10-CM | POA: Diagnosis present

## 2024-02-11 DIAGNOSIS — Z881 Allergy status to other antibiotic agents status: Secondary | ICD-10-CM | POA: Diagnosis not present

## 2024-02-11 DIAGNOSIS — Z8542 Personal history of malignant neoplasm of other parts of uterus: Secondary | ICD-10-CM | POA: Diagnosis not present

## 2024-02-11 DIAGNOSIS — K219 Gastro-esophageal reflux disease without esophagitis: Secondary | ICD-10-CM | POA: Diagnosis present

## 2024-02-11 DIAGNOSIS — R531 Weakness: Secondary | ICD-10-CM

## 2024-02-11 DIAGNOSIS — Z923 Personal history of irradiation: Secondary | ICD-10-CM | POA: Diagnosis not present

## 2024-02-11 DIAGNOSIS — N329 Bladder disorder, unspecified: Secondary | ICD-10-CM | POA: Diagnosis present

## 2024-02-11 LAB — BASIC METABOLIC PANEL WITH GFR
Anion gap: 8 (ref 5–15)
BUN: 22 mg/dL (ref 8–23)
CO2: 20 mmol/L — ABNORMAL LOW (ref 22–32)
Calcium: 7.8 mg/dL — ABNORMAL LOW (ref 8.9–10.3)
Chloride: 111 mmol/L (ref 98–111)
Creatinine, Ser: 0.7 mg/dL (ref 0.44–1.00)
GFR, Estimated: >60 mL/min (ref >60)
Glucose, Bld: 94 mg/dL (ref 70–99)
Potassium: 3.8 mmol/L (ref 3.5–5.1)
Sodium: 139 mmol/L (ref 135–145)

## 2024-02-11 LAB — CBC
HCT: 29.9 % — ABNORMAL LOW (ref 36.0–46.0)
Hemoglobin: 9.8 g/dL — ABNORMAL LOW (ref 12.0–15.0)
MCH: 32.6 pg (ref 26.0–34.0)
MCHC: 32.8 g/dL (ref 30.0–36.0)
MCV: 99.3 fL (ref 80.0–100.0)
Platelets: 147 10*3/uL — ABNORMAL LOW (ref 150–400)
RBC: 3.01 MIL/uL — ABNORMAL LOW (ref 3.87–5.11)
RDW: 15 % (ref 11.5–15.5)
WBC: 19.8 10*3/uL — ABNORMAL HIGH (ref 4.0–10.5)
nRBC: 0 % (ref 0.0–0.2)

## 2024-02-11 LAB — URINE CULTURE

## 2024-02-11 MED ORDER — DICLOFENAC SODIUM 1 % EX GEL: 4.0000 g | Freq: Three times a day (TID) | CUTANEOUS | Status: DC | PRN

## 2024-02-11 MED ORDER — METRONIDAZOLE 500 MG/100ML IV SOLN
500.0000 mg | Freq: Two times a day (BID) | INTRAVENOUS | Status: DC
  Administered 2024-02-11 – 2024-02-12 (×3): 500 mg via INTRAVENOUS
  Filled 2024-02-11 (×3): qty 100

## 2024-02-11 MED ORDER — SODIUM CHLORIDE 0.9 % IV SOLN
2.0000 g | INTRAVENOUS | Status: DC
  Administered 2024-02-11: 2 g via INTRAVENOUS
  Filled 2024-02-11: qty 20

## 2024-02-11 NOTE — Plan of Care (Signed)
  Problem: Education: Goal: Knowledge of General Education information will improve Description: Including pain rating scale, medication(s)/side effects and non-pharmacologic comfort measures Outcome: Progressing   Problem: Health Behavior/Discharge Planning: Goal: Ability to manage health-related needs will improve Outcome: Progressing   Problem: Clinical Measurements: Goal: Will remain free from infection Outcome: Progressing Goal: Diagnostic test results will improve Outcome: Progressing   Problem: Activity: Goal: Risk for activity intolerance will decrease Outcome: Progressing   Problem: Nutrition: Goal: Adequate nutrition will be maintained Outcome: Progressing   Problem: Elimination: Goal: Will not experience complications related to urinary retention Outcome: Progressing   Problem: Pain Managment: Goal: General experience of comfort will improve and/or be controlled Outcome: Progressing

## 2024-02-11 NOTE — TOC Initial Note (Signed)
 Transition of Care [facility]) - Initial/Assessment Note    Patient Details  Name: Tanya Martinez MRN: [id number] Date of Birth: 12/18/1922  Transition of Care [facility]) CM/SW Contact:    Hoy DELENA Bigness, LCSW Phone Number: 02/11/2024, 2:48 PM  Clinical Narrative:                 Pt from home alone. Pt ambulates with RW and rollator at baseline. Pt has PCA 1x/wk however, daughter plans to increase services to 6x/wk. OT currently recommending HHOT and transfer w/c.  TOC will continue to follow for PT recommendation.   Expected Discharge Plan: Home w Home Health Services Barriers to Discharge: Continued Medical Work up   Patient Goals and CMS Choice Patient states their goals for this hospitalization and ongoing recovery are:: For pt to return home with additional services          Expected Discharge Plan and Services In-house Referral: Clinical Social Work Discharge Planning Services: NA Post Acute Care Choice: Home Health, Durable Medical Equipment Living arrangements for the past 2 months: Single Family Home                                      Prior Living Arrangements/Services Living arrangements for the past 2 months: Single Family Home Lives with:: Self Patient language and need for interpreter reviewed:: Yes Do you feel safe going back to the place where you live?: Yes      Need for Family Participation in Patient Care: No (Comment) Care giver support system in place?: Yes (comment) Current home services: DME, Other (comment) (RW, rollator; PCA 1x/wk) Criminal Activity/Legal Involvement Pertinent to Current Situation/Hospitalization: No - Comment as needed  Activities of Daily Living   ADL Screening (condition at time of admission) Independently performs ADLs?: Yes (appropriate for developmental age) Is the patient deaf or have difficulty hearing?: No Does the patient have difficulty seeing, even when wearing glasses/contacts?: No Does the patient have difficulty  concentrating, remembering, or making decisions?: No  Permission Sought/Granted Permission sought to share information with : Family Supports Permission granted to share information with : Yes, Verbal Permission Granted  Share Information with NAME: Jessy Mom (Daughter)  [phone]  Permission granted to share info w AGENCY: [facility]        Emotional Assessment Appearance:: Appears stated age     Orientation: : Oriented to Self, Oriented to Place, Oriented to  Time, Oriented to Situation Alcohol / Substance Use: Not Applicable Psych Involvement: No (comment)  Admission diagnosis:  Dehydration [E86.0] Diverticulitis [K57.92] Bladder mass [N32.89] History of endometrial cancer [Z85.42] Nausea vomiting and diarrhea [R11.2, R19.7] Sepsis (HCC) [A41.9] Sepsis without acute organ dysfunction, due to unspecified organism [facility]) [A41.9] Patient Active Problem List   Diagnosis Date Noted   Diverticulitis 02/11/2024   Nausea vomiting and diarrhea 02/11/2024   Bladder mass 02/11/2024   Generalized weakness 02/11/2024   Sepsis (HCC) 02/10/2024   Hip pain, bilateral 09/24/2018   Low back pain 09/24/2018   Leukocytosis 09/24/2018   Bladder cancer (HCC) 08/25/2017   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 Jewell BROCKS [place] [address] Phone: [phone] Fax: [phone]  DARRYLE LONG - [facility] [address] [place] [address] Phone: [phone] Fax: [phone]     Social Drivers of Health (SDOH) Social History: SDOH Screenings   Food Insecurity: No Food Insecurity (02/10/2024)  Housing: Low Risk  (02/10/2024)  Transportation Needs: No Transportation Needs (02/10/2024)  Utilities: Not At Risk (02/10/2024)  Physical Activity: Insufficiently Active (07/22/2017)  Social Connections: Unknown (02/10/2024)  Tobacco Use: Low Risk  (02/10/2024)   SDOH Interventions: Food Insecurity Interventions: Intervention Not Indicated Housing Interventions: Intervention Not Indicated Transportation Interventions: Intervention Not Indicated   Readmission Risk Interventions    02/11/2024    2:45 PM  Readmission Risk Prevention Plan  Transportation Screening Complete  PCP or Specialist Appt within 5-7 Days Complete  Home Care Screening Complete  Medication Review (RN CM) Complete

## 2024-02-11 NOTE — Evaluation (Signed)
 Physical Therapy Evaluation Patient Details Name: Tanya Martinez MRN: [id number] DOB: 09-04-1922 Today's Date: 02/11/2024  History of Present Illness  Tanya Martinez is a 89 y.o. female who presented to the ED with daughter for evaluation of nausea, vomiting and watery stools and admitted for secondary to diverticulitis and UTI. PMH: hypothyroidism, vitamin B12 deficient anemia, chronic low back pain and GERD, uterine CA  Clinical Impression  Pt admitted with above diagnosis. Pt from home, modified ind with RW in the home and rollator outside, daughter and caregiver assist as needed. On eval, pt completes bed mobility, transfers, and amb 30 ft in room with RW with CGA for safety. Pt able to maintain static standing for pericare with close supv. Pt's daughter present, reports no current 24/7 support caregiver and inquiring about increasing care to 24/7 upon d/c. Recommend HHPT at d/c. Pt currently with functional limitations due to the deficits listed below (see PT Problem List). Pt will benefit from acute skilled PT to increase their independence and safety with mobility to allow discharge.           If plan is discharge home, recommend the following: A little help with walking and/or transfers;A little help with bathing/dressing/bathroom;Assistance with cooking/housework;Assist for transportation;Help with stairs or ramp for entrance   Can travel by private vehicle        Equipment Recommendations Other (comment) (transport chair)  Recommendations for Other Services       Functional Status Assessment Patient has had a recent decline in their functional status and demonstrates the ability to make significant improvements in function in a reasonable and predictable amount of time.     Precautions / Restrictions Precautions Precautions: Fall Restrictions Weight Bearing Restrictions Per Provider Order: No      Mobility  Bed Mobility Overal bed mobility: Needs Assistance Bed Mobility:  Supine to Sit     Supine to sit: Contact guard, HOB elevated, Used rails     General bed mobility comments: cues for hand placement, increased time and effort    Transfers Overall transfer level: Needs assistance Equipment used: Rolling walker (2 wheels) Transfers: Sit to/from Stand Sit to Stand: Contact guard assist           General transfer comment: cues for hand placement to push to power up, slow to rise, CGA for safety    Ambulation/Gait Ambulation/Gait assistance: Contact guard assist Gait Distance (Feet): 30 Feet Assistive device: Rolling walker (2 wheels) Gait Pattern/deviations: Step-through pattern, Decreased stride length Gait velocity: decreased     General Gait Details: short, slow steps in room with RW, no LOB, able to complete turns and navigate around furniture without LOB  Stairs            Wheelchair Mobility     Tilt Bed    Modified Rankin (Stroke Patients Only)       Balance Overall balance assessment: Needs assistance Sitting-balance support: Feet supported Sitting balance-Leahy Scale: Good     Standing balance support: Bilateral upper extremity supported, During functional activity, Reliant on assistive device for balance Standing balance-Leahy Scale: Poor                               Pertinent Vitals/Pain Pain Assessment Pain Assessment: Faces Faces Pain Scale: Hurts a little bit Pain Location: low back in supine Pain Descriptors / Indicators: Discomfort Pain Intervention(s): Limited activity within patient's tolerance, Monitored during session, Repositioned    Home Living Family/patient  expects to be discharged to:: Private residence Living Arrangements: Alone Available Help at Discharge: Family;Available 24 hours/day Type of Home: House Home Access: Stairs to enter Entrance Stairs-Rails:  (grab bar) Entrance Stairs-Number of Steps: 1 Alternate Level Stairs-Number of Steps: FF, stairglide Home Layout:  Two level Home Equipment: Agricultural consultant (2 wheels);Rollator (4 wheels);Cane - single point;BSC/3in1;Shower seat;Grab bars - toilet;Grab bars - tub/shower;Hand held shower head;Adaptive equipment;Wheelchair - manual (multiple RW) Additional Comments: sleeps upstairs reg bed    Prior Function Prior Level of Function : Independent/Modified Independent             Mobility Comments: modified ind with RW in the home and rollator outside ADLs Comments: mod I BADL's, heats prepared meals and light b-fast/lunch, dtr and pca 1x per week that dtr is increasing pca prob 6 days     Extremity/Trunk Assessment   Upper Extremity Assessment Upper Extremity Assessment: Defer to OT evaluation    Lower Extremity Assessment Lower Extremity Assessment: Generalized weakness (AROM WFL, strength grossly 3+/5, denies numbness/tingling throughout BLE)       Communication   Communication Communication: Impaired Factors Affecting Communication: Hearing impaired;Other (comment) (refuses hearing aides)    Cognition Arousal: Alert Behavior During Therapy: WFL for tasks assessed/performed   PT - Cognitive impairments: No apparent impairments                         Following commands: Intact       Cueing       General Comments      Exercises     Assessment/Plan    PT Assessment Patient needs continued PT services  PT Problem List Decreased strength;Decreased activity tolerance;Decreased balance;Decreased knowledge of use of DME;Decreased safety awareness       PT Treatment Interventions DME instruction;Gait training;Stair training;Functional mobility training;Therapeutic activities;Therapeutic exercise;Balance training;Cognitive remediation;Patient/family education    PT Goals (Current goals can be found in the Care Plan section)  Acute Rehab PT Goals Patient Stated Goal: return home with caregiver and family support PT Goal Formulation: With patient/family Time For Goal  Achievement: 02/25/24 Potential to Achieve Goals: Good    Frequency Min 3X/week     Co-evaluation               AM-PAC PT 6 Clicks Mobility  Outcome Measure Help needed turning from your back to your side while in a flat bed without using bedrails?: A Little Help needed moving from lying on your back to sitting on the side of a flat bed without using bedrails?: A Little Help needed moving to and from a bed to a chair (including a wheelchair)?: A Little Help needed standing up from a chair using your arms (e.g., wheelchair or bedside chair)?: A Little Help needed to walk in hospital room?: A Little Help needed climbing 3-5 steps with a railing? : A Lot 6 Click Score: 17    End of Session Equipment Utilized During Treatment: Gait belt Activity Tolerance: Patient tolerated treatment well Patient left: in chair;with call bell/phone within reach;with family/visitor present Nurse Communication: Mobility status PT Visit Diagnosis: Other abnormalities of gait and mobility (R26.89);Muscle weakness (generalized) (M62.81)    Time: 8473-8444 PT Time Calculation (min) (ACUTE ONLY): 29 min   Charges:   PT Evaluation $PT Eval Low Complexity: 1 Low PT Treatments $Gait Training: 8-22 mins PT General Charges $$ ACUTE PT VISIT: 1 Visit         Tori Litha Lamartina PT, DPT 02/11/24, 4:18 PM

## 2024-02-11 NOTE — Plan of Care (Signed)
  Problem: Activity: Goal: Risk for activity intolerance will decrease Outcome: Progressing   Problem: Nutrition: Goal: Adequate nutrition will be maintained Outcome: Progressing   Problem: Coping: Goal: Level of anxiety will decrease Outcome: Progressing   Problem: Pain Managment: Goal: General experience of comfort will improve and/or be controlled Outcome: Progressing   Problem: Safety: Goal: Ability to remain free from injury will improve Outcome: Progressing

## 2024-02-11 NOTE — Progress Notes (Signed)
 PROGRESS NOTE  Tanya Martinez  FMW:[id number] DOB: 1922/12/31 DOA: 02/10/2024 PCP: Burney Darice CROME, MD   Brief Narrative: Patient is a 89 year old female with history of hypothyroidism, vitamin B12 deficiency, chronic back pain, GERD, endometrial cancer who presented for the evaluation of nausea, vomiting, watery stools.  On presentation, she was hemodynamically stable.  Lab work showed severe leukocytosis, elevated lactate, UA was suspicious for UTI.  CT abdomen/pelvis showed sigmoid diverticulosis with evidence of possible acute diverticulitis but no bowel obstruction and known posterior bladder mass.  Started on IV fluid, antibiotics.  Assessment & Plan:  Principal Problem:   Sepsis (HCC) Active Problems:   Diverticulitis   Nausea vomiting and diarrhea   Bladder mass   Generalized weakness  Sepsis secondary to acute diverticulitis: History of constipation, diverticulosis.  Presented with nausea, vomiting, watery stools, abdominal discomfort.  Severe leukocytosis, elevated lactate on presentation.  CT abdomen/pelvis suggestive of possible acute diverticulitis.  Started on ceftriaxone  and Flagyl .  Continue IV fluid.  Follow-up cultures. On examination, patient does not have any abdominal tenderness.  She denied any abdominal pain, nausea or vomiting this morning.  Continue soft diet for now.  Suspicion for UTI: Had nausea, vomiting, weakness on presentation.  UA suspicious for UTI.  Continue current antibiotic.  Follow-up urine culture.  Hypothyroidism: Continue Synthyroid  Endometrial cancer/bladder mass: CT abdomen/pelvis showed 2.1X 2.7 cm lesion arising from the posterior bladder wall suspicious for malignancy.  This is a known bladder mass followed by urology, no plan for surgery. Patient also has history of endometrial cancer and has chronic vaginal bleeding  Generalized weakness: PT/OT consulted.  She lives alone.  Daughter lives nearby.  Daughter unable to take care of her  24/7         DVT prophylaxis:enoxaparin  (LOVENOX ) injection 30 mg Start: 02/10/24 2200     Code Status: Limited: Do not attempt resuscitation (DNR) -DNR-LIMITED -Do Not Intubate/DNI   Family Communication: Discussed with daughter at bedside on 6/27  Patient status: Observation  Patient is from : Home  Anticipated discharge to: Home versus SNF  Estimated DC date: 1 to 2 days   Consultants: None  Procedures: None   Antimicrobials:  Anti-infectives (From admission, onward)    Start     Dose/Rate Route Frequency Ordered Stop   02/11/24 1800  cefTRIAXone  (ROCEPHIN ) 2 g in sodium chloride  0.9 % 100 mL IVPB        2 g 200 mL/hr over 30 Minutes Intravenous Every 24 hours 02/11/24 0111     02/11/24 0800  metroNIDAZOLE  (FLAGYL ) IVPB 500 mg        500 mg 100 mL/hr over 60 Minutes Intravenous Every 12 hours 02/11/24 0111     02/10/24 1930  metroNIDAZOLE  (FLAGYL ) IVPB 500 mg        500 mg 100 mL/hr over 60 Minutes Intravenous  Once 02/10/24 1924 02/10/24 2300   02/10/24 1745  cefTRIAXone  (ROCEPHIN ) 2 g in sodium chloride  0.9 % 100 mL IVPB        2 g 200 mL/hr over 30 Minutes Intravenous Once 02/10/24 1732 02/10/24 1930       Subjective: Patient seen and examined at bedside today.  During my evaluation, she was working with physical therapy.  She looks overall comfortable.  No nausea or vomiting.  Denies abdomen pain.  Observed to have chronic vaginal bleeding.  Abdomen is soft and nontender.  Complains of some pain on the left ankle.  Left ankle appears nonerythematous, nonedematous  Objective: Vitals:  02/10/24 1733 02/10/24 2048 02/11/24 0037 02/11/24 0503  BP: 112/62 (!) 103/44 (!) 103/47 (!) 115/57  Pulse: 80 73 64 63  Resp: (!) 23 16 18 17   Temp:  99.2 F (37.3 C) 99.4 F (37.4 C) 99.7 F (37.6 C)  TempSrc:  Oral  Oral  SpO2: 96% 97% 96% 94%  Weight:      Height:        Intake/Output Summary (Last 24 hours) at 02/11/2024 0817 Last data filed at 02/11/2024  0500 Gross per 24 hour  Intake 3959.32 ml  Output 300 ml  Net 3659.32 ml   Filed Weights   02/10/24 1249  Weight: 49 kg    Examination:  General exam: Overall comfortable, not in distress, pleasant elderly female, deconditioned HEENT: PERRL Respiratory system:  no wheezes or crackles  Cardiovascular system: S1 & S2 heard, RRR.  Gastrointestinal system: Abdomen is nondistended, soft and nontender. Central nervous system: Alert and awake, oriented to place Extremities: No edema, no clubbing ,no cyanosis Skin: No rashes, no ulcers,no icterus     Data Reviewed: I have personally reviewed following labs and imaging studies  CBC: Recent Labs  Lab 02/10/24 1418 02/11/24 0653  WBC 49.8* 19.8*  HGB 13.7 9.8*  HCT 42.0 29.9*  MCV 102.2* 99.3  PLT 216 147*   Basic Metabolic Panel: Recent Labs  Lab 02/10/24 1600 02/11/24 0511  NA 140 139  K 4.0 3.8  CL 106 111  CO2 24 20*  GLUCOSE 136* 94  BUN 23 22  CREATININE 0.71 0.70  CALCIUM  8.5* 7.8*     Recent Results (from the past 240 hours)  Culture, blood (routine x 2)     Status: None (Preliminary result)   Collection Time: 02/10/24  9:21 PM   Specimen: BLOOD RIGHT ARM  Result Value Ref Range Status   Specimen Description   Final    BLOOD RIGHT ARM Performed at [facility], [address] [place], [place] [address]    Special Requests   Final    BOTTLES DRAWN AEROBIC AND ANAEROBIC Blood Culture results may not be optimal due to an inadequate volume of blood received in culture bottles Performed at [facility], [address], [place], [place] [address]    Culture PENDING  Incomplete   Report Status PENDING  Incomplete     Radiology Studies: CT ABDOMEN PELVIS W CONTRAST Result Date: 02/10/2024 CLINICAL DATA:  Abdominal pain. EXAM: CT ABDOMEN AND PELVIS WITH CONTRAST TECHNIQUE: Multidetector CT imaging of the abdomen and pelvis was performed using the standard protocol following bolus  administration of intravenous contrast. RADIATION DOSE REDUCTION: This exam was performed according to the departmental dose-optimization program which includes automated exposure control, adjustment of the mA and/or kV according to patient size and/or use of iterative reconstruction technique. CONTRAST:  80mL OMNIPAQUE  IOHEXOL  300 MG/ML  SOLN COMPARISON:  CT abdomen pelvis dated 12/05/2023. FINDINGS: Evaluation of this exam is limited due to respiratory motion. Lower chest: Chronic interstitial coarsening and fibrosis. No intra-abdominal free air or free fluid. Hepatobiliary: The liver is unremarkable. No biliary dilatation. Small gallstone. No pericholecystic fluid. Pancreas: Unremarkable. No pancreatic ductal dilatation or surrounding inflammatory changes. Spleen: Normal in size without focal abnormality. Adrenals/Urinary Tract: The adrenal glands unremarkable. There is no hydronephrosis on either side. Bilateral parapelvic cysts. There is symmetric enhancement and excretion of contrast by both kidneys. Enhancing 2.1 x 2.7 cm lesion arising from the posterior bladder wall suspicious for malignancy. Cystoscopy was previously recommended.  Stomach/Bowel: Moderate size hiatal hernia. There is sigmoid diverticulosis. Mild perisigmoid haziness may be chronic. Correlation with clinical exam recommended to evaluate for possibility of acute diverticulitis. There is no bowel obstruction. The appendix is normal. Vascular/Lymphatic: Advanced aortoiliac atherosclerotic disease. The IVC is unremarkable. No portal venous gas. 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 No adenopathy. Reproductive: Hysterectomy.  No suspicious adnexal masses. Other: Mild diffuse subcutaneous edema. Musculoskeletal: Osteopenia with degenerative changes of the spine. Similar appearance of fracture of S1. No acute osseous pathology. IMPRESSION: 1. Sigmoid diverticulosis. Mild perisigmoid  haziness may be chronic. Correlation with clinical exam recommended to evaluate for possibility of acute diverticulitis. No bowel obstruction. Normal appendix. 2. Cholelithiasis. 3. Moderate size hiatal hernia. 4. Enhancing 2.1 x 2.7 cm lesion arising from the posterior bladder wall suspicious for malignancy. Cystoscopy was previously recommended. 5.  Aortic Atherosclerosis (ICD10-I70.0). Electronically Signed   By: Vanetta Chou M.D.   On: 02/10/2024 19:12    Scheduled Meds:  enoxaparin  (LOVENOX ) injection  30 mg Subcutaneous Q24H   feeding supplement  237 mL Oral TID BM   latanoprost   1 drop Both Eyes QHS   levothyroxine   50 mcg Oral QAC breakfast   Continuous Infusions:  sodium chloride  100 mL/hr at 02/11/24 0428   cefTRIAXone  (ROCEPHIN )  IV     metronidazole        LOS: 0 days   Ivonne Mustache, MD [facility] P6/27/2025, 8:17 AM

## 2024-02-11 NOTE — Evaluation (Signed)
 Occupational Therapy Evaluation Patient Details Name: Tanya Martinez MRN: [id number] DOB: [birth year] Today's Date: 02/11/2024   History of Present Illness   Tanya Martinez is a 89 y.o. female with medical history significant for hypothyroidism, vitamin B12 deficient anemia, chronic low back pain and GERD who presented to the ED with daughter for evaluation of nausea, vomiting and watery stools and admitted for secondary to diverticulitis and UTI.     Clinical Impressions PTA, patient was living at home alone with daughter and PCA services (1x per week) assisting as needed. Patient has a stair lift to 2nd floor bedroom and was using RW and rollator placed throughout home to complete BADL's and light AM and lunch meals and reheating prepped meals for dinner. Daughter lives nearby but works so she is planning on hiring increased PCA services 6x per week. Currently, patient presents with deficits outlined below (see OT Problem List for details) most significantly new onset L ankle pain without injury (assessed by MD), mild decreased cognition, HOH, generalized muscle weakness, decreased balance and activity tolerance impacting ADL's and functional mobility. Discussed rehab vs home with therapy at length with dtr as OT recommending assist and support upon discharge from hospital. Dtr interested in a transport w/c and if she can hire increased PCA, HHOT services. Patient requires increased Acute level OT services to progress function and safety and allow for discharge.      If plan is discharge home, recommend the following:   A little help with walking and/or transfers;A little help with bathing/dressing/bathroom;Assistance with cooking/housework;Direct supervision/assist for medications management;Direct supervision/assist for financial management;Assist for transportation;Help with stairs or ramp for entrance;Supervision due to cognitive status     Functional Status Assessment   Patient has had a  recent decline in their functional status and demonstrates the ability to make significant improvements in function in a reasonable and predictable amount of time.     Equipment Recommendations   Other (comment) (transport wheelchair)      Precautions/Restrictions   Precautions Precautions: Fall     Mobility Bed Mobility Overal bed mobility: Needs Assistance Bed Mobility: Supine to Sit     Supine to sit: Contact guard, HOB elevated, Used rails     General bed mobility comments: min cues for hand placement    Transfers Overall transfer level: Needs assistance Equipment used: Rolling walker (2 wheels) Transfers: Sit to/from Stand, Bed to chair/wheelchair/BSC Sit to Stand: Contact guard assist     Step pivot transfers: Contact guard assist     General transfer comment: min cues for hand placement, improvement with standing and ambulation once up, able to amb forward and backwards with RW 10 steps and march in place with CGA      Balance Overall balance assessment: Needs assistance Sitting-balance support: Feet supported Sitting balance-Leahy Scale: Good     Standing balance support: Bilateral upper extremity supported, During functional activity, Reliant on assistive device for balance Standing balance-Leahy Scale: Poor                             ADL either performed or assessed with clinical judgement   ADL Overall ADL's : Needs assistance/impaired Eating/Feeding: Set up;Sitting   Grooming: Wash/dry hands;Wash/dry face;Oral care;Sitting;Set up   Upper Body Bathing: Set up;Sitting   Lower Body Bathing: Sit to/from stand;Moderate assistance   Upper Body Dressing : Set up;Sitting   Lower Body Dressing: Moderate assistance;Sit to/from stand   Toilet Transfer: Contact guard assist;Rolling walker (  2 wheels);Cueing for sequencing Toilet Transfer Details (indicate cue type and reason): placement of bedside commode rec to nursing for  now Toileting- Clothing Manipulation and Hygiene: Moderate assistance;Sit to/from stand Toileting - Clothing Manipulation Details (indicate cue type and reason): has purewick and chronic vaginal bleeding at baseline     Functional mobility during ADLs: Contact guard assist;Rolling walker (2 wheels) General ADL Comments: decreased reach and peri hygiene management     Vision Baseline Vision/History: 5 Retinopathy;6 Macular Degeneration;1 Wears glasses Ability to See in Adequate Light: 1 Impaired Patient Visual Report: Blurring of vision;Central vision impairment Vision Assessment?: Wears glasses for reading            Pertinent Vitals/Pain Pain Assessment Pain Assessment: 0-10 Pain Score: 6  Pain Location: L ankle Pain Descriptors / Indicators: Aching, Sore Pain Intervention(s): Monitored during session, Repositioned, Patient requesting pain meds-RN notified, RN gave pain meds during session     Extremity/Trunk Assessment Upper Extremity Assessment Upper Extremity Assessment: Generalized weakness;Right hand dominant   Lower Extremity Assessment Lower Extremity Assessment: Generalized weakness;LLE deficits/detail LLE Deficits / Details: bedding tight and bed baseboard pressing in on patient's foot upon OT arrival, with ROM and mobility as well as meds releif reported, MD assessed for diagnostics with no need reported       Communication Communication Communication: Impaired Factors Affecting Communication: Hearing impaired;Other (comment) (refuses hearing aides)   Cognition Arousal: Alert Behavior During Therapy: WFL for tasks assessed/performed Cognition: Cognition impaired   Orientation impairments: Time Awareness: Online awareness impaired Memory impairment (select all impairments): Short-term memory Attention impairment (select first level of impairment): Selective attention Executive functioning impairment (select all impairments): Problem solving OT - Cognition  Comments: dtr confirms mild decrease in processing and awareness since illness but typically no issues                 Following commands: Intact       Cueing  General Comments   Cueing Techniques: Verbal cues;Tactile cues;Visual cues;Gestural cues  98% SpO2 on RA , no skin issues, low BMI with dtr reporting low appetite           Home Living Family/patient expects to be discharged to:: Private residence Living Arrangements: Alone Available Help at Discharge: Family;Available 24 hours/day Type of Home: House Home Access: Stairs to enter [employer] of Steps: 1 Entrance Stairs-Rails:  (grab bar) Home Layout: Two level Alternate Level Stairs-Number of Steps: FF, stairglide Alternate Level Stairs-Rails: Right Bathroom Shower/Tub: Walk-in shower;Door   [employer] Toilet: Handicapped height Bathroom Accessibility: Yes How Accessible: Accessible via walker Home Equipment: Agricultural consultant (2 wheels);Rollator (4 wheels);Cane - single point;BSC/3in1;Shower seat;Grab bars - toilet;Grab bars - tub/shower;Hand held shower head;Adaptive equipment;Wheelchair - manual (multiple RW) Adaptive Equipment: Reacher Additional Comments: sleeps upstairs reg bed      Prior Functioning/Environment Prior Level of Function : Independent/Modified Independent             Mobility Comments: mod I walkers ADLs Comments: mod I BADL's, heats prepared meals and light b-fast/lunch, dtr and pca 1x per week that dtr is increasing pca prob 6 days    OT Problem List: Decreased strength;Decreased activity tolerance;Impaired balance (sitting and/or standing);Impaired vision/perception;Decreased cognition;Decreased safety awareness;Cardiopulmonary status limiting activity;Pain   OT Treatment/Interventions: Self-care/ADL training;Therapeutic exercise;Neuromuscular education;Energy conservation;DME and/or AE instruction;Therapeutic activities;Cognitive remediation/compensation;Patient/family  education;Balance training      OT Goals(Current goals can be found in the care plan section)   Acute Rehab OT Goals Patient Stated Goal: to go home OT Goal Formulation:  With patient/family Time For Goal Achievement: 02/25/24 Potential to Achieve Goals: Good ADL Goals Pt Will Perform Lower Body Bathing: with contact guard assist;sit to/from stand Pt Will Perform Lower Body Dressing: with contact guard assist;sit to/from stand Pt Will Transfer to Toilet: ambulating;with contact guard assist;grab bars Pt Will Perform Toileting - Clothing Manipulation and hygiene: with contact guard assist;sit to/from stand Pt Will Perform Tub/Shower Transfer: with min assist;Shower transfer;ambulating;shower seat;rolling walker;grab bars Pt/caregiver will Perform Home Exercise Program: Increased strength;Both right and left upper extremity;With Supervision;With written HEP provided   OT Frequency:  Min 2X/week       AM-PAC OT 6 Clicks Daily Activity     Outcome Measure Help from another person eating meals?: A Little Help from another person taking care of personal grooming?: A Little Help from another person toileting, which includes using toliet, bedpan, or urinal?: A Lot Help from another person bathing (including washing, rinsing, drying)?: A Little Help from another person to put on and taking off regular upper body clothing?: A Little Help from another person to put on and taking off regular lower body clothing?: A Little 6 Click Score: 17   End of Session Equipment Utilized During Treatment: Gait belt;Rolling walker (2 wheels) Nurse Communication: Mobility status;Weight bearing status;Patient requests pain meds  Activity Tolerance: Patient tolerated treatment well Patient left: in chair;with call bell/phone within reach;with chair alarm set;with family/visitor present;with nursing/sitter in room  OT Visit Diagnosis: Unsteadiness on feet (R26.81);Muscle weakness (generalized) (M62.81);Low  vision, both eyes (H54.2);Cognitive communication deficit (R41.841);Pain Pain - Right/Left: Left Pain - part of body: Ankle and joints of foot                Time: 9079-8984 OT Time Calculation (min): 55 min Charges:  OT General Charges $OT Visit: 1 Visit OT Evaluation $OT Eval Low Complexity: 1 Low OT Treatments $Self Care/Home Management : 38-52 mins  Shannelle Alguire OT/L Acute Rehabilitation Department  [phone]  02/11/2024, 12:19 PM

## 2024-02-12 ENCOUNTER — Other Ambulatory Visit (HOSPITAL_COMMUNITY): Payer: Self-pay

## 2024-02-12 DIAGNOSIS — A419 Sepsis, unspecified organism: Secondary | ICD-10-CM | POA: Diagnosis not present

## 2024-02-12 LAB — BASIC METABOLIC PANEL WITH GFR
Anion gap: 8 (ref 5–15)
BUN: 22 mg/dL (ref 8–23)
CO2: 21 mmol/L — ABNORMAL LOW (ref 22–32)
Calcium: 7.9 mg/dL — ABNORMAL LOW (ref 8.9–10.3)
Chloride: 111 mmol/L (ref 98–111)
Creatinine, Ser: 0.54 mg/dL (ref 0.44–1.00)
GFR, Estimated: >60 mL/min (ref >60)
Glucose, Bld: 99 mg/dL (ref 70–99)
Potassium: 3.1 mmol/L — ABNORMAL LOW (ref 3.5–5.1)
Sodium: 140 mmol/L (ref 135–145)

## 2024-02-12 LAB — CBC
HCT: 32.2 % — ABNORMAL LOW (ref 36.0–46.0)
Hemoglobin: 10.2 g/dL — ABNORMAL LOW (ref 12.0–15.0)
MCH: 32.2 pg (ref 26.0–34.0)
MCHC: 31.7 g/dL (ref 30.0–36.0)
MCV: 101.6 fL — ABNORMAL HIGH (ref 80.0–100.0)
Platelets: 140 10*3/uL — ABNORMAL LOW (ref 150–400)
RBC: 3.17 MIL/uL — ABNORMAL LOW (ref 3.87–5.11)
RDW: 15.1 % (ref 11.5–15.5)
WBC: 16.4 10*3/uL — ABNORMAL HIGH (ref 4.0–10.5)
nRBC: 0 % (ref 0.0–0.2)

## 2024-02-12 MED ORDER — AMOXICILLIN-POT CLAVULANATE 500-125 MG PO TABS
1.0000 | ORAL_TABLET | Freq: Two times a day (BID) | ORAL | 0 refills | Status: AC
  Filled 2024-02-12: qty 14, 7d supply, fill #0

## 2024-02-12 MED ORDER — AMOXICILLIN-POT CLAVULANATE 500-125 MG PO TABS
1.0000 | ORAL_TABLET | Freq: Two times a day (BID) | ORAL | Status: DC
  Administered 2024-02-12: 1 via ORAL
  Filled 2024-02-12: qty 1

## 2024-02-12 MED ORDER — POTASSIUM CHLORIDE CRYS ER 20 MEQ PO TBCR
40.0000 meq | EXTENDED_RELEASE_TABLET | Freq: Once | ORAL | Status: AC
  Administered 2024-02-12: 40 meq via ORAL
  Filled 2024-02-12: qty 2

## 2024-02-12 MED ORDER — POTASSIUM CHLORIDE CRYS ER 20 MEQ PO TBCR
40.0000 meq | EXTENDED_RELEASE_TABLET | Freq: Every day | ORAL | 0 refills | Status: AC
  Filled 2024-02-12: qty 6, 3d supply, fill #0

## 2024-02-12 NOTE — TOC Transition Note (Signed)
 Transition of Care [facility]) - Discharge Note   Patient Details  Name: Tanya Martinez MRN: [id number] Date of Birth: 1923/05/20  [facility]) CM/SW Contact:  Sheri ONEIDA Sharps, LCSW Phone Number: 02/12/2024, 11:24 AM   Clinical Narrative:    Pt medically ready to dc home with HHPT/OT through [facility]. HH information added to AVS. No further TOC needs.   Final next level of care: Home w Home Health Services Barriers to Discharge: Barriers Resolved   Patient Goals and CMS Choice Patient states their goals for this hospitalization and ongoing recovery are:: return home with [facility]   Choice offered to / list presented to : NA      Discharge Placement                       Discharge Plan and Services Additional resources added to the After Visit Summary for   In-house Referral: Clinical Social Work Discharge Planning Services: NA Post Acute Care Choice: Home Health, Durable Medical Equipment          DME Arranged: N/A DME Agency: NA       HH Arranged: PT, OT HH Agency: [facility] Date [facility] Agency Contacted: 02/12/24 Time HH Agency Contacted: 1124 Representative spoke with at [facility] Agency: [place]  Social Drivers of Health (SDOH) Interventions SDOH Screenings   Food Insecurity: No Food Insecurity (02/10/2024)  Housing: Low Risk  (02/10/2024)  Transportation Needs: No Transportation Needs (02/10/2024)  Utilities: Not At Risk (02/10/2024)  Physical Activity: Insufficiently Active (07/22/2017)  Social Connections: Unknown (02/10/2024)  Tobacco Use: Low Risk  (02/10/2024)     Readmission Risk Interventions    02/11/2024    2:45 PM  Readmission Risk Prevention Plan  Transportation Screening Complete  PCP or Specialist Appt within 5-7 Days Complete  Home Care Screening Complete  Medication Review (RN CM) Complete

## 2024-02-12 NOTE — Discharge Summary (Signed)
 Physician Discharge Summary  Tanya Martinez FMW:[id number] DOB: June 25, 1923 DOA: 02/10/2024  PCP: Burney Darice CROME, MD  Admit date: 02/10/2024 Discharge date: 02/12/2024  Admitted From: Home Disposition:  Home  Discharge Condition:Stable CODE STATUS: DNR Diet recommendation: Regular   Brief/Interim Summary: Patient is a 89 year old female with history of hypothyroidism, vitamin B12 deficiency, chronic back pain, GERD, endometrial cancer who presented for the evaluation of nausea, vomiting, watery stools.  On presentation, she was hemodynamically stable.  Lab work showed severe leukocytosis, elevated lactate, UA was suspicious for UTI.  CT abdomen/pelvis showed sigmoid diverticulosis with evidence of possible acute diverticulitis but no bowel obstruction and known posterior bladder mass.  Started on IV fluid, antibiotics.  Hospital course remained stable.  Patient tolerated the solid diet very well.  Does not complain of any dysuria, abdominal pain, nausea or vomiting.  Urine culture showed multiple species.  PT recommended home health.  Following problems were addressed during the hospitalization:  Sepsis secondary to acute diverticulitis: History of constipation, diverticulosis.  Presented with nausea, vomiting, watery stools, abdominal discomfort.  Severe leukocytosis, elevated lactate on presentation.  CT abdomen/pelvis suggestive of possible acute diverticulitis.  Started on ceftriaxone  and Flagyl .  Cultures have not show any growth. On examination, patient does not have any abdominal tenderness.  She denied any abdominal pain, nausea or vomiting this morning.  Tolerating soft diet.  Antibiotic changed to oral  Suspicion for UTI: Had nausea, vomiting, weakness on presentation.  UA suspicious for UTI.  Denies any dysuria.  Urine culture showed multiple species  Hypothyroidism: Continue Synthyroid   Endometrial cancer/bladder mass: CT abdomen/pelvis showed 2.1X 2.7 cm lesion arising from the  posterior bladder wall suspicious for malignancy.  This is a known bladder mass followed by urology, no plan for surgery. Patient also has history of endometrial cancer and has chronic vaginal bleeding   Generalized weakness: PT/OT consulted.  She lives alone.  Daughter lives nearby.  PT recommend home health     Discharge Diagnoses:  Principal Problem:   Sepsis (HCC) Active Problems:   Diverticulitis   Nausea vomiting and diarrhea   Bladder mass   Generalized weakness    Discharge Instructions  Discharge Instructions     Diet general   Complete by: As directed    Discharge instructions   Complete by: As directed    1)Please take prescribed medications as instructed 2)Follow up with your PCP in a week.   Increase activity slowly   Complete by: As directed       Allergies as of 02/12/2024       Reactions   Nystatin -triamcinolone  Other (See Comments)   Redness, burning in the external vaginal area, broke out all over bottom and vaginal external area.   Shellfish Allergy Nausea Only   Azithromycin  Nausea And Vomiting        Medication List     TAKE these medications    albuterol  108 (90 Base) MCG/ACT inhaler Commonly known as: VENTOLIN  HFA Inhale 1-2 puffs into the lungs every 4 (four) hours as needed for wheezing or shortness of breath.   amoxicillin-clavulanate 500-125 MG tablet Commonly known as: AUGMENTIN Take 1 tablet by mouth 2 (two) times daily for 7 days.   Caltrate 600+D Plus Minerals 600-800 MG-UNIT Tabs Take 1 tablet by mouth daily.   cyanocobalamin  1000 MCG/ML injection Commonly known as: VITAMIN B12 Inject 1,000 mcg into the muscle every 30 (thirty) days.   famotidine 40 MG tablet Commonly known as: PEPCID Take 40 mg by mouth  daily.   latanoprost  0.005 % ophthalmic solution Commonly known as: XALATAN  Place 1 drop into both eyes at bedtime.   levothyroxine  50 MCG tablet Commonly known as: SYNTHROID  Take 50 mcg by mouth daily before  breakfast.   ondansetron  4 MG disintegrating tablet Commonly known as: ZOFRAN -ODT Take 1 tablet (4 mg total) by mouth every 8 (eight) hours as needed for nausea or vomiting.   potassium chloride  SA 20 MEQ tablet Commonly known as: KLOR-CON  M Take 2 tablets (40 mEq total) by mouth daily for 3 doses. Start taking on: February 13, 2024        Allergies  Allergen Reactions   Nystatin -Triamcinolone  Other (See Comments)    Redness, burning in the external vaginal area, broke out all over bottom and vaginal external area.   Shellfish Allergy Nausea Only   Azithromycin  Nausea And Vomiting    Consultations: None   Procedures/Studies: CT ABDOMEN PELVIS W CONTRAST Result Date: 02/10/2024 CLINICAL DATA:  Abdominal pain. EXAM: CT ABDOMEN AND PELVIS WITH CONTRAST TECHNIQUE: Multidetector CT imaging of the abdomen and pelvis was performed using the standard protocol following bolus administration of intravenous contrast. RADIATION DOSE REDUCTION: This exam was performed according to the departmental dose-optimization program which includes automated exposure control, adjustment of the mA and/or kV according to patient size and/or use of iterative reconstruction technique. CONTRAST:  80mL OMNIPAQUE  IOHEXOL  300 MG/ML  SOLN COMPARISON:  CT abdomen pelvis dated 12/05/2023. FINDINGS: Evaluation of this exam is limited due to respiratory motion. Lower chest: Chronic interstitial coarsening and fibrosis. No intra-abdominal free air or free fluid. Hepatobiliary: The liver is unremarkable. No biliary dilatation. Small gallstone. No pericholecystic fluid. Pancreas: Unremarkable. No pancreatic ductal dilatation or surrounding inflammatory changes. Spleen: Normal in size without focal abnormality. Adrenals/Urinary Tract: The adrenal glands unremarkable. There is no hydronephrosis on either side. Bilateral parapelvic cysts. There is symmetric enhancement and excretion of contrast by both kidneys. Enhancing 2.1 x 2.7 cm  lesion arising from the posterior bladder wall suspicious for malignancy. Cystoscopy was previously recommended. Stomach/Bowel: Moderate size hiatal hernia. There is sigmoid diverticulosis. Mild perisigmoid haziness may be chronic. Correlation with clinical exam recommended to evaluate for possibility of acute diverticulitis. There is no bowel obstruction. The appendix is normal. Vascular/Lymphatic: Advanced aortoiliac atherosclerotic disease. The IVC is unremarkable. No portal venous gas. 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 No adenopathy. Reproductive: Hysterectomy.  No suspicious adnexal masses. Other: Mild diffuse subcutaneous edema. Musculoskeletal: Osteopenia with degenerative changes of the spine. Similar appearance of fracture of S1. No acute osseous pathology. IMPRESSION: 1. Sigmoid diverticulosis. Mild perisigmoid haziness may be chronic. Correlation with clinical exam recommended to evaluate for possibility of acute diverticulitis. No bowel obstruction. Normal appendix. 2. Cholelithiasis. 3. Moderate size hiatal hernia. 4. Enhancing 2.1 x 2.7 cm lesion arising from the posterior bladder wall suspicious for malignancy. Cystoscopy was previously recommended. 5.  Aortic Atherosclerosis (ICD10-I70.0). Electronically Signed   By: Vanetta Chou M.D.   On: 02/10/2024 19:12      Subjective: Patient seen and examined at bedside today.  Hemodynamically stable.  Comfortable lying in bed.  Eating her breakfast.  Denies any abdomen pain, nausea or vomiting.  Abdomen soft and nontender, has good bowel sounds.  Discharge planning discussed with daughter.  Discharge Exam: Vitals:   02/12/24 0432 02/12/24 0556  BP: (!) 93/41 (!) 105/52  Pulse: 61 (!) 55  Resp: 18   Temp: 99 F (37.2 C)  SpO2: 90%    Vitals:   02/11/24 0951 02/11/24 1945 02/12/24 0432 02/12/24 0556  BP:  112/65 (!) 93/41 (!) 105/52  Pulse:  (!) 57 61 (!) 55  Resp:  18  18   Temp:  98.7 F (37.1 C) 99 F (37.2 C)   TempSrc:  Oral Oral   SpO2: 96% 94% 90%   Weight:      Height:        General: Pt is alert, awake, not in acute distress, pleasant elderly female Cardiovascular: RRR, S1/S2 +, no rubs, no gallops Respiratory: CTA bilaterally, no wheezing, no rhonchi Abdominal: Soft, NT, ND, bowel sounds + Extremities: no edema, no cyanosis    The results of significant diagnostics from this hospitalization (including imaging, microbiology, ancillary and laboratory) are listed below for reference.     Microbiology: Recent Results (from the past 240 hours)  Urine Culture     Status: Abnormal   Collection Time: 02/10/24  1:33 PM   Specimen: Urine, Clean Catch  Result Value Ref Range Status   Specimen Description   Final    URINE, CLEAN CATCH Performed at [facility], [address] [place], [place] [address]    Special Requests   Final    NONE Performed at [facility], [address] [place], [place] [address]    Culture MULTIPLE SPECIES PRESENT, SUGGEST RECOLLECTION (A)  Final   Report Status 02/11/2024 FINAL  Final  Culture, blood (routine x 2)     Status: None (Preliminary result)   Collection Time: 02/10/24  6:20 PM   Specimen: BLOOD RIGHT FOREARM  Result Value Ref Range Status   Specimen Description   Final    BLOOD RIGHT FOREARM Performed at [facility], [address], [place], [place] [address]    Special Requests   Final    BOTTLES DRAWN AEROBIC AND ANAEROBIC Blood Culture adequate volume Performed at [facility], [address] [place], [place] [address]    Culture   Final    NO GROWTH 2 DAYS Performed at [facility], [address], [place], [place] [address]    Report Status PENDING  Incomplete  Culture, blood (routine x 2)     Status: None (Preliminary result)   Collection Time: 02/10/24  9:21 PM   Specimen: BLOOD RIGHT ARM  Result  Value Ref Range Status   Specimen Description   Final    BLOOD RIGHT ARM Performed at [facility], [address], [place], [place] [address]    Special Requests   Final    BOTTLES DRAWN AEROBIC AND ANAEROBIC Blood Culture results may not be optimal due to an inadequate volume of blood received in culture bottles Performed at [facility], [address] [place], [place] [address]    Culture   Final    NO GROWTH 2 DAYS Performed at [facility], [address] [place], [place] [address]    Report Status PENDING  Incomplete     Labs: BNP (last 3 results) No results for input(s): BNP in the last 8760 hours. Basic Metabolic Panel: Recent Labs  Lab 02/10/24 1600 02/11/24 0511 02/12/24 0819  NA 140 139 140  K 4.0 3.8 3.1*  CL 106 111 111  CO2 24 20* 21*  GLUCOSE 136* 94 99  BUN 23 22 22   CREATININE 0.71 0.70 0.54  CALCIUM  8.5* 7.8* 7.9*   Liver Function Tests: Recent  Labs  Lab 02/10/24 1600  AST 21  ALT 12  ALKPHOS 38  BILITOT 0.8  PROT 6.2*  ALBUMIN 3.6   Recent Labs  Lab 02/10/24 1600  LIPASE 30   No results for input(s): AMMONIA in the last 168 hours. CBC: Recent Labs  Lab 02/10/24 1418 02/11/24 0653 02/12/24 0819  WBC 49.8* 19.8* 16.4*  HGB 13.7 9.8* 10.2*  HCT 42.0 29.9* 32.2*  MCV 102.2* 99.3 101.6*  PLT 216 147* 140*   Cardiac Enzymes: No results for input(s): CKTOTAL, CKMB, CKMBINDEX, TROPONINI in the last 168 hours. BNP: Invalid input(s): POCBNP CBG: No results for input(s): GLUCAP in the last 168 hours. D-Dimer No results for input(s): DDIMER in the last 72 hours. Hgb A1c No results for input(s): HGBA1C in the last 72 hours. Lipid Profile No results for input(s): CHOL, HDL, LDLCALC, TRIG, CHOLHDL, LDLDIRECT in the last 72 hours. Thyroid  function studies No results for input(s): TSH, T4TOTAL, T3FREE, THYROIDAB in the last 72 hours.  Invalid input(s):  FREET3 Anemia work up No results for input(s): VITAMINB12, FOLATE, FERRITIN, TIBC, IRON, RETICCTPCT in the last 72 hours. Urinalysis    Component Value Date/Time   COLORURINE AMBER (A) 02/10/2024 1333   APPEARANCEUR CLOUDY (A) 02/10/2024 1333   LABSPEC 1.021 02/10/2024 1333   PHURINE 5.0 02/10/2024 1333   GLUCOSEU NEGATIVE 02/10/2024 1333   HGBUR LARGE (A) 02/10/2024 1333   BILIRUBINUR NEGATIVE 02/10/2024 1333   KETONESUR 5 (A) 02/10/2024 1333   PROTEINUR 100 (A) 02/10/2024 1333   UROBILINOGEN 0.2 03/05/2013 1143   NITRITE NEGATIVE 02/10/2024 1333   LEUKOCYTESUR NEGATIVE 02/10/2024 1333   Sepsis Labs Recent Labs  Lab 02/10/24 1418 02/11/24 0653 02/12/24 0819  WBC 49.8* 19.8* 16.4*   Microbiology Recent Results (from the past 240 hours)  Urine Culture     Status: Abnormal   Collection Time: 02/10/24  1:33 PM   Specimen: Urine, Clean Catch  Result Value Ref Range Status   Specimen Description   Final    URINE, CLEAN CATCH Performed at [facility], [address] [place], [place] [address]    Special Requests   Final    NONE Performed at [facility], [address] [place], [place] [address]    Culture MULTIPLE SPECIES PRESENT, SUGGEST RECOLLECTION (A)  Final   Report Status 02/11/2024 FINAL  Final  Culture, blood (routine x 2)     Status: None (Preliminary result)   Collection Time: 02/10/24  6:20 PM   Specimen: BLOOD RIGHT FOREARM  Result Value Ref Range Status   Specimen Description   Final    BLOOD RIGHT FOREARM Performed at [facility], [address], [place], [place] [address]    Special Requests   Final    BOTTLES DRAWN AEROBIC AND ANAEROBIC Blood Culture adequate volume Performed at [facility], [address], [place], [place] [address]    Culture   Final    NO GROWTH 2 DAYS Performed at [facility], [address], [place], [place] [address]     Report Status PENDING  Incomplete  Culture, blood (routine x 2)     Status: None (Preliminary result)   Collection Time: 02/10/24  9:21 PM   Specimen: BLOOD RIGHT ARM  Result Value Ref Range Status   Specimen Description   Final    BLOOD RIGHT ARM Performed at [facility], [address], [place], [place] [address]    Special Requests  Final    BOTTLES DRAWN AEROBIC AND ANAEROBIC Blood Culture results may not be optimal due to an inadequate volume of blood received in culture bottles Performed at [facility], [address], [place], [place] [address]    Culture   Final    NO GROWTH 2 DAYS Performed at [facility], [address] [place], [place] [address]    Report Status PENDING  Incomplete    Please note: You were cared for by a hospitalist during your hospital stay. Once you are discharged, your primary care physician will handle any further medical issues. Please note that NO REFILLS for any discharge medications will be authorized once you are discharged, as it is imperative that you return to your primary care physician (or establish a relationship with a primary care physician if you do not have one) for your post hospital discharge needs so that they can reassess your need for medications and monitor your lab values.    Time coordinating discharge: 40 minutes  SIGNED:   Ivonne Mustache, MD  [facility] 02/12/2024, 10:44 AM Pager [phone]  If 7PM-7AM, please contact night-coverage [email or web address] Password [email or web address]

## 2024-02-12 NOTE — Plan of Care (Addendum)
 VSS. No c/o pain. IV fluids discontinued per order. Urine remains red this shift. LBM prior to arrival. No acute events overnight.  Problem: Education: Goal: Knowledge of General Education information will improve Description: Including pain rating scale, medication(s)/side effects and non-pharmacologic comfort measures Outcome: Progressing   Problem: Health Behavior/Discharge Planning: Goal: Ability to manage health-related needs will improve Outcome: Progressing   Problem: Clinical Measurements: Goal: Ability to maintain clinical measurements within normal limits will improve Outcome: Progressing Goal: Will remain free from infection Outcome: Progressing   Problem: Safety: Goal: Ability to remain free from injury will improve Outcome: Progressing

## 2024-02-15 LAB — CULTURE, BLOOD (ROUTINE X 2)
Culture: NO GROWTH
Culture: NO GROWTH
Special Requests: ADEQUATE
